# Patient Record
Sex: Female | Born: 1970
Health system: Southern US, Community
[De-identification: ages and names within clinical notes are randomized; demographics above are authoritative.]

## PROBLEM LIST (undated history)

## (undated) DIAGNOSIS — F419 Anxiety disorder, unspecified: Secondary | ICD-10-CM

## (undated) DIAGNOSIS — G43909 Migraine, unspecified, not intractable, without status migrainosus: Secondary | ICD-10-CM

## (undated) DIAGNOSIS — R188 Other ascites: Secondary | ICD-10-CM

## (undated) DIAGNOSIS — K7290 Hepatic failure, unspecified without coma: Secondary | ICD-10-CM

## (undated) DIAGNOSIS — K766 Portal hypertension: Secondary | ICD-10-CM

## (undated) DIAGNOSIS — I10 Essential (primary) hypertension: Secondary | ICD-10-CM

## (undated) DIAGNOSIS — K7682 Hepatic encephalopathy: Secondary | ICD-10-CM

## (undated) DIAGNOSIS — R569 Unspecified convulsions: Secondary | ICD-10-CM

## (undated) HISTORY — DX: Essential (primary) hypertension: I10

## (undated) HISTORY — DX: Other ascites: R18.8

## (undated) HISTORY — DX: Portal hypertension: K76.6

## (undated) HISTORY — DX: Unspecified convulsions: R56.9

---

## 1998-08-23 ENCOUNTER — Emergency Department (HOSPITAL_COMMUNITY): Admission: EM | Admit: 1998-08-23 | Discharge: 1998-08-23 | Payer: Self-pay | Admitting: Emergency Medicine

## 1998-08-23 ENCOUNTER — Encounter: Payer: Self-pay | Admitting: Emergency Medicine

## 1998-11-10 ENCOUNTER — Other Ambulatory Visit: Admission: RE | Admit: 1998-11-10 | Discharge: 1998-11-10 | Payer: Self-pay | Admitting: *Deleted

## 1999-04-21 ENCOUNTER — Inpatient Hospital Stay (HOSPITAL_COMMUNITY): Admission: AD | Admit: 1999-04-21 | Discharge: 1999-04-24 | Payer: Self-pay | Admitting: Obstetrics and Gynecology

## 1999-11-03 ENCOUNTER — Other Ambulatory Visit: Admission: RE | Admit: 1999-11-03 | Discharge: 1999-11-03 | Payer: Self-pay | Admitting: Obstetrics and Gynecology

## 2000-04-13 ENCOUNTER — Observation Stay (HOSPITAL_COMMUNITY): Admission: AD | Admit: 2000-04-13 | Discharge: 2000-04-14 | Payer: Self-pay | Admitting: Obstetrics and Gynecology

## 2000-05-10 ENCOUNTER — Inpatient Hospital Stay (HOSPITAL_COMMUNITY): Admission: AD | Admit: 2000-05-10 | Discharge: 2000-05-12 | Payer: Self-pay | Admitting: Obstetrics and Gynecology

## 2000-07-26 ENCOUNTER — Other Ambulatory Visit: Admission: RE | Admit: 2000-07-26 | Discharge: 2000-07-26 | Payer: Self-pay | Admitting: Emergency Medicine

## 2001-07-16 ENCOUNTER — Encounter: Admission: RE | Admit: 2001-07-16 | Discharge: 2001-07-16 | Payer: Self-pay

## 2002-08-31 ENCOUNTER — Emergency Department (HOSPITAL_COMMUNITY): Admission: EM | Admit: 2002-08-31 | Discharge: 2002-09-01 | Payer: Self-pay | Admitting: Emergency Medicine

## 2002-08-31 ENCOUNTER — Encounter: Payer: Self-pay | Admitting: Emergency Medicine

## 2003-07-18 ENCOUNTER — Emergency Department (HOSPITAL_COMMUNITY): Admission: EM | Admit: 2003-07-18 | Discharge: 2003-07-18 | Payer: Self-pay | Admitting: Emergency Medicine

## 2006-03-29 ENCOUNTER — Encounter: Admission: RE | Admit: 2006-03-29 | Discharge: 2006-03-29 | Payer: Self-pay | Admitting: Gastroenterology

## 2007-08-31 ENCOUNTER — Emergency Department (HOSPITAL_COMMUNITY): Admission: EM | Admit: 2007-08-31 | Discharge: 2007-08-31 | Payer: Self-pay | Admitting: Emergency Medicine

## 2008-02-21 ENCOUNTER — Emergency Department (HOSPITAL_COMMUNITY): Admission: EM | Admit: 2008-02-21 | Discharge: 2008-02-21 | Payer: Self-pay | Admitting: Emergency Medicine

## 2008-04-26 ENCOUNTER — Emergency Department (HOSPITAL_COMMUNITY): Admission: EM | Admit: 2008-04-26 | Discharge: 2008-04-26 | Payer: Self-pay | Admitting: Family Medicine

## 2008-06-15 ENCOUNTER — Inpatient Hospital Stay (HOSPITAL_COMMUNITY): Admission: AD | Admit: 2008-06-15 | Discharge: 2008-06-15 | Payer: Self-pay | Admitting: Obstetrics & Gynecology

## 2008-08-05 ENCOUNTER — Ambulatory Visit (HOSPITAL_COMMUNITY): Admission: RE | Admit: 2008-08-05 | Discharge: 2008-08-05 | Payer: Self-pay | Admitting: Obstetrics and Gynecology

## 2008-12-18 ENCOUNTER — Inpatient Hospital Stay (HOSPITAL_COMMUNITY): Admission: AD | Admit: 2008-12-18 | Discharge: 2008-12-20 | Payer: Self-pay | Admitting: Obstetrics and Gynecology

## 2008-12-30 ENCOUNTER — Inpatient Hospital Stay (HOSPITAL_COMMUNITY): Admission: AD | Admit: 2008-12-30 | Discharge: 2008-12-30 | Payer: Self-pay | Admitting: Obstetrics and Gynecology

## 2009-07-16 ENCOUNTER — Ambulatory Visit (HOSPITAL_COMMUNITY): Admission: AD | Admit: 2009-07-16 | Discharge: 2009-07-16 | Payer: Self-pay | Admitting: Obstetrics and Gynecology

## 2010-01-01 ENCOUNTER — Emergency Department (HOSPITAL_COMMUNITY): Admission: EM | Admit: 2010-01-01 | Discharge: 2010-01-01 | Payer: Self-pay | Admitting: Emergency Medicine

## 2010-07-21 LAB — COMPREHENSIVE METABOLIC PANEL
ALT: 13 U/L (ref 0–35)
AST: 21 U/L (ref 0–37)
Albumin: 2 g/dL — ABNORMAL LOW (ref 3.5–5.2)
Albumin: 2.8 g/dL — ABNORMAL LOW (ref 3.5–5.2)
Alkaline Phosphatase: 96 U/L (ref 39–117)
Alkaline Phosphatase: 75 U/L (ref 39–117)
BUN: 7 mg/dL (ref 6–23)
BUN: 7 mg/dL (ref 6–23)
CO2: 24 mEq/L (ref 19–32)
CO2: 19 mEq/L (ref 19–32)
CO2: 19 mEq/L (ref 19–32)
Calcium: 9.1 mg/dL (ref 8.4–10.5)
Calcium: 9.8 mg/dL (ref 8.4–10.5)
Chloride: 102 mEq/L (ref 96–112)
Chloride: 105 mEq/L (ref 96–112)
Creatinine, Ser: 0.73 mg/dL (ref 0.4–1.2)
Creatinine, Ser: 1.15 mg/dL (ref 0.4–1.2)
GFR calc Af Amer: >60 mL/min (ref >60)
GFR calc non Af Amer: >60 mL/min (ref >60)
GFR calc non Af Amer: 53 mL/min — ABNORMAL LOW (ref >60)
Glucose, Bld: 92 mg/dL (ref 70–99)
Potassium: 3.6 mEq/L (ref 3.5–5.1)
Potassium: 3.5 mEq/L (ref 3.5–5.1)
Potassium: 3.7 mEq/L (ref 3.5–5.1)
Sodium: 135 mEq/L (ref 135–145)
Total Bilirubin: 0.8 mg/dL (ref 0.3–1.2)
Total Protein: 6.5 g/dL (ref 6.0–8.3)
Total Protein: 4.8 g/dL — ABNORMAL LOW (ref 6.0–8.3)

## 2010-07-21 LAB — CBC
HCT: 35.3 % — ABNORMAL LOW (ref 36.0–46.0)
HCT: 36.2 % (ref 36.0–46.0)
Hemoglobin: 12.9 g/dL (ref 12.0–15.0)
Hemoglobin: 10.4 g/dL — ABNORMAL LOW (ref 12.0–15.0)
Hemoglobin: 12.4 g/dL (ref 12.0–15.0)
MCV: 99.7 fL (ref 78.0–100.0)
Platelets: 200 10*3/uL (ref 150–400)
Platelets: 204 10*3/uL (ref 150–400)
Platelets: 263 10*3/uL (ref 150–400)
RBC: 3.83 MIL/uL — ABNORMAL LOW (ref 3.87–5.11)
RBC: 2.78 MIL/uL — ABNORMAL LOW (ref 3.87–5.11)
RDW: 12.6 % (ref 11.5–15.5)
RDW: 12.6 % (ref 11.5–15.5)
RDW: 12.7 % (ref 11.5–15.5)
WBC: 8.4 10*3/uL (ref 4.0–10.5)
WBC: 13.9 10*3/uL — ABNORMAL HIGH (ref 4.0–10.5)

## 2010-07-21 LAB — URINE MICROSCOPIC-ADD ON

## 2010-07-21 LAB — RPR: RPR Ser Ql: NONREACTIVE

## 2010-07-21 LAB — DIFFERENTIAL
Eosinophils Relative: 2 % (ref 0–5)
Lymphocytes Relative: 18 % (ref 12–46)
Monocytes Absolute: 0.5 10*3/uL (ref 0.1–1.0)
Monocytes Relative: 3 % (ref 3–12)
Neutrophils Relative %: 77 % (ref 43–77)

## 2010-07-21 LAB — CREATININE CLEARANCE, URINE, 24 HOUR
Creatinine, 24H Ur: 1857 mg/d — ABNORMAL HIGH (ref 700–1800)
Creatinine: 0.89 mg/dL (ref 0.40–1.20)
Urine Total Volume-CRCL: 3600 mL

## 2010-07-21 LAB — URINALYSIS, ROUTINE W REFLEX MICROSCOPIC
Protein, ur: NEGATIVE mg/dL
Protein, ur: NEGATIVE mg/dL
Urobilinogen, UA: 0.2 mg/dL (ref 0.0–1.0)
pH: 5.5 (ref 5.0–8.0)

## 2010-07-21 LAB — URINE CULTURE

## 2010-07-21 LAB — URINALYSIS, DIPSTICK ONLY
Hgb urine dipstick: NEGATIVE
Ketones, ur: NEGATIVE mg/dL
Leukocytes, UA: NEGATIVE
Nitrite: NEGATIVE
Protein, ur: 100 mg/dL — AB
Specific Gravity, Urine: 1.01 (ref 1.005–1.030)
Specific Gravity, Urine: 1.025 (ref 1.005–1.030)
pH: 5.5 (ref 5.0–8.0)
pH: 6 (ref 5.0–8.0)

## 2010-07-21 LAB — URIC ACID
Uric Acid, Serum: 7.8 mg/dL — ABNORMAL HIGH (ref 2.4–7.0)
Uric Acid, Serum: 7.9 mg/dL — ABNORMAL HIGH (ref 2.4–7.0)

## 2010-07-21 LAB — LACTATE DEHYDROGENASE
LDH: 132 U/L (ref 94–250)
LDH: 137 U/L (ref 94–250)

## 2010-07-21 LAB — PROTEIN, URINE, 24 HOUR: Urine Total Volume-UPROT: 3600 mL

## 2010-07-26 LAB — CHROMOSOME ANALYSIS, AMNIOTIC FLUID (PERF AT WFU)

## 2010-07-27 LAB — WET PREP, GENITAL

## 2010-07-27 LAB — CBC
HCT: 36.5 % (ref 36.0–46.0)
Platelets: 244 10*3/uL (ref 150–400)
WBC: 9.6 10*3/uL (ref 4.0–10.5)

## 2010-07-27 LAB — HCG, QUANTITATIVE, PREGNANCY: hCG, Beta Chain, Quant, S: 53002 m[IU]/mL — ABNORMAL HIGH (ref <5)

## 2010-07-27 LAB — URINALYSIS, ROUTINE W REFLEX MICROSCOPIC
Bilirubin Urine: NEGATIVE
Ketones, ur: NEGATIVE mg/dL
Nitrite: NEGATIVE
Protein, ur: NEGATIVE mg/dL
pH: 5 (ref 5.0–8.0)

## 2010-07-27 LAB — POCT PREGNANCY, URINE: Preg Test, Ur: POSITIVE

## 2010-07-27 LAB — GC/CHLAMYDIA PROBE AMP, GENITAL: Chlamydia, DNA Probe: NEGATIVE

## 2010-08-29 NOTE — Op Note (Signed)
NAMEMANASA, SPEASE         ACCOUNT NO.:  [id number]   MEDICAL RECORD NO.:  [id number]          PATIENT TYPE:  OUT   LOCATION:  ULT                           FACILITY:  [facility]   PHYSICIAN:  Janine Limbo, M.D.DATE OF BIRTH:  June 04, 1970   DATE OF PROCEDURE:  DATE OF DISCHARGE:                               OPERATIVE REPORT   PREOPERATIVE DIAGNOSES:  1. A 19-week gestation.  2. Age 40.  3. Abnormal serum genetic screening.   POSTOPERATIVE DIAGNOSES:  1. A 19-week gestation.  2. Age 23.  3. Abnormal serum genetic screening.   PROCEDURE:  Genetic amniocentesis.   SURGEON:  Janine Limbo, MD   FIRST ASSISTANT:  None.   ANESTHETIC:  Local Xylocaine.   DISPOSITION:  Ms. Nusbaum is a 40 year old female, gravida 4, para 2-  0-1-2, who presents at 62 weeks' gestation.  She has been followed at  the [facility] of [facility].  She had an abnormal serum genetic screen.  We discussed the alternatives  for management.  We reviewed the risk and benefits associated with all  of her options.  The patient elects to proceed with genetic  amniocentesis.  She accepts the risks of anesthetic complications,  bleeding, infections, possible damage to the surrounding organs,  possible rupture of membranes, and possible miscarriage (1 per 200).   FINDINGS:  The patient was noted to have a single intrauterine gestation  in a cephalic presentation.  The amniotic fluid volume was normal.  There was an anterior placenta that appeared normal.  The patient's  blood type is AB positive.   PROCEDURE:  The patient was seen in the ultrasound suite and an  ultrasound was performed.  The fetal heart motions were noted to be  normal.  An appropriate fluid pocket was identified.  The patient's  abdomen was prepped with multiple layers of Betadine and then sterilely  draped.  The abdomen was injected with 1.5 mL of 1% lidocaine.  The  spinal needle was  inserted into the amniotic cavity with the assistance  of ultrasound.  A 12 mL of clear amniotic fluid were obtained.  The  spinal needle was removed, and the ultrasound was repeated.  Again,  fetal heart motions were noted to be normal.  There was no evidence of  harm.  The fluid was sent to [facility] for genetic evaluation.  The patient was given a copy of the  postoperative instruction sheet for patients who have had amniocentesis.  She was told to call for bleeding, severe pain, or any problems.  She  will follow up in the office in 1 week.      Janine Limbo, M.D.  Electronically Signed     AVS/MEDQ  D:  08/05/2008  T:  08/05/2008  Job:  [id number]
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
## 2010-09-01 NOTE — H&P (Signed)
[facility]  Patient:    Julie Rowland, Julie Rowland                MRN: [id number] Adm. Date:  [id number] Attending:  Leonard Schwartz Dictator:   Miguel Dibble, C.N.M.                         History and Physical  ADDENDUM  DATE OF BIRTH:                1970-09-02  PRENATAL LABORATORY DATA:      (At entry into the practice on November 03, 1999). Hemoglobin 13.6, hematocrit 39.7, white count 11, platelet 329.  Hepatitis surface antigen negative.  Rubella antibodies present showing immunity.  RPR nonreactive.  Blood type AB positive.  Negative antibody screen.  HIV nonreactive.  Glucose challenge test at 28 weeks within normal limits. DD:  04/13/00 TD:  04/13/00 Job: [id number]

## 2010-09-01 NOTE — Discharge Summary (Signed)
[facility]  Patient:    Julie Rowland, Julie Rowland                MRN: [id number] Adm. Date:  [id number] Disc. Date: 04/14/00 Attending:  Leonard Schwartz Dictator:   Wynelle Bourgeois, P.A.                           Discharge Summary  DATE OF BIRTH:                April 02, 1971  ADMISSION DIAGNOSES:          1. Intrauterine pregnancy at 33-4/7 weeks, with                                  nausea, vomiting, and diarrhea (probable                                  gastroenteritis).                               2. Rule out preeclampsia.  DISCHARGE DIAGNOSES:          1. Intrauterine pregnancy at 33-4/7 weeks, with                                  nausea, vomiting, and diarrhea (probable                                  gastroenteritis).                               2. Rule out preeclampsia.  PROCEDURES:                   1. IV hydration.                               2. Twenty-four-hour urine collection.                               3. Electronic fetal monitoring.  HOSPITAL COURSE:              The patient is a 40 year old G2, P1, at 33-4/7 weeks who was admitted on April 13, 2000, with nausea, vomiting, and diarrhea x 24 hours.  She was also complaining of severe mid abdominal cramps and inability to tolerate clear fluids.  She reported positive fetal movement, denied any leaking or bleeding.  She was admitted for 23-hour observation. A catheterized UA specimen showed a specific gravity of 1.030, 30 mg of protein, and greater than 80 ketones.  Blood pressures were 130/86 with pulse of 86.  She was, therefore, admitted for IV hydration and support, while obtaining a 24-hour urine for creatinine clearance and 24-hour total protein. We also sent stool for ova and parasites and culture.  PIH laboratories were sent and were found to be within normal limits.  DTRs were brisk at 3 to 4+ with no clonus.  She was  medicated for pain with Stadol 2 mg  IV push for every three hours and Phenergan as needed for nausea and Imodium as needed for diarrhea.  By April 14, 2000, her symptoms had improved, and she was able to tolerate liquids.  Bedside ultrasound showed a single intrauterine pregnancy, cephalic presentation, with normal fluid, anterior grade 2 placenta, and normal fetal motion.  On the evening of April 14, 2000, her blood pressures were 129/84, 118/83, and 104/62.  She was afebrile, without complaints.  The laboratory was contacted and notified Dr. Stefano Gaul that the 24-hour urine results would not be available tonight.  The patient was offered discharge home and explained the risks of possibility that she may have preeclampsia and that this condition may get worse and may include seizures. She and her husband understood the risks and understood what symptoms to call for or come back in for and opted to be discharged home.  Dr. Stefano Gaul will call her with results of the 24-hour urine when they are available.  DISCHARGE MEDICATIONS:        Vicodin 1 p.o. q.4h. p.r.n. pain, #30 with one refill.  DISCHARGE LABORATORY DATA:    White blood cell count 13.9, hemoglobin 11.4, hematocrit 32.0, platelets 245.  Potassium 3.4, BUN 6, creatinine 0.7, AST 23, ALT 10, ALP 102, total bilirubin 0.3, LDH 104, uric acid 5.3.  DISCHARGE INSTRUCTIONS:       Per Dr. Stefano Gaul.  FOLLOW-UP:                    In one week at [facility] or p.r.n. D:  04/14/00 TD:  04/14/00 Job: [id number]

## 2011-01-10 LAB — ETHANOL: Alcohol, Ethyl (B): <5

## 2011-01-10 LAB — POCT CARDIAC MARKERS
CKMB, poc: 2
Myoglobin, poc: 164
Operator id: 284251
Troponin i, poc: <0.05

## 2011-01-10 LAB — DIFFERENTIAL
Lymphocytes Relative: 9 — ABNORMAL LOW
Lymphs Abs: 1.4
Neutrophils Relative %: 83 — ABNORMAL HIGH

## 2011-01-10 LAB — HEPATIC FUNCTION PANEL
Albumin: 4.1
Alkaline Phosphatase: 66
Total Protein: 7.2

## 2011-01-10 LAB — CBC
Platelets: 259
WBC: 15.4 — ABNORMAL HIGH

## 2011-05-08 ENCOUNTER — Emergency Department (HOSPITAL_COMMUNITY): Payer: Self-pay

## 2011-05-08 ENCOUNTER — Encounter (HOSPITAL_COMMUNITY): Payer: Self-pay | Admitting: Emergency Medicine

## 2011-05-08 ENCOUNTER — Emergency Department (INDEPENDENT_AMBULATORY_CARE_PROVIDER_SITE_OTHER)
Admission: EM | Admit: 2011-05-08 | Discharge: 2011-05-08 | Disposition: A | Discharge status: Nursing Home (Includes ICF) | Payer: Self-pay | Source: Home / Self Care | Attending: Emergency Medicine | Admitting: Emergency Medicine

## 2011-05-08 ENCOUNTER — Encounter (HOSPITAL_COMMUNITY): Payer: Self-pay | Admitting: *Deleted

## 2011-05-08 ENCOUNTER — Emergency Department (HOSPITAL_COMMUNITY)
Admission: EM | Admit: 2011-05-08 | Discharge: 2011-05-08 | Disposition: A | Discharge status: Home / Self Care | Payer: Self-pay | Attending: Emergency Medicine | Admitting: Emergency Medicine

## 2011-05-08 DIAGNOSIS — R110 Nausea: Secondary | ICD-10-CM | POA: Insufficient documentation

## 2011-05-08 DIAGNOSIS — F411 Generalized anxiety disorder: Secondary | ICD-10-CM | POA: Insufficient documentation

## 2011-05-08 DIAGNOSIS — Z79899 Other long term (current) drug therapy: Secondary | ICD-10-CM | POA: Insufficient documentation

## 2011-05-08 DIAGNOSIS — R1033 Periumbilical pain: Secondary | ICD-10-CM | POA: Insufficient documentation

## 2011-05-08 DIAGNOSIS — F17200 Nicotine dependence, unspecified, uncomplicated: Secondary | ICD-10-CM | POA: Insufficient documentation

## 2011-05-08 DIAGNOSIS — K429 Umbilical hernia without obstruction or gangrene: Secondary | ICD-10-CM

## 2011-05-08 DIAGNOSIS — G43909 Migraine, unspecified, not intractable, without status migrainosus: Secondary | ICD-10-CM | POA: Insufficient documentation

## 2011-05-08 HISTORY — DX: Anxiety disorder, unspecified: F41.9

## 2011-05-08 HISTORY — DX: Migraine, unspecified, not intractable, without status migrainosus: G43.909

## 2011-05-08 LAB — COMPREHENSIVE METABOLIC PANEL
ALT: 105 U/L — ABNORMAL HIGH (ref 0–35)
AST: 89 U/L — ABNORMAL HIGH (ref 0–37)
Albumin: 4 g/dL (ref 3.5–5.2)
CO2: 24 mEq/L (ref 19–32)
Chloride: 102 mEq/L (ref 96–112)
GFR calc non Af Amer: >90 mL/min (ref >90)
Potassium: 3.9 mEq/L (ref 3.5–5.1)
Sodium: 138 mEq/L (ref 135–145)
Total Bilirubin: 0.4 mg/dL (ref 0.3–1.2)

## 2011-05-08 LAB — CBC
Platelets: 219 10*3/uL (ref 150–400)
RBC: 4.61 MIL/uL (ref 3.87–5.11)
WBC: 6.8 10*3/uL (ref 4.0–10.5)

## 2011-05-08 LAB — POCT URINALYSIS DIP (DEVICE)
Glucose, UA: NEGATIVE mg/dL
Leukocytes, UA: NEGATIVE
Nitrite: NEGATIVE
Specific Gravity, Urine: <=1.005 (ref 1.005–1.030)
Urobilinogen, UA: 0.2 mg/dL (ref 0.0–1.0)

## 2011-05-08 MED ORDER — IOHEXOL 300 MG/ML  SOLN
40.0000 mL | Freq: Once | INTRAMUSCULAR | Status: AC | PRN
  Administered 2011-05-08: 40 mL via ORAL

## 2011-05-08 MED ORDER — OXYCODONE-ACETAMINOPHEN 5-325 MG PO TABS: 1.0000 | ORAL_TABLET | ORAL | Status: AC | PRN

## 2011-05-08 MED ORDER — IOHEXOL 300 MG/ML  SOLN
100.0000 mL | Freq: Once | INTRAMUSCULAR | Status: AC | PRN
  Administered 2011-05-08: 100 mL via INTRAVENOUS

## 2011-05-08 MED ORDER — HYDROMORPHONE HCL PF 1 MG/ML IJ SOLN
1.0000 mg | Freq: Once | INTRAMUSCULAR | Status: AC
  Administered 2011-05-08: 1 mg via INTRAVENOUS
  Filled 2011-05-08: qty 1

## 2011-05-08 MED ORDER — PROMETHAZINE HCL 25 MG PO TABS: 25.0000 mg | ORAL_TABLET | Freq: Four times a day (QID) | ORAL | Status: AC | PRN

## 2011-05-08 MED ORDER — ONDANSETRON HCL 4 MG/2ML IJ SOLN
4.0000 mg | Freq: Once | INTRAMUSCULAR | Status: AC
  Administered 2011-05-08: 4 mg via INTRAVENOUS
  Filled 2011-05-08: qty 2

## 2011-05-08 NOTE — ED Notes (Signed)
Pt with abd pain since Sat. Pt states that her pain is just getting worse and worse.

## 2011-05-08 NOTE — ED Notes (Signed)
Pt reports  Onset 3 days ago constant  umbilical pain.   She denies fever, N, V or D.  Her last BM was today and was her normal.

## 2011-05-08 NOTE — ED Notes (Signed)
Pt from [facility] c/o umbilical pain with possible hernia x several days; pt sent from [facility] for further eval

## 2011-05-08 NOTE — ED Provider Notes (Addendum)
History     CSN: [id number]  Arrival date & time 05/08/11  1537   First MD Initiated Contact with Patient 05/08/11 1556      Chief Complaint  Patient presents with  . Abdominal Pain    (Consider location/radiation/quality/duration/timing/severity/associated sxs/prior treatment) HPI Comments: It has been 3 days, been having this severe pain, its getting much worse, "please don't touch me there", It really hurts" No fevers, No vomiting, No injury  Patient is a 41 y.o. female presenting with abdominal pain. The history is provided by the patient.  Abdominal Pain The primary symptoms of the illness include abdominal pain. The primary symptoms of the illness do not include fever, fatigue, nausea, vomiting, diarrhea, dysuria, vaginal discharge or vaginal bleeding. The current episode started more than 2 days ago. The onset of the illness was sudden. The problem has been gradually worsening.  The patient states that she believes she is currently not pregnant. Symptoms associated with the illness do not include anorexia, diaphoresis, heartburn, constipation, hematuria or frequency.    Past Medical History  Diagnosis Date  . Anxiety   . Migraine headache     History reviewed. No pertinent past surgical history.  Family History  Problem Relation Age of Onset  . Hypertension Mother   . Cancer Mother   . Diabetes Mother     History  Substance Use Topics  . Smoking status: Current Everyday Smoker -- 20 years    Types: Cigarettes  . Smokeless tobacco: Not on file  . Alcohol Use: Yes     occasionally    OB History    Grav Para Term Preterm Abortions TAB SAB Ect Mult Living                  Review of Systems  Constitutional: Negative for fever, diaphoresis and fatigue.  Respiratory: Negative for cough.   Cardiovascular: Negative for chest pain.  Gastrointestinal: Positive for abdominal pain. Negative for heartburn, nausea, vomiting, diarrhea, constipation, abdominal  distention and anorexia.  Genitourinary: Negative for dysuria, frequency, hematuria, vaginal bleeding and vaginal discharge.    Allergies  Review of patient's allergies indicates no known allergies.  Home Medications   Current Outpatient Rx  Name Route Sig Dispense Refill  . ALPRAZOLAM 1 MG PO TABS Oral Take 1 mg by mouth 3 (three) times daily as needed.    Marland Kitchen BISMUTH SUBSALICYLATE 262 MG/15ML PO SUSP Oral Take 15 mLs by mouth every 6 (six) hours as needed.    . CYCLOBENZAPRINE HCL 10 MG PO TABS Oral Take 10 mg by mouth 3 (three) times daily as needed.      BP 146/97  Pulse 88  Temp(Src) 97.5 F (36.4 C) (Oral)  Resp 16  SpO2 97%  LMP 05/04/2011  Physical Exam  Nursing note and vitals reviewed. Eyes: No scleral icterus.  Neck: Neck supple.  Abdominal: Soft. She exhibits no mass. There is tenderness in the periumbilical area. There is no rebound and no guarding. A hernia is present.    Skin: No rash noted. She is not diaphoretic.    ED Course  Procedures (including critical care time)   Labs Reviewed  POCT URINALYSIS DIP (DEVICE)  POCT URINALYSIS DIPSTICK   No results found.   No diagnosis found.    MDM  Suspected incarcerated umbilical hernia-        Jimmie Molly, MD 05/08/11 2536  Jimmie Molly, MD 05/08/11 [phone]

## 2011-05-08 NOTE — ED Provider Notes (Signed)
History     CSN: [id number]  Arrival date & time 05/08/11  1644   First MD Initiated Contact with Patient 05/08/11 2009      Chief Complaint  Patient presents with  . Abdominal Pain  . Hernia    The history is provided by the patient.   the patient reports several days of mild periumbilical abdominal pain.  She's had nausea without vomiting.  She's had no diarrhea.  She denies fevers or chills.  She denies flank pain.  She denies dysuria or urinary frequency.  She denies rash.  She reports she's never had pain like this before.  She denies vaginal discharge or vaginal bleeding at this time.  Nothing worsens her symptoms.  Nothing improves her symptoms.  Her symptoms are constant.  Her symptoms are moderate in severity.  Past Medical History  Diagnosis Date  . Anxiety   . Migraine headache     History reviewed. No pertinent past surgical history.  Family History  Problem Relation Age of Onset  . Hypertension Mother   . Cancer Mother   . Diabetes Mother     History  Substance Use Topics  . Smoking status: Current Everyday Smoker -- 20 years    Types: Cigarettes  . Smokeless tobacco: Not on file  . Alcohol Use: Yes     occasionally    OB History    Grav Para Term Preterm Abortions TAB SAB Ect Mult Living                  Review of Systems  All other systems reviewed and are negative.    Allergies  Review of patient's allergies indicates no known allergies.  Home Medications   Current Outpatient Rx  Name Route Sig Dispense Refill  . ALPRAZOLAM 1 MG PO TABS Oral Take 1 mg by mouth 3 (three) times daily as needed.    Marland Kitchen BISMUTH SUBSALICYLATE 262 MG/15ML PO SUSP Oral Take 15 mLs by mouth every 6 (six) hours as needed.    . CYCLOBENZAPRINE HCL 10 MG PO TABS Oral Take 10 mg by mouth 3 (three) times daily as needed. For headache per patient    . OXYCODONE-ACETAMINOPHEN 5-325 MG PO TABS Oral Take 1 tablet by mouth every 4 (four) hours as needed for pain. 20 tablet 0    . PROMETHAZINE HCL 25 MG PO TABS Oral Take 1 tablet (25 mg total) by mouth every 6 (six) hours as needed for nausea. 12 tablet 0    BP 145/95  Pulse 86  Temp(Src) 97.6 F (36.4 C) (Oral)  Resp 16  SpO2 95%  LMP 05/04/2011  Physical Exam  Nursing note and vitals reviewed. Constitutional: She is oriented to person, place, and time. She appears well-developed and well-nourished. No distress.  HENT:  Head: Normocephalic and atraumatic.  Eyes: EOM are normal.  Neck: Normal range of motion.  Cardiovascular: Normal rate, regular rhythm and normal heart sounds.   Pulmonary/Chest: Effort normal and breath sounds normal.  Abdominal: Soft. She exhibits no distension.       Mild umbilical tenderness on exam without guarding or rebound.  No peritonitis on exam.  No tenderness in the right upper quadrant.  No obvious hernia noted on exam  Musculoskeletal: Normal range of motion.  Neurological: She is alert and oriented to person, place, and time.  Skin: Skin is warm and dry.  Psychiatric: She has a normal mood and affect. Judgment normal.    ED Course  Procedures (including critical  care time)  Labs Reviewed  CBC - Abnormal; Notable for the following:    Hemoglobin 15.4 (*)    All other components within normal limits  COMPREHENSIVE METABOLIC PANEL - Abnormal; Notable for the following:    AST 89 (*)    ALT 105 (*)    All other components within normal limits  LIPASE, BLOOD   Ct Abdomen Pelvis W Contrast  05/08/2011  *RADIOLOGY REPORT*  Clinical Data: Persistent mid to lower abdominal pain.  CT ABDOMEN AND PELVIS WITH CONTRAST  Technique:  Multidetector CT imaging of the abdomen and pelvis was performed following the standard protocol during bolus administration of intravenous contrast.  Contrast: 100 mL of Omnipaque 300 IV contrast  Comparison: CT of the abdomen and pelvis performed 03/29/2006  Findings: Minimal bibasilar atelectasis is noted.  There is diffuse fatty infiltration  within the liver, with small areas of focal fatty sparing.  The liver is otherwise unremarkable in appearance.  The spleen is within normal limits.  The gallbladder is unremarkable.  The pancreas and adrenal glands are within normal limits.  The kidneys are unremarkable in appearance.  There is no evidence of hydronephrosis.  No renal or ureteral stones are seen.  No perinephric stranding is appreciated.  No free fluid is identified.  The small bowel is unremarkable in appearance.  The stomach is filled with contrast and is within normal limits.  No acute vascular abnormalities are seen.  The appendix is normal in caliber and contains minimal air, without evidence for appendicitis.  The colon is largely decompressed and is unremarkable in appearance.  The bladder is mildly distended and grossly unremarkable in appearance.  The uterus is within normal limits; an intrauterine device is noted in expected position at the fundus of the uterus. The ovaries are relatively symmetric; no suspicious adnexal masses are seen.  No inguinal lymphadenopathy is seen.  No acute osseous abnormalities are identified.  There is a chronic right-sided pars defect at L5, without evidence of anterolisthesis.  IMPRESSION:  1.  No acute abnormalities seen within the abdomen or pelvis. 2.  Diffuse fatty infiltration within the liver. 3.  Chronic right-sided pars defect at L5, without evidence of anterolisthesis.  Original Report Authenticated By: Tonia Ghent, M.D.     1. Abdominal pain       MDM  The patient is well-appearing.  Her urine obtained it urgent care was normal.  Her laboratory studies are without significant abnormality here.  There is a nonspecific change in her LFTs.  She'll followup with her primary care doctor regarding these.  Her CT of her abdomen and pelvis is normal.  The patient feels that she better at the time of discharge we'll follow up closely with her primary care Dr.        Lyanne Co,  MD 05/09/11 [phone]

## 2011-05-08 NOTE — ED Notes (Signed)
Patient states she has had abdominal pain x 2 days. Patient was seen at Urgent Care and was sent to [facility]. Urgent Care suspects possible umbilicus hernia. Patient denies chest pain, N/V or fever.

## 2012-10-09 ENCOUNTER — Ambulatory Visit: Payer: Self-pay | Attending: Family Medicine | Admitting: Family Medicine

## 2012-10-09 VITALS — BP 137/89 | HR 71 | Temp 98.6°F | Resp 18 | Ht 70.0 in | Wt 189.4 lb

## 2012-10-09 DIAGNOSIS — F341 Dysthymic disorder: Secondary | ICD-10-CM | POA: Insufficient documentation

## 2012-10-09 DIAGNOSIS — M545 Low back pain, unspecified: Secondary | ICD-10-CM | POA: Insufficient documentation

## 2012-10-09 DIAGNOSIS — G40909 Epilepsy, unspecified, not intractable, without status epilepticus: Secondary | ICD-10-CM | POA: Insufficient documentation

## 2012-10-09 DIAGNOSIS — I10 Essential (primary) hypertension: Secondary | ICD-10-CM | POA: Insufficient documentation

## 2012-10-09 DIAGNOSIS — F329 Major depressive disorder, single episode, unspecified: Secondary | ICD-10-CM

## 2012-10-09 DIAGNOSIS — G8929 Other chronic pain: Secondary | ICD-10-CM | POA: Insufficient documentation

## 2012-10-09 LAB — COMPREHENSIVE METABOLIC PANEL
Albumin: 4.2 g/dL (ref 3.5–5.2)
Alkaline Phosphatase: 86 U/L (ref 39–117)
BUN: 6 mg/dL (ref 6–23)
CO2: 24 mEq/L (ref 19–32)
Glucose, Bld: 85 mg/dL (ref 70–99)
Total Bilirubin: 0.7 mg/dL (ref 0.3–1.2)

## 2012-10-09 LAB — CBC
HCT: 41.5 % (ref 36.0–46.0)
Hemoglobin: 14.6 g/dL (ref 12.0–15.0)
MCV: 92.4 fL (ref 78.0–100.0)
RDW: 13.6 % (ref 11.5–15.5)
WBC: 9.6 10*3/uL (ref 4.0–10.5)

## 2012-10-09 LAB — LIPID PANEL
HDL: 67 mg/dL (ref >39)
LDL Cholesterol: 130 mg/dL — ABNORMAL HIGH (ref 0–99)
Triglycerides: 121 mg/dL (ref <150)

## 2012-10-09 LAB — TSH: TSH: 1.11 u[IU]/mL (ref 0.350–4.500)

## 2012-10-09 MED ORDER — PROPRANOLOL HCL 40 MG PO TABS: 40.0000 mg | ORAL_TABLET | Freq: Two times a day (BID) | ORAL | Status: DC

## 2012-10-09 MED ORDER — CARBAMAZEPINE 200 MG PO TABS: 200.0000 mg | ORAL_TABLET | Freq: Two times a day (BID) | ORAL | Status: DC

## 2012-10-09 MED ORDER — CYCLOBENZAPRINE HCL 10 MG PO TABS: 10.0000 mg | ORAL_TABLET | Freq: Two times a day (BID) | ORAL | Status: DC | PRN

## 2012-10-09 MED ORDER — GABAPENTIN 300 MG PO CAPS: 300.0000 mg | ORAL_CAPSULE | Freq: Two times a day (BID) | ORAL | Status: DC

## 2012-10-09 MED ORDER — HYDROCHLOROTHIAZIDE 12.5 MG PO CAPS: 12.5000 mg | ORAL_CAPSULE | Freq: Every day | ORAL | Status: DC

## 2012-10-09 NOTE — Patient Instructions (Addendum)
DASH Diet The DASH diet stands for "Dietary Approaches to Stop Hypertension." It is a healthy eating plan that has been shown to reduce high blood pressure (hypertension) in as little as 14 days, while also possibly providing other significant health benefits. These other health benefits include reducing the risk of breast cancer after menopause and reducing the risk of type 2 diabetes, heart disease, colon cancer, and stroke. Health benefits also include weight loss and slowing kidney failure in patients with chronic kidney disease.  DIET GUIDELINES  Limit salt (sodium). Your diet should contain less than 1500 mg of sodium daily.  Limit refined or processed carbohydrates. Your diet should include mostly whole grains. Desserts and added sugars should be used sparingly.  Include small amounts of heart-healthy fats. These types of fats include nuts, oils, and tub margarine. Limit saturated and trans fats. These fats have been shown to be harmful in the body. CHOOSING FOODS  The following food groups are based on a 2000 calorie diet. See your Registered Dietitian for individual calorie needs. Grains and Grain Products (6 to 8 servings daily)  Eat More Often: Whole-wheat bread, brown rice, whole-grain or wheat pasta, quinoa, popcorn without added fat or salt (air popped).  Eat Less Often: White bread, white pasta, white rice, cornbread. Vegetables (4 to 5 servings daily)  Eat More Often: Fresh, frozen, and canned vegetables. Vegetables may be raw, steamed, roasted, or grilled with a minimal amount of fat.  Eat Less Often/Avoid: Creamed or fried vegetables. Vegetables in a cheese sauce. Fruit (4 to 5 servings daily)  Eat More Often: All fresh, canned (in natural juice), or frozen fruits. Dried fruits without added sugar. One hundred percent fruit juice ( cup [237 mL] daily).  Eat Less Often: Dried fruits with added sugar. Canned fruit in light or heavy syrup. Lean Meats, Fish, and Poultry (2  servings or less daily. One serving is 3 to 4 oz [85-114 g]).  Eat More Often: Ninety percent or leaner ground beef, tenderloin, sirloin. Round cuts of beef, chicken breast, turkey breast. All fish. Grill, bake, or broil your meat. Nothing should be fried.  Eat Less Often/Avoid: Fatty cuts of meat, turkey, or chicken leg, thigh, or wing. Fried cuts of meat or fish. Dairy (2 to 3 servings)  Eat More Often: Low-fat or fat-free milk, low-fat plain or light yogurt, reduced-fat or part-skim cheese.  Eat Less Often/Avoid: Milk (whole, 2%).Whole milk yogurt. Full-fat cheeses. Nuts, Seeds, and Legumes (4 to 5 servings per week)  Eat More Often: All without added salt.  Eat Less Often/Avoid: Salted nuts and seeds, canned beans with added salt. Fats and Sweets (limited)  Eat More Often: Vegetable oils, tub margarines without trans fats, sugar-free gelatin. Mayonnaise and salad dressings.  Eat Less Often/Avoid: Coconut oils, palm oils, butter, stick margarine, cream, half and half, cookies, candy, pie. FOR MORE INFORMATION The Dash Diet Eating Plan: www.dashdiet.org Document Released: 03/22/2011 Document Revised: 06/25/2011 Document Reviewed: 03/22/2011 [employer] Patient Information 2014 [employer]. Hypertension As your heart beats, it forces blood through your arteries. This force is your blood pressure. If the pressure is too high, it is called hypertension (HTN) or high blood pressure. HTN is dangerous because you may have it and not know it. High blood pressure may mean that your heart has to work harder to pump blood. Your arteries may be narrow or stiff. The extra work puts you at risk for heart disease, stroke, and other problems.  Blood pressure consists of two numbers, a   higher number over a lower, 110/72, for example. It is stated as "110 over 72." The ideal is below 120 for the top number (systolic) and under 80 for the bottom (diastolic). Write down your blood pressure today. You  should pay close attention to your blood pressure if you have certain conditions such as:  Heart failure.  Prior heart attack.  Diabetes  Chronic kidney disease.  Prior stroke.  Multiple risk factors for heart disease. To see if you have HTN, your blood pressure should be measured while you are seated with your arm held at the level of the heart. It should be measured at least twice. A one-time elevated blood pressure reading (especially in the Emergency Department) does not mean that you need treatment. There may be conditions in which the blood pressure is different between your right and left arms. It is important to see your caregiver soon for a recheck. Most people have essential hypertension which means that there is not a specific cause. This type of high blood pressure may be lowered by changing lifestyle factors such as:  Stress.  Smoking.  Lack of exercise.  Excessive weight.  Drug/tobacco/alcohol use.  Eating less salt. Most people do not have symptoms from high blood pressure until it has caused damage to the body. Effective treatment can often prevent, delay or reduce that damage. TREATMENT  When a cause has been identified, treatment for high blood pressure is directed at the cause. There are a large number of medications to treat HTN. These fall into several categories, and your caregiver will help you select the medicines that are best for you. Medications may have side effects. You should review side effects with your caregiver. If your blood pressure stays high after you have made lifestyle changes or started on medicines,   Your medication(s) may need to be changed.  Other problems may need to be addressed.  Be certain you understand your prescriptions, and know how and when to take your medicine.  Be sure to follow up with your caregiver within the time frame advised (usually within two weeks) to have your blood pressure rechecked and to review your  medications.  If you are taking more than one medicine to lower your blood pressure, make sure you know how and at what times they should be taken. Taking two medicines at the same time can result in blood pressure that is too low. SEEK IMMEDIATE MEDICAL CARE IF:  You develop a severe headache, blurred or changing vision, or confusion.  You have unusual weakness or numbness, or a faint feeling.  You have severe chest or abdominal pain, vomiting, or breathing problems. MAKE SURE YOU:   Understand these instructions.  Will watch your condition.  Will get help right away if you are not doing well or get worse. Document Released: 04/02/2005 Document Revised: 06/25/2011 Document Reviewed: 11/21/2007 [facility] Patient Information 2014 [place], [place]. Back Exercises Back exercises help treat and prevent back injuries. The goal is to increase your strength in your belly (abdominal) and back muscles. These exercises can also help with flexibility. Start these exercises when told by your doctor. HOME CARE Back exercises include: Pelvic Tilt.  Lie on your back with your knees bent. Tilt your pelvis until the lower part of your back is against the floor. Hold this position 5 to 10 sec. Repeat this exercise 5 to 10 times. Knee to Chest.  Pull 1 knee up against your chest and hold for 20 to 30 seconds. Repeat this  with the other knee. This may be done with the other leg straight or bent, whichever feels better. Then, pull both knees up against your chest. Sit-Ups or Curl-Ups.  Bend your knees 90 degrees. Start with tilting your pelvis, and do a partial, slow sit-up. Only lift your upper half 30 to 45 degrees off the floor. Take at least 2 to 3 seonds for each sit-up. Do not do sit-ups with your knees out straight. If partial sit-ups are difficult, simply do the above but with only tightening your belly (abdominal) muscles and holding it as told. Hip-Lift.  Lie on your back with your knees flexed  90 degrees. Push down with your feet and shoulders as you raise your hips 2 inches off the floor. Hold for 10 seconds, repeat 5 to 10 times. Back Arches.  Lie on your stomach. Prop yourself up on bent elbows. Slowly press on your hands, causing an arch in your low back. Repeat 3 to 5 times. Shoulder-Lifts.  Lie face down with arms beside your body. Keep hips and belly pressed to floor as you slowly lift your head and shoulders off the floor. Do not overdo your exercises. Be careful in the beginning. Exercises may cause you some mild back discomfort. If the pain lasts for more than 15 minutes, stop the exercises until you see your doctor. Improvement with exercise for back problems is slow.  Document Released: 05/05/2010 Document Revised: 06/25/2011 Document Reviewed: 02/01/2011 [facility] Patient Information 2014 [place], [place]. Back Injury Prevention Back injuries can be extremely painful and difficult to heal. After having one back injury, you are much more likely to experience another later on. It is important to learn how to avoid injuring or re-injuring your back. The following tips can help you to prevent a back injury. PHYSICAL FITNESS  Exercise regularly and try to develop good tone in your abdominal muscles. Your abdominal muscles provide a lot of the support needed by your back.  Do aerobic exercises (walking, jogging, biking, swimming) regularly.  Do exercises that increase balance and strength (tai chi, yoga) regularly. This can decrease your risk of falling and injuring your back.  Stretch before and after exercising.  Maintain a healthy weight. The more you weigh, the more stress is placed on your back. For every pound of weight, 10 times that amount of pressure is placed on the back. DIET  Talk to your caregiver about how much calcium and vitamin D you need per day. These nutrients help to prevent weakening of the bones (osteoporosis). Osteoporosis can cause broken (fractured)  bones that lead to back pain.  Include good sources of calcium in your diet, such as dairy products, green, leafy vegetables, and products with calcium added (fortified).  Include good sources of vitamin D in your diet, such as milk and foods that are fortified with vitamin D.  Consider taking a nutritional supplement or a multivitamin if needed.  Stop smoking if you smoke. POSTURE  Sit and stand up straight. Avoid leaning forward when you sit or hunching over when you stand.  Choose chairs with good low back (lumbar) support.  If you work at a desk, sit close to your work so you do not need to lean over. Keep your chin tucked in. Keep your neck drawn back and elbows bent at a right angle. Your arms should look like the letter "L."  Sit high and close to the steering wheel when you drive. Add a lumbar support to your car seat if needed.  Avoid sitting or standing in one position for too long. Take breaks to get up, stretch, and walk around at least once every hour. Take breaks if you are driving for long periods of time.  Sleep on your side with your knees slightly bent, or sleep on your back with a pillow under your knees. Do not sleep on your stomach. LIFTING, TWISTING, AND REACHING  Avoid heavy lifting, especially repetitive lifting. If you must do heavy lifting:  Stretch before lifting.  Work slowly.  Rest between lifts.  Use carts and dollies to move objects when possible.  Make several small trips instead of carrying 1 heavy load.  Ask for help when you need it.  Ask for help when moving big, awkward objects.  Follow these steps when lifting:  Stand with your feet shoulder-width apart.  Get as close to the object as you can. Do not try to pick up heavy objects that are far from your body.  Use handles or lifting straps if they are available.  Bend at your knees. Squat down, but keep your heels off the floor.  Keep your shoulders pulled back, your chin tucked  in, and your back straight.  Lift the object slowly, tightening the muscles in your legs, abdomen, and buttocks. Keep the object as close to the center of your body as possible.  When you put a load down, use these same guidelines in reverse.  Do not:  Lift the object above your waist.  Twist at the waist while lifting or carrying a load. Move your feet if you need to turn, not your waist.  Bend over without bending at your knees.  Avoid reaching over your head, across a table, or for an object on a high surface. OTHER TIPS  Avoid wet floors and keep sidewalks clear of ice to prevent falls.  Do not sleep on a mattress that is too soft or too hard.  Keep items that are used frequently within easy reach.  Put heavier objects on shelves at waist level and lighter objects on lower or higher shelves.  Find ways to decrease your stress, such as exercise, massage, or relaxation techniques. Stress can build up in your muscles. Tense muscles are more vulnerable to injury.  Seek treatment for depression or anxiety if needed. These conditions can increase your risk of developing back pain. SEEK MEDICAL CARE IF:  You injure your back.  You have questions about diet, exercise, or other ways to prevent back injuries. MAKE SURE YOU:  Understand these instructions.  Will watch your condition.  Will get help right away if you are not doing well or get worse. Document Released: 05/10/2004 Document Revised: 06/25/2011 Document Reviewed: 05/14/2011 [facility] Patient Information 2014 [place], [place].

## 2012-10-09 NOTE — Progress Notes (Signed)
Patient here to establish care Has history of seizures and HTN

## 2012-10-09 NOTE — Progress Notes (Signed)
Patient ID: Julie Rowland, female   DOB: March 13, 1971, 42 y.o.   MRN: [id number]  CC: establish  HPI: Pt is here to establish.  Pt. Has a history of seizures but hasn't seen a neurologist yet.  Pt has some depression but is seeing psychiatrist. Pt was recently diagnosed with hypertension.    No Known Allergies Past Medical History  Diagnosis Date  . Anxiety   . Migraine headache   . Seizures   . Hypertension    Current Outpatient Prescriptions on File Prior to Visit  Medication Sig Dispense Refill  . ALPRAZolam (XANAX) 1 MG tablet Take 1 mg by mouth 3 (three) times daily as needed.      . bismuth subsalicylate (PEPTO BISMOL) 262 MG/15ML suspension Take 15 mLs by mouth every 6 (six) hours as needed.      . cyclobenzaprine (FLEXERIL) 10 MG tablet Take 10 mg by mouth 3 (three) times daily as needed. For headache per patient       No current facility-administered medications on file prior to visit.   Family History  Problem Relation Age of Onset  . Hypertension Mother   . Cancer Mother   . Diabetes Mother    History   Social History  . Marital Status: Married    Spouse Name: N/A    Number of Children: N/A  . Years of Education: N/A   Occupational History  . Not on file.   Social History Main Topics  . Smoking status: Current Every Day Smoker -- 20 years    Types: Cigarettes  . Smokeless tobacco: Not on file  . Alcohol Use: Yes     Comment: occasionally  . Drug Use: No  . Sexually Active: Yes    Birth Control/ Protection: IUD   Other Topics Concern  . Not on file   Social History Narrative  . No narrative on file    Review of Systems  Constitutional: Negative for fever, chills, diaphoresis, activity change, appetite change and fatigue.  HENT: Negative for ear pain, nosebleeds, congestion, facial swelling, rhinorrhea, neck pain, neck stiffness and ear discharge.   Eyes: Negative for pain, discharge, redness, itching and visual disturbance.  Respiratory:  Negative for cough, choking, chest tightness, shortness of breath, wheezing and stridor.   Cardiovascular: Negative for chest pain, palpitations and leg swelling.  Gastrointestinal: Negative for abdominal distention.  Genitourinary: Negative for dysuria, urgency, frequency, hematuria, flank pain, decreased urine volume, difficulty urinating and dyspareunia.  Musculoskeletal: Negative for back pain, joint swelling, arthralgias and gait problem.  Neurological: Seizures.  Negative for dizziness, tremors, syncope, facial asymmetry, speech difficulty, weakness, light-headedness, numbness and headaches.  Hematological: Negative for adenopathy. Does not bruise/bleed easily.  Psychiatric/Behavioral: Negative for hallucinations, behavioral problems, confusion, dysphoric mood, decreased concentration and agitation.    Objective:   Filed Vitals:   10/09/12 1529  BP: 137/89  Pulse: 71  Temp: 98.6 F (37 C)  Resp: 18    Physical Exam  Constitutional: Appears well-developed and well-nourished. No distress.  HENT: Normocephalic. External right and left ear normal. Oropharynx is clear and moist.  Eyes: Conjunctivae and EOM are normal. PERRLA, no scleral icterus.  Neck: Normal ROM. Neck supple. No JVD. No tracheal deviation. No thyromegaly.  CVS: RRR, S1/S2 +, no murmurs, no gallops, no carotid bruit.  Pulmonary: Effort and breath sounds normal, no stridor, rhonchi, wheezes, rales.  Abdominal: Soft. BS +,  no distension, tenderness, rebound or guarding.  Musculoskeletal: Normal range of motion. No edema and no tenderness.  Lymphadenopathy: No lymphadenopathy noted, cervical, inguinal. Neuro: Alert. Normal reflexes, muscle tone coordination. No cranial nerve deficit. Skin: Skin is warm and dry. No rash noted. Not diaphoretic. No erythema. No pallor.  Psychiatric: Normal mood and affect. Behavior, judgment, thought content normal.   Lab Results  Component Value Date   WBC 6.8 05/08/2011   HGB 15.4*  05/08/2011   HCT 43.4 05/08/2011   MCV 94.1 05/08/2011   PLT 219 05/08/2011   Lab Results  Component Value Date   CREATININE 0.76 05/08/2011   BUN 7 05/08/2011   NA 138 05/08/2011   K 3.9 05/08/2011   CL 102 05/08/2011   CO2 24 05/08/2011    No results found for this basename: HGBA1C   Lipid Panel  No results found for this basename: chol, trig, hdl, cholhdl, vldl, ldlcalc       Assessment and plan:   Epilepsy Refilled her carbamazepine and renewed gabapentin  Chronic low back pain Flexeril as needed for spasm  Hypertension Refilled propanolol and HCTZ  Anxiety Depression Pt to follow up with her psychiatrist for treatment and med refills  Check labs today  RTC in 3 months  The patient was given clear instructions to go to ER or return to medical center if symptoms don't improve, worsen or new problems develop.  The patient verbalized understanding.  The patient was told to call to get any lab results if not heard anything in the next week.    Rodney Langton, MD, CDE, FAAFP [facility] [place], [place]

## 2012-10-10 LAB — HEMOGLOBIN A1C
Hgb A1c MFr Bld: 5.4 % (ref <5.7)
Mean Plasma Glucose: 108 mg/dL (ref <117)

## 2012-10-10 NOTE — Progress Notes (Signed)
Quick Note:  Please inform patient that labs came back revealing that the cholesterol was slightly elevated but otherwise labs were okay. Recommend low-fat low-cholesterol diet and exercise. Recheck in 4 months.  Rodney Langton, MD, CDE, FAAFP [facility] [place], [place]   ______

## 2012-10-14 ENCOUNTER — Telehealth: Payer: Self-pay | Admitting: *Deleted

## 2012-10-14 NOTE — Telephone Encounter (Signed)
10/14/12 Patient made aware of lab results revealing that cholesterol was slightly elevated recommend low-fat  low-cholesterol diet And exercise. P.Marquay Kruse,RN BSN [facility]

## 2013-01-08 ENCOUNTER — Ambulatory Visit: Payer: Self-pay | Admitting: Family Medicine

## 2013-01-22 ENCOUNTER — Encounter: Payer: Self-pay | Admitting: Internal Medicine

## 2013-01-22 ENCOUNTER — Ambulatory Visit: Payer: No Typology Code available for payment source | Attending: Internal Medicine | Admitting: Internal Medicine

## 2013-01-22 VITALS — BP 149/96 | HR 82 | Temp 97.6°F | Resp 16 | Ht 69.0 in | Wt 190.0 lb

## 2013-01-22 DIAGNOSIS — F329 Major depressive disorder, single episode, unspecified: Secondary | ICD-10-CM

## 2013-01-22 DIAGNOSIS — I10 Essential (primary) hypertension: Secondary | ICD-10-CM | POA: Insufficient documentation

## 2013-01-22 DIAGNOSIS — R197 Diarrhea, unspecified: Secondary | ICD-10-CM | POA: Insufficient documentation

## 2013-01-22 NOTE — Progress Notes (Signed)
Patient ID: Julie Rowland, female   DOB: [birth year], 42 y.o.   MRN: [id number]  CC: Diarrhea  HPI: 42 year old female with past medical history of depression, hypertension who presented to clinic for her ongoing diarrhea for past one month prior to this presentation. Patient reports taking Imodium but did not significantly help with controlling diarrhea. Patient is afraid she has parasites. She reports no fevers. No abdominal pain, no nausea or vomiting. No blood in the stool.  No Known Allergies Past Medical History  Diagnosis Date  . Anxiety   . Migraine headache   . Seizures   . Hypertension    Current Outpatient Prescriptions on File Prior to Visit  Medication Sig Dispense Refill  . ALPRAZolam (XANAX) 1 MG tablet Take 1 mg by mouth 3 (three) times daily as needed.      . carbamazepine (TEGRETOL) 200 MG tablet Take 1 tablet (200 mg total) by mouth 2 (two) times daily.  60 tablet  3  . cyclobenzaprine (FLEXERIL) 10 MG tablet Take 1 tablet (10 mg total) by mouth 2 (two) times daily as needed for muscle spasms (caution will cause drowsiness). For headache per patient  45 tablet  1  . gabapentin (NEURONTIN) 300 MG capsule Take 1 capsule (300 mg total) by mouth 2 (two) times daily.  60 capsule  3  . hydrochlorothiazide (MICROZIDE) 12.5 MG capsule Take 1 capsule (12.5 mg total) by mouth daily.  30 capsule  3  . propranolol (INDERAL) 40 MG tablet Take 1 tablet (40 mg total) by mouth 2 (two) times daily.  60 tablet  3  . bismuth subsalicylate (PEPTO BISMOL) 262 MG/15ML suspension Take 15 mLs by mouth every 6 (six) hours as needed.       No current facility-administered medications on file prior to visit.   Family History  Problem Relation Age of Onset  . Hypertension Mother   . Cancer Mother   . Diabetes Mother    History   Social History  . Marital Status: Married    Spouse Name: N/A    Number of Children: N/A  . Years of Education: N/A   Occupational History  . Not on  file.   Social History Main Topics  . Smoking status: Current Every Day Smoker -- 20 years    Types: Cigarettes  . Smokeless tobacco: Not on file  . Alcohol Use: Yes     Comment: occasionally  . Drug Use: No  . Sexual Activity: Yes    Birth Control/ Protection: IUD   Other Topics Concern  . Not on file   Social History Narrative  . No narrative on file    Review of Systems  Constitutional: Negative for fever, chills, diaphoresis, activity change, appetite change and fatigue.  HENT: Negative for ear pain, nosebleeds, congestion, facial swelling, rhinorrhea, neck pain, neck stiffness and ear discharge.   Eyes: Negative for pain, discharge, redness, itching and visual disturbance.  Respiratory: Negative for cough, choking, chest tightness, shortness of breath, wheezing and stridor.   Cardiovascular: Negative for chest pain, palpitations and leg swelling.  Gastrointestinal: Negative for abdominal distention.  positive for diarrhea Genitourinary: Negative for dysuria, urgency, frequency, hematuria, flank pain, decreased urine volume, difficulty urinating and dyspareunia.  Musculoskeletal: Negative for back pain, joint swelling, arthralgias and gait problem.  Neurological: Negative for dizziness, tremors, seizures, syncope, facial asymmetry, speech difficulty, weakness, light-headedness, numbness and headaches.  Hematological: Negative for adenopathy. Does not bruise/bleed easily.  Psychiatric/Behavioral: Negative for hallucinations, behavioral problems, confusion, dysphoric  mood, decreased concentration and agitation.    Objective:   Filed Vitals:   01/22/13 1052  BP: 149/96  Pulse: 82  Temp: 97.6 F (36.4 C)  Resp: 16    Physical Exam  Constitutional: Appears well-developed and well-nourished. No distress.  HENT: Normocephalic. External right and left ear normal. Oropharynx is clear and moist.  Eyes: Conjunctivae and EOM are normal. PERRLA, no scleral icterus.  Neck:  Normal ROM. Neck supple. No JVD. No tracheal deviation. No thyromegaly.  CVS: RRR, S1/S2 +, no murmurs, no gallops, no carotid bruit.  Pulmonary: Effort and breath sounds normal, no stridor, rhonchi, wheezes, rales.  Abdominal: Soft. BS +,  no distension, tenderness, rebound or guarding.  Musculoskeletal: Normal range of motion. No edema and no tenderness.  Lymphadenopathy: No lymphadenopathy noted, cervical, inguinal. Neuro: Alert. Normal reflexes, muscle tone coordination. No cranial nerve deficit. Skin: Skin is warm and dry. No rash noted. Not diaphoretic. No erythema. No pallor.  Psychiatric: Normal mood and affect. Behavior, judgment, thought content normal.   Lab Results  Component Value Date   WBC 9.6 10/09/2012   HGB 14.6 10/09/2012   HCT 41.5 10/09/2012   MCV 92.4 10/09/2012   PLT 292 10/09/2012   Lab Results  Component Value Date   CREATININE 0.66 10/09/2012   BUN 6 10/09/2012   NA 137 10/09/2012   K 3.7 10/09/2012   CL 101 10/09/2012   CO2 24 10/09/2012    Lab Results  Component Value Date   HGBA1C 5.4 10/09/2012   Lipid Panel     Component Value Date/Time   CHOL 221* 10/09/2012 1619   TRIG 121 10/09/2012 1619   HDL 67 10/09/2012 1619   CHOLHDL 3.3 10/09/2012 1619   VLDL 24 10/09/2012 1619   LDLCALC 130* 10/09/2012 1619       Assessment and plan:   Patient Active Problem List   Diagnosis Date Noted  . Diarrhea 01/22/2013    Priority: High - Order placed for stool culture and C. difficile by PCR   . Unspecified essential hypertension 10/09/2012    Priority: Medium - We have discussed target BP range - I have advised pt to check BP regularly and to call us back if the numbers are higher than 140/90 - discussed the importance of compliance with medical therapy and diet  - Patient did not take propranolol this morning. Patient will, back in 1 week, we will recheck blood pressure and make necessary adjustments if systolic blood pressure still elevated above 140   .  Depression, anxiety  10/09/2012    Priority: Medium - Continue current medications

## 2013-01-22 NOTE — Patient Instructions (Signed)
Diarrhea Diarrhea is frequent loose and watery bowel movements. It can cause you to feel weak and dehydrated. Dehydration can cause you to become tired and thirsty, have a dry mouth, and have decreased urination that often is dark yellow. Diarrhea is a sign of another problem, most often an infection that will not last long. In most cases, diarrhea typically lasts 2 3 days. However, it can last longer if it is a sign of something more serious. It is important to treat your diarrhea as directed by your caregive to lessen or prevent future episodes of diarrhea. CAUSES  Some common causes include:  Gastrointestinal infections caused by viruses, bacteria, or parasites.  Food poisoning or food allergies.  Certain medicines, such as antibiotics, chemotherapy, and laxatives.  Artificial sweeteners and fructose.  Digestive disorders. HOME CARE INSTRUCTIONS  Ensure adequate fluid intake (hydration): have 1 cup (8 oz) of fluid for each diarrhea episode. Avoid fluids that contain simple sugars or sports drinks, fruit juices, whole milk products, and sodas. Your urine should be clear or pale yellow if you are drinking enough fluids. Hydrate with an oral rehydration solution that you can purchase at pharmacies, retail stores, and online. You can prepare an oral rehydration solution at home by mixing the following ingredients together:    tsp table salt.   tsp baking soda.   tsp salt substitute containing potassium chloride.  1  tablespoons sugar.  1 L (34 oz) of water.  Certain foods and beverages may increase the speed at which food moves through the gastrointestinal (GI) tract. These foods and beverages should be avoided and include:  Caffeinated and alcoholic beverages.  High-fiber foods, such as raw fruits and vegetables, nuts, seeds, and whole grain breads and cereals.  Foods and beverages sweetened with sugar alcohols, such as xylitol, sorbitol, and mannitol.  Some foods may be well  tolerated and may help thicken stool including:  Starchy foods, such as rice, toast, pasta, low-sugar cereal, oatmeal, grits, baked potatoes, crackers, and bagels.  Bananas.  Applesauce.  Add probiotic-rich foods to help increase healthy bacteria in the GI tract, such as yogurt and fermented milk products.  Wash your hands well after each diarrhea episode.  Only take over-the-counter or prescription medicines as directed by your caregiver.  Take a warm bath to relieve any burning or pain from frequent diarrhea episodes. SEEK IMMEDIATE MEDICAL CARE IF:   You are unable to keep fluids down.  You have persistent vomiting.  You have blood in your stool, or your stools are black and tarry.  You do not urinate in 6 8 hours, or there is only a small amount of very dark urine.  You have abdominal pain that increases or localizes.  You have weakness, dizziness, confusion, or lightheadedness.  You have a severe headache.  Your diarrhea gets worse or does not get better.  You have a fever or persistent symptoms for more than 2 3 days.  You have a fever and your symptoms suddenly get worse. MAKE SURE YOU:   Understand these instructions.  Will watch your condition.  Will get help right away if you are not doing well or get worse. Document Released: 03/23/2002 Document Revised: 03/19/2012 Document Reviewed: 12/09/2011 [employer] Patient Information 2014 [employer].  

## 2013-01-22 NOTE — Progress Notes (Signed)
Pt is here for a F/U visit. Pt reports of having  excessive diarrhea for a month.

## 2013-01-28 ENCOUNTER — Telehealth: Payer: Self-pay | Admitting: Emergency Medicine

## 2013-01-28 LAB — CLOSTRIDIUM DIFFICILE BY PCR: Toxigenic C. Difficile by PCR: NOT DETECTED

## 2013-01-28 NOTE — Telephone Encounter (Signed)
Pt given negative results for C-Diff C/o gastroenteritis. Told pt to continue taking otc Imodium and to call if sx worsens

## 2013-01-30 LAB — STOOL CULTURE

## 2013-02-09 ENCOUNTER — Ambulatory Visit: Payer: Self-pay | Admitting: Internal Medicine

## 2013-03-02 ENCOUNTER — Ambulatory Visit: Payer: Self-pay

## 2013-04-03 ENCOUNTER — Ambulatory Visit: Payer: Self-pay | Attending: Internal Medicine | Admitting: Internal Medicine

## 2013-04-03 ENCOUNTER — Encounter: Payer: Self-pay | Admitting: Internal Medicine

## 2013-04-03 VITALS — BP 149/106 | HR 91 | Temp 98.3°F | Resp 16 | Ht 69.0 in | Wt 200.0 lb

## 2013-04-03 DIAGNOSIS — R059 Cough, unspecified: Secondary | ICD-10-CM | POA: Insufficient documentation

## 2013-04-03 DIAGNOSIS — J3489 Other specified disorders of nose and nasal sinuses: Secondary | ICD-10-CM | POA: Insufficient documentation

## 2013-04-03 DIAGNOSIS — R05 Cough: Secondary | ICD-10-CM | POA: Insufficient documentation

## 2013-04-03 DIAGNOSIS — R197 Diarrhea, unspecified: Secondary | ICD-10-CM

## 2013-04-03 MED ORDER — ALBUTEROL SULFATE HFA 108 (90 BASE) MCG/ACT IN AERS: 2.0000 | INHALATION_SPRAY | Freq: Four times a day (QID) | RESPIRATORY_TRACT | Status: DC | PRN

## 2013-04-03 MED ORDER — AZITHROMYCIN 250 MG PO TABS: ORAL_TABLET | ORAL | Status: DC

## 2013-04-03 NOTE — Patient Instructions (Signed)

## 2013-04-03 NOTE — Progress Notes (Signed)
Patient ID: Julie Rowland, female   DOB: 1970-06-07, 42 y.o.   MRN: [id number]  CC: Cough and congestion  HPI: Patient is 42 year old female who presents to clinic with main concern of one week duration of productive cough of clear sputum associated with nasal congestion, subjective fevers and chills. She explains she works in [employer] and is trying to take medicine and I will stop cough as she is not able to cough at work. She denies any specific sick contacts or exposures but again mentions she works in [employer] with a lot of people. She denies chest pain, explains shortness of breath is present with exertion but not at rest and it gets worse with coughing spells. She has persistent diarrhea, non-bloody and has been under a lot of stress as she's going through divorce. No Known Allergies Past Medical History  Diagnosis Date  . Anxiety   . Migraine headache   . Seizures   . Hypertension    Current Outpatient Prescriptions on File Prior to Visit  Medication Sig Dispense Refill  . ALPRAZolam (XANAX) 1 MG tablet Take 1 mg by mouth 3 (three) times daily as needed.      . carbamazepine (TEGRETOL) 200 MG tablet Take 1 tablet (200 mg total) by mouth 2 (two) times daily.  60 tablet  3  . cyclobenzaprine (FLEXERIL) 10 MG tablet Take 1 tablet (10 mg total) by mouth 2 (two) times daily as needed for muscle spasms (caution will cause drowsiness). For headache per patient  45 tablet  1  . hydrochlorothiazide (MICROZIDE) 12.5 MG capsule Take 1 capsule (12.5 mg total) by mouth daily.  30 capsule  3  . propranolol (INDERAL) 40 MG tablet Take 1 tablet (40 mg total) by mouth 2 (two) times daily.  60 tablet  3  . bismuth subsalicylate (PEPTO BISMOL) 262 MG/15ML suspension Take 15 mLs by mouth every 6 (six) hours as needed.      . gabapentin (NEURONTIN) 300 MG capsule Take 1 capsule (300 mg total) by mouth 2 (two) times daily.  60 capsule  3   No current facility-administered medications on file  prior to visit.   Family History  Problem Relation Age of Onset  . Hypertension Mother   . Cancer Mother   . Diabetes Mother    History   Social History  . Marital Status: Married    Spouse Name: N/A    Number of Children: N/A  . Years of Education: N/A   Occupational History  . Not on file.   Social History Main Topics  . Smoking status: Current Every Day Smoker -- 20 years    Types: Cigarettes  . Smokeless tobacco: Not on file  . Alcohol Use: Yes     Comment: occasionally  . Drug Use: No  . Sexual Activity: Yes    Birth Control/ Protection: IUD   Other Topics Concern  . Not on file   Social History Narrative  . No narrative on file    Review of Systems  Constitutional: Per history of present illness  HENT: Negative for ear pain, nosebleeds, congestion, facial swelling, rhinorrhea, neck pain, neck stiffness and ear discharge.   Eyes: Negative for pain, discharge, redness, itching and visual disturbance.  Respiratory: Per history of present illness Cardiovascular: Negative for chest pain, palpitations and leg swelling.  Gastrointestinal: Negative for abdominal distention.  Genitourinary: Negative for dysuria, urgency, frequency, hematuria, flank pain, decreased urine volume, difficulty urinating and dyspareunia.  Musculoskeletal: Negative for back  pain, joint swelling, arthralgias and gait problem.  Neurological: Negative for dizziness, tremors, seizures, syncope, facial asymmetry, speech difficulty, weakness, light-headedness, numbness and headaches.  Hematological: Negative for adenopathy. Does not bruise/bleed easily.  Psychiatric/Behavioral: Negative for hallucinations, behavioral problems, confusion, dysphoric mood, decreased concentration and agitation.    Objective:   Filed Vitals:   04/03/13 0932  BP: 149/106  Pulse: 91  Temp: 98.3 F (36.8 C)  Resp: 16    Physical Exam  Constitutional: Appears well-developed and well-nourished. No distress.   HENT: Normocephalic. External right and left ear normal. Oropharynx is clear and moist.  bilateral frontal and maxillary sinus tenderness, nasal congestion noted with cough of clear sputum Eyes: Conjunctivae and EOM are normal. PERRLA, no scleral icterus.  Neck: Normal ROM. Neck supple. No JVD. No tracheal deviation. No thyromegaly.  CVS: RRR, S1/S2 +, no murmurs, no gallops, no carotid bruit.  Pulmonary: Effort and breath sounds normal, no stridor, rhonchi, wheezes, rales.  Abdominal: Soft. BS +,  no distension, tenderness, rebound or guarding.  Musculoskeletal: Normal range of motion. No edema and no tenderness.   Lab Results  Component Value Date   WBC 9.6 10/09/2012   HGB 14.6 10/09/2012   HCT 41.5 10/09/2012   MCV 92.4 10/09/2012   PLT 292 10/09/2012   Lab Results  Component Value Date   CREATININE 0.66 10/09/2012   BUN 6 10/09/2012   NA 137 10/09/2012   K 3.7 10/09/2012   CL 101 10/09/2012   CO2 24 10/09/2012    Lab Results  Component Value Date   HGBA1C 5.4 10/09/2012   Lipid Panel     Component Value Date/Time   CHOL 221* 10/09/2012 1619   TRIG 121 10/09/2012 1619   HDL 67 10/09/2012 1619   CHOLHDL 3.3 10/09/2012 1619   VLDL 24 10/09/2012 1619   LDLCALC 130* 10/09/2012 1619       Assessment and plan:   Patient Active Problem List   Diagnosis Date Noted  . Diarrhea - still present with no specific etiology elicited to date, dietary recommendations provided. GI referral  01/22/2013  . Nasal congestion - with cough productive of clear sputum. Lasting over a week now with subjective fevers and chills. We'll put order for Zithromax and will provide albuterol inhaler as needed for symptom relief. Patient advised to come back if her symptoms do not improve or get worse over the next week  10/09/2012

## 2013-04-03 NOTE — Progress Notes (Signed)
Pt is here still having constant diarrhea. For a week now pt is been feeling sick w/ constant coughing, wheezing and SOB

## 2013-04-16 HISTORY — PX: COLONOSCOPY: SHX174

## 2013-04-24 ENCOUNTER — Ambulatory Visit: Payer: Self-pay

## 2013-05-07 ENCOUNTER — Emergency Department (HOSPITAL_COMMUNITY)
Admission: EM | Admit: 2013-05-07 | Discharge: 2013-05-07 | Disposition: A | Discharge status: Home / Self Care | Payer: Self-pay | Attending: Emergency Medicine | Admitting: Emergency Medicine

## 2013-05-07 ENCOUNTER — Encounter (HOSPITAL_COMMUNITY): Payer: Self-pay | Admitting: Emergency Medicine

## 2013-05-07 DIAGNOSIS — F411 Generalized anxiety disorder: Secondary | ICD-10-CM | POA: Insufficient documentation

## 2013-05-07 DIAGNOSIS — I10 Essential (primary) hypertension: Secondary | ICD-10-CM | POA: Insufficient documentation

## 2013-05-07 DIAGNOSIS — M5430 Sciatica, unspecified side: Secondary | ICD-10-CM | POA: Insufficient documentation

## 2013-05-07 DIAGNOSIS — F17200 Nicotine dependence, unspecified, uncomplicated: Secondary | ICD-10-CM | POA: Insufficient documentation

## 2013-05-07 DIAGNOSIS — M5431 Sciatica, right side: Secondary | ICD-10-CM

## 2013-05-07 DIAGNOSIS — Z79899 Other long term (current) drug therapy: Secondary | ICD-10-CM | POA: Insufficient documentation

## 2013-05-07 DIAGNOSIS — G40909 Epilepsy, unspecified, not intractable, without status epilepticus: Secondary | ICD-10-CM | POA: Insufficient documentation

## 2013-05-07 DIAGNOSIS — Z8679 Personal history of other diseases of the circulatory system: Secondary | ICD-10-CM | POA: Insufficient documentation

## 2013-05-07 MED ORDER — OXYCODONE-ACETAMINOPHEN 5-325 MG PO TABS: 2.0000 | ORAL_TABLET | ORAL | Status: DC | PRN

## 2013-05-07 MED ORDER — PREDNISONE 20 MG PO TABS
60.0000 mg | ORAL_TABLET | Freq: Once | ORAL | Status: AC
  Administered 2013-05-07: 60 mg via ORAL
  Filled 2013-05-07: qty 3

## 2013-05-07 MED ORDER — OXYCODONE-ACETAMINOPHEN 5-325 MG PO TABS
2.0000 | ORAL_TABLET | Freq: Once | ORAL | Status: AC
  Administered 2013-05-07: 2 via ORAL
  Filled 2013-05-07: qty 2

## 2013-05-07 MED ORDER — PREDNISONE 20 MG PO TABS: ORAL_TABLET | ORAL | Status: DC

## 2013-05-07 MED ORDER — IBUPROFEN 800 MG PO TABS: 800.0000 mg | ORAL_TABLET | Freq: Three times a day (TID) | ORAL | Status: DC

## 2013-05-07 NOTE — Discharge Instructions (Signed)

## 2013-05-07 NOTE — ED Notes (Signed)
Started yesterday with lower back pain. Took Ibuprofen but today is severe and radiates down right leg. Denies numbness.

## 2013-05-07 NOTE — ED Provider Notes (Signed)
CSN: [id number]     Arrival date & time 05/07/13  1341 History   First MD Initiated Contact with Patient 05/07/13 1502     No chief complaint on file.  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  Pt states she has hip flare ups q 3-4 months but usually finds relief from Ibuprofen and walking.  She states she felt a flare coming on before work on Tuesday, took Ibuprofen, walked around at work and felt better by bedtime.  When she awoke yesterday, she had severe pain in her right hip that was worsened by movement and radiates all the way down her right leg. She denies any numbness, tingling, saddle anesthesia, history of IV drug use, loss of bowel or bladder function, or fever. She states she has not had imaging done on her hips since she was a child, and currently has no PCP, Orthopedist, or insurance.  Past Medical History  Diagnosis Date  . Anxiety   . Migraine headache   . Seizures   . Hypertension    History reviewed. No pertinent past surgical history. Family History  Problem Relation Age of Onset  . Hypertension Mother   . Cancer Mother   . Diabetes Mother    History  Substance Use Topics  . Smoking status: Current Every Day Smoker -- 20 years    Types: Cigarettes  . Smokeless tobacco: Not on file  . Alcohol Use: Yes     Comment: occasionally   OB History   Grav Para Term Preterm Abortions TAB SAB Ect Mult Living                 Review of Systems  Constitutional: Negative for fever, chills and diaphoresis.  Respiratory: Negative for chest tightness, shortness of breath and wheezing.   Cardiovascular: Negative for chest pain and palpitations.  Gastrointestinal: Negative for nausea, vomiting, abdominal pain and diarrhea.  Genitourinary: Negative for dysuria, urgency, frequency, hematuria, flank pain and difficulty urinating.   Musculoskeletal: Positive for back pain.  Neurological: Negative for syncope, weakness and numbness.    Allergies  Review of patient's allergies indicates no known allergies.  Home Medications   Current Outpatient Rx  Name  Route  Sig  Dispense  Refill  . ALPRAZolam (XANAX) 1 MG tablet   Oral   Take 1 mg by mouth 4 (four) times daily as needed for anxiety.          . carbamazepine (TEGRETOL) 200 MG tablet   Oral   Take 200 mg by mouth daily.         . cyclobenzaprine (FLEXERIL) 10 MG tablet   Oral   Take 1 tablet (10 mg total) by mouth 2 (two) times daily as needed for muscle spasms (caution will cause drowsiness). For headache per patient   45 tablet   1   . hydrochlorothiazide (MICROZIDE) 12.5 MG capsule   Oral   Take 1 capsule (12.5 mg total) by mouth daily.   30 capsule   3   . ibuprofen (ADVIL,MOTRIN) 200 MG tablet   Oral   Take 400 mg by mouth every 6 (six) hours as needed (pain).          BP 149/95  Pulse 96  Temp(Src) 97 F (36.1 C) (Oral)  SpO2 98% Physical Exam  General: Slightly Obese Female in apparent discomfort. HS: No MGR noted Lungs:  Clear to Auscultation A/P  Bilaterally Back:  No spinal tenderness to Cervical, Thoracic, or Lumbar Area. No step offs or deformities.  No para spinal tenderness. Hip:  Pelvis Stable.  Pt has small "knot"  In upper right buttock. Knee:  Full ROM.  2+ Patellar Tendon Reflex bilaterally Foot:  2+ DP pullse bilaterally. Equal strength and full ROM Neuro:  No unilateral weakness, numbness, or tingling.  Equal muscle strength bilaterally.  ED Course  Procedures (including critical care time)  3:38 PM Significant radicular pain to R leg, having difficulty bearing weight.  No focal point tenderness, no trauma to suggest acute fx/dislocation.  Is NVI.  No red flags.  Will treat for sciatica with prednisone/percocet/ibuprofen.  Care instruction provided.  Ortho referral given.  Return precaution given.  Pt voice  understanding and agrees with plan.    Labs Review Labs Reviewed - No data to display Imaging Review No results found.  EKG Interpretation   None       MDM   1. Sciatica of right side without back pain    BP 149/95  Pulse 96  Temp(Src) 97 F (36.1 C) (Oral)  SpO2 98%     Fayrene HelperBowie Madi Bonfiglio, PA-C 05/07/13 1539

## 2013-05-07 NOTE — ED Provider Notes (Signed)
  Medical screening examination/treatment/procedure(s) were performed by non-physician practitioner and as supervising physician I was immediately available for consultation/collaboration.     Maki Sweetser, MD 05/07/13 1550 

## 2013-05-18 ENCOUNTER — Ambulatory Visit: Payer: Self-pay

## 2013-05-22 ENCOUNTER — Emergency Department (HOSPITAL_COMMUNITY)
Admission: EM | Admit: 2013-05-22 | Discharge: 2013-05-22 | Discharge status: Against Medical Advice | Payer: Self-pay | Source: Home / Self Care

## 2013-05-22 ENCOUNTER — Encounter (HOSPITAL_COMMUNITY): Payer: Self-pay | Admitting: Emergency Medicine

## 2013-05-22 NOTE — ED Notes (Addendum)
Patient leaves without medical assistance.  States no one was coming to assist her.  I stated to patient that provider was coming but she still walked out Patient was not in any distress while walking

## 2013-05-22 NOTE — ED Notes (Signed)
C/o right lower back pain which radiates down her right leg States she does have sciatica pain She was seen in the ER 05/07/13 and received pain meds States she has appt with [facility]

## 2013-06-25 ENCOUNTER — Ambulatory Visit: Payer: No Typology Code available for payment source | Attending: Internal Medicine

## 2013-06-30 ENCOUNTER — Ambulatory Visit: Payer: No Typology Code available for payment source | Attending: Internal Medicine | Admitting: Internal Medicine

## 2013-06-30 VITALS — BP 156/97 | HR 102 | Temp 97.8°F

## 2013-06-30 DIAGNOSIS — G43909 Migraine, unspecified, not intractable, without status migrainosus: Secondary | ICD-10-CM | POA: Insufficient documentation

## 2013-06-30 DIAGNOSIS — I10 Essential (primary) hypertension: Secondary | ICD-10-CM | POA: Insufficient documentation

## 2013-06-30 DIAGNOSIS — R569 Unspecified convulsions: Secondary | ICD-10-CM | POA: Insufficient documentation

## 2013-06-30 DIAGNOSIS — IMO0002 Reserved for concepts with insufficient information to code with codable children: Secondary | ICD-10-CM | POA: Insufficient documentation

## 2013-06-30 DIAGNOSIS — Z79899 Other long term (current) drug therapy: Secondary | ICD-10-CM | POA: Insufficient documentation

## 2013-06-30 DIAGNOSIS — F411 Generalized anxiety disorder: Secondary | ICD-10-CM | POA: Insufficient documentation

## 2013-06-30 DIAGNOSIS — F17200 Nicotine dependence, unspecified, uncomplicated: Secondary | ICD-10-CM | POA: Insufficient documentation

## 2013-06-30 DIAGNOSIS — J209 Acute bronchitis, unspecified: Secondary | ICD-10-CM

## 2013-06-30 MED ORDER — AZITHROMYCIN 500 MG PO TABS: 500.0000 mg | ORAL_TABLET | Freq: Every day | ORAL | Status: DC

## 2013-06-30 MED ORDER — HYDROCODONE-HOMATROPINE 5-1.5 MG/5ML PO SYRP: 5.0000 mL | ORAL_SOLUTION | Freq: Three times a day (TID) | ORAL | Status: DC | PRN

## 2013-06-30 MED ORDER — ACETAMINOPHEN ER 650 MG PO TBCR: 650.0000 mg | EXTENDED_RELEASE_TABLET | Freq: Three times a day (TID) | ORAL | Status: DC | PRN

## 2013-06-30 NOTE — Progress Notes (Signed)
Patient ID: Julie Rowland, female   DOB: January 19, 1971, 43 y.o.   MRN: [id number]   CC:  HPI: Patient seen for acute bronchitis. She has had URI like symptoms since Friday. She has been taking 3-4 more times a day for body aches as well as fever. She did not measure her temperature. She has a nonproductive cough. She has some chest pain because of the cough. She denies any sick contacts. She has been hydrating herself with water and Gatorade.   No Known Allergies Past Medical History  Diagnosis Date  . Anxiety   . Migraine headache   . Seizures   . Hypertension    Current Outpatient Prescriptions on File Prior to Visit  Medication Sig Dispense Refill  . ALPRAZolam (XANAX) 1 MG tablet Take 1 mg by mouth 4 (four) times daily as needed for anxiety.       . carbamazepine (TEGRETOL) 200 MG tablet Take 200 mg by mouth daily.      . cyclobenzaprine (FLEXERIL) 10 MG tablet Take 1 tablet (10 mg total) by mouth 2 (two) times daily as needed for muscle spasms (caution will cause drowsiness). For headache per patient  45 tablet  1  . hydrochlorothiazide (MICROZIDE) 12.5 MG capsule Take 1 capsule (12.5 mg total) by mouth daily.  30 capsule  3  . oxyCODONE-acetaminophen (PERCOCET/ROXICET) 5-325 MG per tablet Take 2 tablets by mouth every 4 (four) hours as needed for severe pain.  15 tablet  0  . predniSONE (DELTASONE) 20 MG tablet 3 tabs po day one, then 2 tabs daily x 4 days  11 tablet  0   No current facility-administered medications on file prior to visit.   Family History  Problem Relation Age of Onset  . Hypertension Mother   . Cancer Mother   . Diabetes Mother    History   Social History  . Marital Status: Married    Spouse Name: N/A    Number of Children: N/A  . Years of Education: N/A   Occupational History  . Not on file.   Social History Main Topics  . Smoking status: Current Every Day Smoker -- 20 years    Types: Cigarettes  . Smokeless tobacco: Not on file  . Alcohol  Use: Yes     Comment: occasionally  . Drug Use: No  . Sexual Activity: Yes    Birth Control/ Protection: IUD   Other Topics Concern  . Not on file   Social History Narrative  . No narrative on file    Review of Systems  Constitutional: Negative for fever, chills, diaphoresis, activity change, appetite change and fatigue.  HENT: Negative for ear pain, nosebleeds, congestion, facial swelling, rhinorrhea, neck pain, neck stiffness and ear discharge.   Eyes: Negative for pain, discharge, redness, itching and visual disturbance.  Respiratory: As in history of present illness  Cardiovascular: Negative for chest pain, palpitations and leg swelling.  Gastrointestinal: Negative for abdominal distention.  Genitourinary: Negative for dysuria, urgency, frequency, hematuria, flank pain, decreased urine volume, difficulty urinating and dyspareunia.  Musculoskeletal: Negative for back pain, joint swelling, arthralgias and gait problem.  Neurological: Negative for dizziness, tremors, seizures, syncope, facial asymmetry, speech difficulty, weakness, light-headedness, numbness and headaches.  Hematological: Negative for adenopathy. Does not bruise/bleed easily.  Psychiatric/Behavioral: Negative for hallucinations, behavioral problems, confusion, dysphoric mood, decreased concentration and agitation.    Objective:   Filed Vitals:   06/30/13 1504  BP: 156/97  Pulse: 102  Temp: 97.8 F (36.6 C)  Physical Exam  Constitutional: Appears well-developed and well-nourished. No distress.  HENT: Normocephalic. External right and left ear normal. Oropharynx is clear and moist.  Eyes: Conjunctivae and EOM are normal. PERRLA, no scleral icterus.  Neck: Normal ROM. Neck supple. No JVD. No tracheal deviation. No thyromegaly.  CVS: RRR, S1/S2 +, no murmurs, no gallops, no carotid bruit.  Pulmonary: Effort and breath sounds normal, no stridor, rhonchi, wheezes, rales.  Abdominal: Soft. BS +,  no  distension, tenderness, rebound or guarding.  Musculoskeletal: Normal range of motion. No edema and no tenderness.  Lymphadenopathy: No lymphadenopathy noted, cervical, inguinal. Neuro: Alert. Normal reflexes, muscle tone coordination. No cranial nerve deficit. Skin: Skin is warm and dry. No rash noted. Not diaphoretic. No erythema. No pallor.  Psychiatric: Normal mood and affect. Behavior, judgment, thought content normal.   Lab Results  Component Value Date   WBC 9.6 10/09/2012   HGB 14.6 10/09/2012   HCT 41.5 10/09/2012   MCV 92.4 10/09/2012   PLT 292 10/09/2012   Lab Results  Component Value Date   CREATININE 0.66 10/09/2012   BUN 6 10/09/2012   NA 137 10/09/2012   K 3.7 10/09/2012   CL 101 10/09/2012   CO2 24 10/09/2012    Lab Results  Component Value Date   HGBA1C 5.4 10/09/2012   Lipid Panel     Component Value Date/Time   CHOL 221* 10/09/2012 1619   TRIG 121 10/09/2012 1619   HDL 67 10/09/2012 1619   CHOLHDL 3.3 10/09/2012 1619   VLDL 24 10/09/2012 1619   LDLCALC 130* 10/09/2012 1619       Assessment and plan:   Patient Active Problem List   Diagnosis Date Noted  . Diarrhea 01/22/2013  . Unspecified essential hypertension 10/09/2012  . Depression 10/09/2012   Acute bronchitis Patient not actively wheezing No prednisone will be prescribed Start azithromycin 500 mg x5 days Patient already has an albuterol inhaler at home but she has been advised to use Hycodan cough syrup Patient should use Tylenol instead of ibuprofen because of her history of hypertension      The patient was given clear instructions to go to ER or return to medical center if symptoms don't improve, worsen or new problems develop. The patient verbalized understanding. The patient was told to call to get any lab results if not heard anything in the next week.

## 2013-06-30 NOTE — Patient Instructions (Signed)
Patient should use Tylenol instead of ibuprofen because of her history of hypertension

## 2013-10-05 ENCOUNTER — Encounter: Payer: Self-pay | Admitting: Internal Medicine

## 2013-10-05 ENCOUNTER — Ambulatory Visit: Payer: No Typology Code available for payment source | Attending: Internal Medicine | Admitting: Internal Medicine

## 2013-10-05 VITALS — BP 146/95 | HR 97 | Temp 98.0°F | Resp 16 | Ht 69.0 in | Wt 224.4 lb

## 2013-10-05 DIAGNOSIS — R059 Cough, unspecified: Secondary | ICD-10-CM | POA: Insufficient documentation

## 2013-10-05 DIAGNOSIS — I10 Essential (primary) hypertension: Secondary | ICD-10-CM | POA: Insufficient documentation

## 2013-10-05 DIAGNOSIS — Z885 Allergy status to narcotic agent status: Secondary | ICD-10-CM | POA: Insufficient documentation

## 2013-10-05 DIAGNOSIS — Z79899 Other long term (current) drug therapy: Secondary | ICD-10-CM | POA: Insufficient documentation

## 2013-10-05 DIAGNOSIS — R05 Cough: Secondary | ICD-10-CM

## 2013-10-05 DIAGNOSIS — F17200 Nicotine dependence, unspecified, uncomplicated: Secondary | ICD-10-CM | POA: Insufficient documentation

## 2013-10-05 DIAGNOSIS — F411 Generalized anxiety disorder: Secondary | ICD-10-CM | POA: Insufficient documentation

## 2013-10-05 MED ORDER — AZITHROMYCIN 500 MG PO TABS: 500.0000 mg | ORAL_TABLET | Freq: Every day | ORAL | Status: DC

## 2013-10-05 MED ORDER — GUAIFENESIN-DM 100-10 MG/5ML PO SYRP: 5.0000 mL | ORAL_SOLUTION | ORAL | Status: DC | PRN

## 2013-10-05 MED ORDER — PREDNISONE 20 MG PO TABS: ORAL_TABLET | ORAL | Status: DC

## 2013-10-05 NOTE — Patient Instructions (Signed)
Cough, Adult  A cough is a reflex that helps clear your throat and airways. It can help heal the body or may be a reaction to an irritated airway. A cough may only last 2 or 3 weeks (acute) or may last more than 8 weeks (chronic).  CAUSES Acute cough:  Viral or bacterial infections. Chronic cough:  Infections.  Allergies.  Asthma.  Post-nasal drip.  Smoking.  Heartburn or acid reflux.  Some medicines.  Chronic lung problems (COPD).  Cancer. SYMPTOMS   Cough.  Fever.  Chest pain.  Increased breathing rate.  High-pitched whistling sound when breathing (wheezing).  Colored mucus that you cough up (sputum). TREATMENT   A bacterial cough may be treated with antibiotic medicine.  A viral cough must run its course and will not respond to antibiotics.  Your caregiver may recommend other treatments if you have a chronic cough. HOME CARE INSTRUCTIONS   Only take over-the-counter or prescription medicines for pain, discomfort, or fever as directed by your caregiver. Use cough suppressants only as directed by your caregiver.  Use a cold steam vaporizer or humidifier in your bedroom or home to help loosen secretions.  Sleep in a semi-upright position if your cough is worse at night.  Rest as needed.  Stop smoking if you smoke. SEEK IMMEDIATE MEDICAL CARE IF:   You have pus in your sputum.  Your cough starts to worsen.  You cannot control your cough with suppressants and are losing sleep.  You begin coughing up blood.  You have difficulty breathing.  You develop pain which is getting worse or is uncontrolled with medicine.  You have a fever. MAKE SURE YOU:   Understand these instructions.  Will watch your condition.  Will get help right away if you are not doing well or get worse. Document Released: 09/29/2010 Document Revised: 06/25/2011 Document Reviewed: 09/29/2010 Exit[employer] Patient Information 2015 [employer]. This information is not intended  to replace advice given to you by your health care provider. Make sure you discuss any questions you have with your health care provider.  

## 2013-10-05 NOTE — Progress Notes (Signed)
Patient here for Dry Cough, for one week.  Patient denies fever. Patient states she is coughing so hard that she is leaking urine.

## 2013-10-05 NOTE — Progress Notes (Signed)
Patient ID: Julie MerlMichelle L Rowland, female   DOB: 09/27/1970, 43 y.o.   MRN: [id number]   Julie Rowland, is a 43 y.o. female  [id number]  [id number]  DOB - 08/27/1970  Chief Complaint  Patient presents with  . Questionable Bronchitis        Subjective:   Julie Rowland is a 43 y.o. female here today for a follow up visit. Patient is known to have hypertension, generalized anxiety disorder, here today with complaints of cough, mostly dry but disturbing to the extent that she has incontinence of urine when coughing. She claims this is embarrassing  and she will likes the cough to be suppressed. 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 She denies any contact with chronic cough patient, she has no history of CHF, she denies orthopnea, paroxysmal nocturnal dyspnea, or shortness of breath. She does not remember wheezing. She denies weight loss. He smokes cigarettes heavily about one pack per day. Patient has No headache, No chest pain, No abdominal pain - No Nausea, No new weakness tingling or numbness  Problem  Cough    ALLERGIES: No Known Allergies  PAST MEDICAL HISTORY: Past Medical History  Diagnosis Date  . Anxiety   . Migraine headache   . Seizures   . Hypertension     MEDICATIONS AT HOME: Prior to Admission medications   Medication Sig Start Date End Date Taking? Authorizing Provider  ALPRAZolam Prudy Feeler(XANAX) 1 MG tablet Take 1 mg by mouth 4 (four) times daily as needed for anxiety.    Yes Historical Provider, MD  carbamazepine (TEGRETOL) 200 MG tablet Take 200 mg by mouth daily.   Yes Historical Provider, MD  cyclobenzaprine (FLEXERIL) 10 MG tablet Take 1 tablet (10 mg total) by mouth 2 (two) times daily as needed for muscle spasms (caution will cause drowsiness). For headache per patient 10/09/12  Yes Clanford Cyndie MullL Johnson, MD    hydrochlorothiazide (MICROZIDE) 12.5 MG capsule Take 1 capsule (12.5 mg total) by mouth daily. 10/09/12  Yes Clanford Cyndie MullL Johnson, MD  acetaminophen (TYLENOL 8 HOUR) 650 MG CR tablet Take 1 tablet (650 mg total) by mouth every 8 (eight) hours as needed for pain. 06/30/13   Richarda OverlieNayana Abrol, MD  azithromycin (ZITHROMAX) 500 MG tablet Take 1 tablet (500 mg total) by mouth daily. 10/05/13   Jeanann Lewandowskylugbemiga Jegede, MD  guaiFENesin-dextromethorphan (ROBITUSSIN DM) 100-10 MG/5ML syrup Take 5 mLs by mouth every 4 (four) hours as needed for cough. 10/05/13   Jeanann Lewandowskylugbemiga Jegede, MD  HYDROcodone-homatropine (HYCODAN) 5-1.5 MG/5ML syrup Take 5 mLs by mouth every 8 (eight) hours as needed for cough. 06/30/13   Richarda OverlieNayana Abrol, MD  oxyCODONE-acetaminophen (PERCOCET/ROXICET) 5-325 MG per tablet Take 2 tablets by mouth every 4 (four) hours as needed for severe pain. 05/07/13   Fayrene HelperBowie Tran, PA-C  predniSONE (DELTASONE) 20 MG tablet 3 tabs po day one, then 2 tabs daily x 4 days 10/05/13   Jeanann Lewandowskylugbemiga Jegede, MD     Objective:   Filed Vitals:   10/05/13 1514  BP: 146/95  Pulse: 97  Temp: 98 F (36.7 C)  TempSrc: Oral  Resp: 16  Height: 5\' 9"  (1.753 m)  Weight: 224 lb 6.4 oz (101.787 kg)  SpO2: 98%    Exam General appearance : Awake, alert, not in any distress. Speech Clear. Not toxic looking, obese HEENT: Atraumatic and Normocephalic, pupils equally reactive to  light and accomodation Neck: supple, no JVD. No cervical lymphadenopathy.  Chest:Good air entry bilaterally, no added sounds  CVS: S1 S2 regular, no murmurs.  Abdomen: Bowel sounds present, Non tender and not distended with no gaurding, rigidity or rebound. Extremities: B/L Lower Ext shows no edema, both legs are warm to touch Neurology: Awake alert, and oriented X 3, CN II-XII intact, Non focal Skin:No Rash Wounds:N/A  Data Review Lab Results  Component Value Date   HGBA1C 5.4 10/09/2012     Assessment & Plan   1. Unspecified essential  hypertension Continue hydrochlorothiazide We have discussed blood pressure goals and needs to be compliant with medication Patient verbalized understanding  2. Cough  - guaiFENesin-dextromethorphan (ROBITUSSIN DM) 100-10 MG/5ML syrup; Take 5 mLs by mouth every 4 (four) hours as needed for cough.  Dispense: 480 mL; Refill: 0 - predniSONE (DELTASONE) 20 MG tablet; 3 tabs po day one, then 2 tabs daily x 4 days  Dispense: 11 tablet; Refill: 0 - azithromycin (ZITHROMAX) 500 MG tablet; Take 1 tablet (500 mg total) by mouth daily.  Dispense: 5 tablet; Refill: 0  - DG Chest 2 View; Future  Patient was counseled extensively about nutrition and exercise Patient was counseled extensively on smoking cessation  Return in about 6 months (around 04/06/2014), or if symptoms worsen or fail to improve, for COPD.  The patient was given clear instructions to go to ER or return to medical center if symptoms don't improve, worsen or new problems develop. The patient verbalized understanding. The patient was told to call to get lab results if they haven't heard anything in the next week.   This note has been created with Education officer, environmentalDragon speech recognition software and smart phrase technology. Any transcriptional errors are unintentional.    Jeanann LewandowskyJEGEDE, OLUGBEMIGA, MD, MHA, FACP, FAAP [facility] [place], [place] [phone]   10/05/2013, 4:13 PM

## 2013-10-06 ENCOUNTER — Ambulatory Visit (HOSPITAL_COMMUNITY)
Admission: RE | Admit: 2013-10-06 | Discharge: 2013-10-06 | Disposition: A | Discharge status: Home / Self Care | Payer: No Typology Code available for payment source | Source: Ambulatory Visit | Attending: Internal Medicine | Admitting: Internal Medicine

## 2013-10-06 DIAGNOSIS — R05 Cough: Secondary | ICD-10-CM | POA: Insufficient documentation

## 2013-10-06 DIAGNOSIS — R059 Cough, unspecified: Secondary | ICD-10-CM | POA: Insufficient documentation

## 2013-10-19 ENCOUNTER — Telehealth: Payer: Self-pay

## 2013-10-19 NOTE — Telephone Encounter (Signed)
Message left on voice mail that her x ray was normal

## 2013-10-19 NOTE — Telephone Encounter (Signed)
Message copied by Lestine MountJUAREZ, Jahmar Mckelvy L on Mon Oct 19, 2013 11:06 AM ------      Message from: Jeanann LewandowskyJEGEDE, OLUGBEMIGA E      Created: Wed Oct 14, 2013 11:20 PM       Please inform patient that her chest x-ray is normal ------

## 2014-01-04 ENCOUNTER — Ambulatory Visit: Payer: Self-pay

## 2014-01-07 ENCOUNTER — Ambulatory Visit: Payer: Self-pay

## 2014-02-05 ENCOUNTER — Ambulatory Visit: Payer: Self-pay | Attending: Internal Medicine

## 2014-02-22 ENCOUNTER — Ambulatory Visit: Payer: Self-pay | Attending: Internal Medicine | Admitting: Internal Medicine

## 2014-02-22 ENCOUNTER — Encounter: Payer: Self-pay | Admitting: Internal Medicine

## 2014-02-22 VITALS — BP 139/75 | HR 92 | Temp 98.0°F | Resp 18 | Ht 69.0 in | Wt 238.0 lb

## 2014-02-22 DIAGNOSIS — Z30432 Encounter for removal of intrauterine contraceptive device: Secondary | ICD-10-CM | POA: Insufficient documentation

## 2014-02-22 DIAGNOSIS — R05 Cough: Secondary | ICD-10-CM

## 2014-02-22 DIAGNOSIS — G43909 Migraine, unspecified, not intractable, without status migrainosus: Secondary | ICD-10-CM | POA: Insufficient documentation

## 2014-02-22 DIAGNOSIS — J209 Acute bronchitis, unspecified: Secondary | ICD-10-CM | POA: Insufficient documentation

## 2014-02-22 DIAGNOSIS — R059 Cough, unspecified: Secondary | ICD-10-CM

## 2014-02-22 DIAGNOSIS — Z975 Presence of (intrauterine) contraceptive device: Secondary | ICD-10-CM | POA: Insufficient documentation

## 2014-02-22 MED ORDER — AZITHROMYCIN 250 MG PO TABS: ORAL_TABLET | ORAL | Status: DC

## 2014-02-22 MED ORDER — GUAIFENESIN-DM 100-10 MG/5ML PO SYRP: 5.0000 mL | ORAL_SOLUTION | ORAL | Status: DC | PRN

## 2014-02-22 NOTE — Progress Notes (Signed)
Julie Rowland Rabalais, is a 43 y.o. female  [id number]  [id number]  DOB - 10/18/1970  Chief Complaint  Patient presents with  . Follow-up  . Bronchitis  . Cough  . Referral    GYN        Subjective:   Julie Rowland Caine is a 43 y.o. female here today for a follow up visit. Patient has history of hypertension, anxiety disorder and migraine headache. Pt comes in with c/o freq cough,congestion and sore throat. Denies headache,n,v or fevers. Requesting GYN referral for IUD removal. Complaint with taking BP meds. Patient has No headache, No chest pain, No abdominal pain - No Nausea, No new weakness tingling or numbness, No Cough - SOB.  No problems updated.  ALLERGIES: No Known Allergies  PAST MEDICAL HISTORY: Past Medical History  Diagnosis Date  . Migraine headache   . Hypertension   . Anxiety   . Seizures 2014 and 2009    MEDICATIONS AT HOME: Prior to Admission medications   Medication Sig Start Date End Date Taking? Authorizing Provider  cyclobenzaprine (FLEXERIL) 10 MG tablet Take 1 tablet (10 mg total) by mouth 2 (two) times daily as needed for muscle spasms (caution will cause drowsiness). For headache per patient 10/09/12  Yes Clanford Cyndie MullL Johnson, MD  dicyclomine (BENTYL) 20 MG tablet Take 1 tablet (20 mg total) by mouth 2 (two) times daily. Patient not taking: Reported on 06/30/2014 04/12/14   Lemont Fillersatyana Kirichenko, PA-C  famotidine (PEPCID) 20 MG tablet Take 1 tablet (20 mg total) by mouth 2 (two) times daily. Patient not taking: Reported on 06/22/2014 04/12/14   Tatyana Kirichenko, PA-C  furosemide (LASIX) 40 MG tablet Take 1 tablet (40 mg total) by mouth daily. Patient not taking: Reported on 06/30/2014 06/22/14   Dessa PhiJosalyn Funches, MD  hydrochlorothiazide (HYDRODIURIL) 25 MG tablet Take 1 tablet (25 mg total) by mouth daily. 06/29/14   Quentin Angstlugbemiga E Jegede, MD  potassium chloride (K-DUR) 10 MEQ tablet Take 1 tablet (10 mEq total) by mouth daily. Patient not taking:  Reported on 07/02/2014 07/01/14   Everlene FarrierWilliam Dansie, PA-C  sucralfate (CARAFATE) 1 G tablet Take 1 tablet (1 g total) by mouth 4 (four) times daily -  with meals and at bedtime. Patient not taking: Reported on 06/30/2014 04/12/14   Tatyana Kirichenko, PA-C  traMADol (ULTRAM) 50 MG tablet Take 1 tablet (50 mg total) by mouth every 6 (six) hours as needed for moderate pain or severe pain. Patient not taking: Reported on 07/02/2014 07/01/14   Everlene FarrierWilliam Dansie, PA-C  zinc gluconate 50 MG tablet Take 50 mg by mouth daily as needed (cold).     Historical Provider, MD  zolpidem (AMBIEN) 10 MG tablet Take 10 mg by mouth at bedtime as needed for sleep.    Historical Provider, MD     Objective:   Filed Vitals:   02/22/14 1722  BP: 139/75  Pulse: 92  Temp: 98 F (36.7 C)  TempSrc: Oral  Resp: 18  Height: 5\' 9"  (1.753 m)  Weight: 238 lb (107.956 kg)  SpO2: 97%    Exam General appearance : Awake, alert, not in any distress. Speech Clear. Not toxic looking HEENT: Atraumatic and Normocephalic, pupils equally reactive to light and accomodation Neck: supple, no JVD. No cervical lymphadenopathy.  Chest:Good air entry bilaterally, no added sounds  CVS: S1 S2 regular, no murmurs.  Abdomen: Bowel sounds present, Non tender and not distended with no gaurding, rigidity or rebound. Extremities: B/L Lower Ext shows no edema, both legs are warm  to touch Neurology: Awake alert, and oriented X 3, CN II-XII intact, Non focal Skin:No Rash  Data Review Lab Results  Component Value Date   HGBA1C 5.4 10/09/2012     Assessment & Plan   1. Acute bronchitis, unspecified organism Symptomatic treatment Continue cough expectorant Avoid triggers  2. IUD (intrauterine device) in place Referred to Dr. Armen PickupFunches for IUD removal  3. Cough Symptomatic treatment   Patient has been counseled extensively about nutrition and exercise  Return in about 4 weeks (around 03/22/2014) for IUD Insertion with Dr Armen PickupFunches.  The  patient was given clear instructions to go to ER or return to medical center if symptoms don't improve, worsen or new problems develop. The patient verbalized understanding. The patient was told to call to get lab results if they haven't heard anything in the next week.   This note has been created with Education officer, environmentalDragon speech recognition software and smart phrase technology. Any transcriptional errors are unintentional.    Jeanann LewandowskyJEGEDE, OLUGBEMIGA, MD, MHA, FACP, FAAP [facility] [place], [place] [phone]

## 2014-02-22 NOTE — Patient Instructions (Signed)
Upper Respiratory Infection, Adult An upper respiratory infection (URI) is also sometimes known as the common cold. The upper respiratory tract includes the nose, sinuses, throat, trachea, and bronchi. Bronchi are the airways leading to the lungs. Most people improve within 1 week, but symptoms can last up to 2 weeks. A residual cough may last even longer.  CAUSES Many different viruses can infect the tissues lining the upper respiratory tract. The tissues become irritated and inflamed and often become very moist. Mucus production is also common. A cold is contagious. You can easily spread the virus to others by oral contact. This includes kissing, sharing a glass, coughing, or sneezing. Touching your mouth or nose and then touching a surface, which is then touched by another person, can also spread the virus. SYMPTOMS  Symptoms typically develop 1 to 3 days after you come in contact with a cold virus. Symptoms vary from person to person. They may include:  Runny nose.  Sneezing.  Nasal congestion.  Sinus irritation.  Sore throat.  Loss of voice (laryngitis).  Cough.  Fatigue.  Muscle aches.  Loss of appetite.  Headache.  Low-grade fever. DIAGNOSIS  You might diagnose your own cold based on familiar symptoms, since most people get a cold 2 to 3 times a year. Your caregiver can confirm this based on your exam. Most importantly, your caregiver can check that your symptoms are not due to another disease such as strep throat, sinusitis, pneumonia, asthma, or epiglottitis. Blood tests, throat tests, and X-rays are not necessary to diagnose a common cold, but they may sometimes be helpful in excluding other more serious diseases. Your caregiver will decide if any further tests are required. RISKS AND COMPLICATIONS  You may be at risk for a more severe case of the common cold if you smoke cigarettes, have chronic heart disease (such as heart failure) or lung disease (such as asthma), or if  you have a weakened immune system. The very young and very old are also at risk for more serious infections. Bacterial sinusitis, middle ear infections, and bacterial pneumonia can complicate the common cold. The common cold can worsen asthma and chronic obstructive pulmonary disease (COPD). Sometimes, these complications can require emergency medical care and may be life-threatening. PREVENTION  The best way to protect against getting a cold is to practice good hygiene. Avoid oral or hand contact with people with cold symptoms. Wash your hands often if contact occurs. There is no clear evidence that vitamin C, vitamin E, echinacea, or exercise reduces the chance of developing a cold. However, it is always recommended to get plenty of rest and practice good nutrition. TREATMENT  Treatment is directed at relieving symptoms. There is no cure. Antibiotics are not effective, because the infection is caused by a virus, not by bacteria. Treatment may include:  Increased fluid intake. Sports drinks offer valuable electrolytes, sugars, and fluids.  Breathing heated mist or steam (vaporizer or shower).  Eating chicken soup or other clear broths, and maintaining good nutrition.  Getting plenty of rest.  Using gargles or lozenges for comfort.  Controlling fevers with ibuprofen or acetaminophen as directed by your caregiver.  Increasing usage of your inhaler if you have asthma. Zinc gel and zinc lozenges, taken in the first 24 hours of the common cold, can shorten the duration and lessen the severity of symptoms. Pain medicines may help with fever, muscle aches, and throat pain. A variety of non-prescription medicines are available to treat congestion and runny nose. Your caregiver   can make recommendations and may suggest nasal or lung inhalers for other symptoms.  HOME CARE INSTRUCTIONS   Only take over-the-counter or prescription medicines for pain, discomfort, or fever as directed by your  caregiver.  Use a warm mist humidifier or inhale steam from a shower to increase air moisture. This may keep secretions moist and make it easier to breathe.  Drink enough water and fluids to keep your urine clear or pale yellow.  Rest as needed.  Return to work when your temperature has returned to normal or as your caregiver advises. You may need to stay home longer to avoid infecting others. You can also use a face mask and careful hand washing to prevent spread of the virus. SEEK MEDICAL CARE IF:   After the first few days, you feel you are getting worse rather than better.  You need your caregiver's advice about medicines to control symptoms.  You develop chills, worsening shortness of breath, or brown or red sputum. These may be signs of pneumonia.  You develop yellow or brown nasal discharge or pain in the face, especially when you bend forward. These may be signs of sinusitis.  You develop a fever, swollen neck glands, pain with swallowing, or white areas in the back of your throat. These may be signs of strep throat. SEEK IMMEDIATE MEDICAL CARE IF:   You have a fever.  You develop severe or persistent headache, ear pain, sinus pain, or chest pain.  You develop wheezing, a prolonged cough, cough up blood, or have a change in your usual mucus (if you have chronic lung disease).  You develop sore muscles or a stiff neck. Document Released: 09/26/2000 Document Revised: 06/25/2011 Document Reviewed: 07/08/2013 [employer] Patient Information 2015 [employer]. This information is not intended to replace advice given to you by your health care provider. Make sure you discuss any questions you have with your health care provider.  

## 2014-03-22 ENCOUNTER — Ambulatory Visit: Payer: Self-pay | Attending: Family Medicine | Admitting: Family Medicine

## 2014-03-22 ENCOUNTER — Encounter: Payer: Self-pay | Admitting: Family Medicine

## 2014-03-22 VITALS — BP 151/83 | HR 96 | Temp 98.1°F | Resp 18 | Ht 69.0 in | Wt 243.0 lb

## 2014-03-22 DIAGNOSIS — Z299 Encounter for prophylactic measures, unspecified: Secondary | ICD-10-CM

## 2014-03-22 DIAGNOSIS — N951 Menopausal and female climacteric states: Secondary | ICD-10-CM

## 2014-03-22 DIAGNOSIS — Z30433 Encounter for removal and reinsertion of intrauterine contraceptive device: Secondary | ICD-10-CM | POA: Insufficient documentation

## 2014-03-22 LAB — POCT URINE PREGNANCY: PREG TEST UR: NEGATIVE

## 2014-03-22 MED ORDER — LEVONORGESTREL 20 MCG/24HR IU IUD: INTRAUTERINE_SYSTEM | Freq: Once | INTRAUTERINE | Status: DC

## 2014-03-22 NOTE — Patient Instructions (Addendum)
[name],  Thank you for coming in today. Take ibuprofen tonight for cramping.  You will be called with lab results.   F/u in one week with me  for IUD string check and BP f/u.   Dr. Armen PickupFunches    Intrauterine Device Insertion, Care After Refer to this sheet in the next few weeks. These instructions provide you with information on caring for yourself after your procedure. Your health care provider may also give you more specific instructions. Your treatment has been planned according to current medical practices, but problems sometimes occur. Call your health care provider if you have any problems or questions after your procedure. WHAT TO EXPECT AFTER THE PROCEDURE Insertion of the IUD may cause some discomfort, such as cramping. The cramping should improve after the IUD is in place. You may have bleeding after the procedure. This is normal. It varies from light spotting for a few days to menstrual-like bleeding. When the IUD is in place, a string will extend past the cervix into the vagina for 1-2 inches. The strings should not bother you or your partner. If they do, talk to your health care provider.  HOME CARE INSTRUCTIONS   Check your intrauterine device (IUD) to make sure it is in place before you resume sexual activity. You should be able to feel the strings. If you cannot feel the strings, something may be wrong. The IUD may have fallen out of the uterus, or the uterus may have been punctured (perforated) during placement. Also, if the strings are getting longer, it may mean that the IUD is being forced out of the uterus. You no longer have full protection from pregnancy if any of these problems occur.  You may resume sexual intercourse if you are not having problems with the IUD. The copper IUD is considered immediately effective, and the hormone IUD works right away if inserted within 7 days of your period starting. You will need to use a backup method of birth control for 7 days  if the IUD in inserted at any other time in your cycle.  Continue to check that the IUD is still in place by feeling for the strings after every menstrual period.  You may need to take pain medicine such as acetaminophen or ibuprofen. Only take medicines as directed by your health care provider. SEEK MEDICAL CARE IF:   You have bleeding that is heavier or lasts longer than a normal menstrual cycle.  You have a fever.  You have increasing cramps or abdominal pain not relieved with medicine.  You have abdominal pain that does not seem to be related to the same area of earlier cramping and pain.  You are lightheaded, unusually weak, or faint.  You have abnormal vaginal discharge or smells.  You have pain during sexual intercourse.  You cannot feel the IUD strings, or the IUD string has gotten longer.  You feel the IUD at the opening of the cervix in the vagina.  You think you are pregnant, or you miss your menstrual period.  The IUD string is hurting your sex partner. MAKE SURE YOU:  Understand these instructions.  Will watch your condition.  Will get help right away if you are not doing well or get worse. Document Released: 11/29/2010 Document Revised: 01/21/2013 Document Reviewed: 09/21/2012 [facility] Patient Information 2015 [place], [place]. This information is not intended to replace advice given to you by your health care provider. Make sure you discuss any questions you have with your health care  provider.  

## 2014-03-22 NOTE — Progress Notes (Signed)
IUD Insertion Procedure Note  Pre-operative Diagnosis: IUD removal and reinsertion   Post-operative Diagnosis: same  Indications: contraception  Procedure Details  Urine pregnancy test was done today and result was negative.  The risks (including infection, bleeding, pain, and uterine perforation) and benefits of the procedure were explained to the patient and Written informed consent was obtained.    Cervix cleansed with Betadine. Uterus sounded to 7 cm. IUD inserted without difficulty. String visible and trimmed to 4 cm. Patient tolerated procedure well.  IUD Information: Mirena, Lot # [id number] Exp 06/18.  Condition: Stable  Complications: None  Plan:  The patient was advised to call for any fever or for prolonged or severe pain or bleeding. She was advised to use OTC ibuprofen as needed for mild to moderate pain.  F/u in one week

## 2014-03-22 NOTE — Progress Notes (Signed)
Removing IUD and place a new IUD

## 2014-03-22 NOTE — Assessment & Plan Note (Signed)
A: IUD in place desires removal and reinsertion P: IUD inserted The patient was advised to call for any fever or for prolonged or severe pain or bleeding. She was advised to use OTC ibuprofen as needed for mild to moderate pain.  F/u in one week

## 2014-03-26 ENCOUNTER — Other Ambulatory Visit: Payer: Self-pay | Admitting: Family Medicine

## 2014-03-29 ENCOUNTER — Ambulatory Visit: Payer: Self-pay | Attending: Family Medicine | Admitting: Family Medicine

## 2014-03-29 ENCOUNTER — Encounter: Payer: Self-pay | Admitting: Family Medicine

## 2014-03-29 VITALS — BP 157/89 | HR 98 | Temp 98.3°F | Resp 16 | Ht 69.0 in | Wt 241.0 lb

## 2014-03-29 DIAGNOSIS — N951 Menopausal and female climacteric states: Secondary | ICD-10-CM

## 2014-03-29 DIAGNOSIS — Z780 Asymptomatic menopausal state: Secondary | ICD-10-CM | POA: Insufficient documentation

## 2014-03-29 DIAGNOSIS — Z30431 Encounter for routine checking of intrauterine contraceptive device: Secondary | ICD-10-CM | POA: Insufficient documentation

## 2014-03-29 DIAGNOSIS — F17200 Nicotine dependence, unspecified, uncomplicated: Secondary | ICD-10-CM | POA: Insufficient documentation

## 2014-03-29 DIAGNOSIS — Z975 Presence of (intrauterine) contraceptive device: Secondary | ICD-10-CM

## 2014-03-29 DIAGNOSIS — I10 Essential (primary) hypertension: Secondary | ICD-10-CM

## 2014-03-29 MED ORDER — HYDROCHLOROTHIAZIDE 25 MG PO TABS: 25.0000 mg | ORAL_TABLET | Freq: Every day | ORAL | Status: DC

## 2014-03-29 NOTE — Assessment & Plan Note (Signed)
A: BP elevated, Meds:complaint P: Increase HCTZ to 25 mg daily from 12.5 mg  F/u with Dr. Hyman HopesJegede in 4-6 weeks

## 2014-03-29 NOTE — Progress Notes (Signed)
   Subjective:    Patient ID: Julie Rowland, female    DOB: 07/18/1970, 43 y.o.   MRN: [id number] CC: f/u IUD placement f/u string check, HTN f/u  HPI 43 yo F presents for f/u visit:  1. IUD: in placed. No cramping. No bleeding. Cannot recall LMP b/c this is third IUD in a row. Feels like she is perimenopausal.   2. HTN: taking HCTZ 12.5 mg daily. No CP or SOB. Some lightheadedness when bending forward.   Soc Hx: current smoker  Review of Systems As per HPI     Objective:   Physical Exam BP 157/89 mmHg  Pulse 98  Temp(Src) 98.3 F (36.8 C) (Oral)  Resp 16  Ht 5\' 9"  (1.753 m)  Wt 241 lb (109.317 kg)  BMI 35.57 kg/m2  SpO2 97% General appearance: alert, cooperative and no distress Pelvic: speculum exam IUD strings in place       Assessment & Plan:

## 2014-03-29 NOTE — Assessment & Plan Note (Signed)
FSH and LH drawn today

## 2014-03-29 NOTE — Patient Instructions (Signed)
Mrs. Julie Rowland,  Thank you for coming in today.  1. IUD strings in placed  2. You will be called with results of hormone levels.  3. HTN:  Increase HCTZ to 25 mg daily from 12.5 mg    F/u with Dr. Hyman HopesJegede in 6-8 weeks for HTN

## 2014-03-29 NOTE — Assessment & Plan Note (Signed)
IUD string check done and normal

## 2014-03-29 NOTE — Progress Notes (Signed)
F/U IUD Stated cramping for 3 day

## 2014-03-30 LAB — FSH/LH
FSH: 10.2 m[IU]/mL
LH: 8.7 m[IU]/mL

## 2014-04-12 ENCOUNTER — Emergency Department (HOSPITAL_COMMUNITY): Payer: Self-pay

## 2014-04-12 ENCOUNTER — Encounter (HOSPITAL_COMMUNITY): Payer: Self-pay | Admitting: Family Medicine

## 2014-04-12 ENCOUNTER — Emergency Department (HOSPITAL_COMMUNITY)
Admission: EM | Admit: 2014-04-12 | Discharge: 2014-04-12 | Disposition: A | Discharge status: Home / Self Care | Payer: Self-pay | Attending: Emergency Medicine | Admitting: Emergency Medicine

## 2014-04-12 DIAGNOSIS — I10 Essential (primary) hypertension: Secondary | ICD-10-CM | POA: Insufficient documentation

## 2014-04-12 DIAGNOSIS — R109 Unspecified abdominal pain: Secondary | ICD-10-CM

## 2014-04-12 DIAGNOSIS — Z720 Tobacco use: Secondary | ICD-10-CM | POA: Insufficient documentation

## 2014-04-12 DIAGNOSIS — R748 Abnormal levels of other serum enzymes: Secondary | ICD-10-CM

## 2014-04-12 DIAGNOSIS — F419 Anxiety disorder, unspecified: Secondary | ICD-10-CM | POA: Insufficient documentation

## 2014-04-12 DIAGNOSIS — Z79899 Other long term (current) drug therapy: Secondary | ICD-10-CM | POA: Insufficient documentation

## 2014-04-12 DIAGNOSIS — G43909 Migraine, unspecified, not intractable, without status migrainosus: Secondary | ICD-10-CM | POA: Insufficient documentation

## 2014-04-12 DIAGNOSIS — K760 Fatty (change of) liver, not elsewhere classified: Secondary | ICD-10-CM | POA: Insufficient documentation

## 2014-04-12 DIAGNOSIS — R1013 Epigastric pain: Secondary | ICD-10-CM | POA: Insufficient documentation

## 2014-04-12 DIAGNOSIS — R7989 Other specified abnormal findings of blood chemistry: Secondary | ICD-10-CM | POA: Insufficient documentation

## 2014-04-12 DIAGNOSIS — Z3202 Encounter for pregnancy test, result negative: Secondary | ICD-10-CM | POA: Insufficient documentation

## 2014-04-12 LAB — COMPREHENSIVE METABOLIC PANEL
ALBUMIN: 3 g/dL — AB (ref 3.5–5.2)
ALK PHOS: 128 U/L — AB (ref 39–117)
ALT: 47 U/L — AB (ref 0–35)
AST: 114 U/L — AB (ref 0–37)
Anion gap: 9 (ref 5–15)
BILIRUBIN TOTAL: 1 mg/dL (ref 0.3–1.2)
BUN: <5 mg/dL — ABNORMAL LOW (ref 6–23)
CO2: 22 mmol/L (ref 19–32)
Calcium: 8.6 mg/dL (ref 8.4–10.5)
Chloride: 103 mEq/L (ref 96–112)
Creatinine, Ser: 0.64 mg/dL (ref 0.50–1.10)
GFR calc Af Amer: >90 mL/min (ref >90)
GFR calc non Af Amer: >90 mL/min (ref >90)
Glucose, Bld: 103 mg/dL — ABNORMAL HIGH (ref 70–99)
POTASSIUM: 3.6 mmol/L (ref 3.5–5.1)
Sodium: 134 mmol/L — ABNORMAL LOW (ref 135–145)
Total Protein: 6.8 g/dL (ref 6.0–8.3)

## 2014-04-12 LAB — CBC WITH DIFFERENTIAL/PLATELET
BASOS ABS: 0 10*3/uL (ref 0.0–0.1)
Basophils Relative: 0 % (ref 0–1)
Eosinophils Absolute: 0.2 10*3/uL (ref 0.0–0.7)
Eosinophils Relative: 2 % (ref 0–5)
HEMATOCRIT: 42.1 % (ref 36.0–46.0)
Hemoglobin: 14.2 g/dL (ref 12.0–15.0)
LYMPHS PCT: 28 % (ref 12–46)
Lymphs Abs: 2 10*3/uL (ref 0.7–4.0)
MCH: 34.8 pg — ABNORMAL HIGH (ref 26.0–34.0)
MCHC: 33.7 g/dL (ref 30.0–36.0)
MCV: 103.2 fL — ABNORMAL HIGH (ref 78.0–100.0)
Monocytes Absolute: 0.6 10*3/uL (ref 0.1–1.0)
Monocytes Relative: 9 % (ref 3–12)
NEUTROS ABS: 4.2 10*3/uL (ref 1.7–7.7)
Neutrophils Relative %: 61 % (ref 43–77)
Platelets: 164 10*3/uL (ref 150–400)
RBC: 4.08 MIL/uL (ref 3.87–5.11)
RDW: 13 % (ref 11.5–15.5)
WBC: 6.9 10*3/uL (ref 4.0–10.5)

## 2014-04-12 LAB — URINALYSIS, ROUTINE W REFLEX MICROSCOPIC
BILIRUBIN URINE: NEGATIVE
Glucose, UA: NEGATIVE mg/dL
Hgb urine dipstick: NEGATIVE
KETONES UR: NEGATIVE mg/dL
Leukocytes, UA: NEGATIVE
Nitrite: NEGATIVE
PH: 6 (ref 5.0–8.0)
Protein, ur: NEGATIVE mg/dL
Specific Gravity, Urine: 1.002 — ABNORMAL LOW (ref 1.005–1.030)
Urobilinogen, UA: 0.2 mg/dL (ref 0.0–1.0)

## 2014-04-12 LAB — LIPASE, BLOOD: Lipase: 35 U/L (ref 11–59)

## 2014-04-12 LAB — PREGNANCY, URINE: PREG TEST UR: NEGATIVE

## 2014-04-12 MED ORDER — DICYCLOMINE HCL 20 MG PO TABS: 20.0000 mg | ORAL_TABLET | Freq: Two times a day (BID) | ORAL | Status: DC

## 2014-04-12 MED ORDER — HYDROMORPHONE HCL 1 MG/ML IJ SOLN
0.5000 mg | Freq: Once | INTRAMUSCULAR | Status: AC
Start: 2014-04-12 — End: 2014-04-12
  Administered 2014-04-12: 0.5 mg via INTRAVENOUS
  Filled 2014-04-12: qty 1

## 2014-04-12 MED ORDER — FAMOTIDINE 20 MG PO TABS: 20.0000 mg | ORAL_TABLET | Freq: Two times a day (BID) | ORAL | Status: DC

## 2014-04-12 MED ORDER — SUCRALFATE 1 G PO TABS: 1.0000 g | ORAL_TABLET | Freq: Three times a day (TID) | ORAL | Status: DC

## 2014-04-12 MED ORDER — ONDANSETRON HCL 4 MG/2ML IJ SOLN
4.0000 mg | Freq: Once | INTRAMUSCULAR | Status: AC
  Administered 2014-04-12: 4 mg via INTRAVENOUS
  Filled 2014-04-12: qty 2

## 2014-04-12 NOTE — ED Notes (Signed)
Pt having severe upper abd pain. sts she is very bloated and all x 3 weeks. sts that she recently had an IUD inserted. Denies N,V,D.

## 2014-04-12 NOTE — Discharge Instructions (Signed)
Start taking Pepcid and Carafate for possible stomach irritation. Take Bentyl for pain. Take NSAIDs, do not take any more Tylenol. Please follow-up with your doctor for further evaluation of the liver abnormality. Return if her symptoms are worsening.   Fatty Liver Fatty liver is the accumulation of fat in liver cells. It is also called hepatosteatosis or steatohepatitis. It is normal for your liver to contain some fat. If fat is more than 5 to 10% of your liver's weight, you have fatty liver.  There are often no symptoms (problems) for years while damage is still occurring. People often learn about their fatty liver when they have medical tests for other reasons. Fat can damage your liver for years or even decades without causing problems. When it becomes severe, it can cause fatigue, weight loss, weakness, and confusion. This makes you more likely to develop more serious liver problems. The liver is the largest organ in the body. It does a lot of work and often gives no warning signs when it is sick until late in a disease. The liver has many important jobs including:  Breaking down foods.  Storing vitamins, iron, and other minerals.  Making proteins.  Making bile for food digestion.  Breaking down many products including medications, alcohol and some poisons. CAUSES  There are a number of different conditions, medications, and poisons that can cause a fatty liver. Eating too many calories causes fat to build up in the liver. Not processing and breaking fats down normally may also cause this. Certain conditions, such as obesity, diabetes, and high triglycerides also cause this. Most fatty liver patients tend to be middle-aged and over weight.  Some causes of fatty liver are:  Alcohol over consumption.  Malnutrition.  Steroid use.  Valproic acid toxicity.  Obesity.  Cushing's syndrome.  Poisons.  Tetracycline in high dosages.  Pregnancy.  Diabetes.  Hyperlipidemia.  Rapid  weight loss. Some people develop fatty liver even having none of these conditions. SYMPTOMS  Fatty liver most often causes no problems. This is called asymptomatic.  It can be diagnosed with blood tests and also by a liver biopsy.  It is one of the most common causes of minor elevations of liver enzymes on routine blood tests.  Specialized Imaging of the liver using ultrasound, CT (computed tomography) scan, or MRI (magnetic resonance imaging) can suggest a fatty liver but a biopsy is needed to confirm it.  A biopsy involves taking a small sample of liver tissue. This is done by using a needle. It is then looked at under a microscope by a specialist. TREATMENT  It is important to treat the cause. Simple fatty liver without a medical reason may not need treatment.  Weight loss, fat restriction, and exercise in overweight patients produces inconsistent results but is worth trying.  Fatty liver due to alcohol toxicity may not improve even with stopping drinking.  Good control of diabetes may reduce fatty liver.  Lower your triglycerides through diet, medication or both.  Eat a balanced, healthy diet.  Increase your physical activity.  Get regular checkups from a liver specialist.  There are no medical or surgical treatments for a fatty liver or NASH, but improving your diet and increasing your exercise may help prevent or reverse some of the damage. PROGNOSIS  Fatty liver may cause no damage or it can lead to an inflammation of the liver. This is, called steatohepatitis. When it is linked to alcohol abuse, it is called alcoholic steatohepatitis. It often is not  linked to alcohol. It is then called nonalcoholic steatohepatitis, or NASH. Over time the liver may become scarred and hardened. This condition is called cirrhosis. Cirrhosis is serious and may lead to liver failure or cancer. NASH is one of the leading causes of cirrhosis. About 10-20% of Americans have fatty liver and a smaller  2-5% has NASH. Document Released: 05/18/2005 Document Revised: 06/25/2011 Document Reviewed: 08/12/2013 [facility] Patient Information 2015 [place]ExitCare, [place]. This information is not intended to replace advice given to you by your health care provider. Make sure you discuss any questions you have with your health care provider.

## 2014-04-12 NOTE — ED Provider Notes (Signed)
CSN: [id number]     Arrival date & time 04/12/14  1214 History   First MD Initiated Contact with Patient 04/12/14 1552     Chief Complaint  Patient presents with  . Abdominal Pain     (Consider location/radiation/quality/duration/timing/severity/associated sxs/prior Treatment) HPI Julie Rowland is a 43 y.o. female with history of seizures, migraines, hypertension, and anxiety, presents to emergency department with upper abdominal pain. Patient states that her pain began approximately 3 weeks ago. States at that time she had a new IUD placed. She states that she did go back for recheck, was told that her IUD was in the correct place. She states her pain is mainly in the upper abdomen. It does not radiate. She denies any nausea or vomiting. No changes in her bowels. She states pain is worse with eating, but also is there when she is not eating. She denies fever or chills. No vaginal discharge or bleeding. No medications tried for the pain. No history of the same. No prior abdominal surgeries. She admits to drinking alcohol but only on occasion.  Past Medical History  Diagnosis Date  . Anxiety   . Migraine headache   . Seizures   . Hypertension    History reviewed. No pertinent past surgical history. Family History  Problem Relation Age of Onset  . Hypertension Mother   . Cancer Mother   . Diabetes Mother    History  Substance Use Topics  . Smoking status: Current Every Day Smoker -- 20 years    Types: Cigarettes  . Smokeless tobacco: Not on file     Comment: cut back to 4-5 cigarettes per day  . Alcohol Use: 0.0 oz/week    0 Not specified per week     Comment: occasionally   OB History    No data available     Review of Systems  Constitutional: Negative for fever and chills.  Respiratory: Negative for cough, chest tightness and shortness of breath.   Cardiovascular: Negative for chest pain, palpitations and leg swelling.  Gastrointestinal: Positive for abdominal  pain. Negative for nausea, vomiting, diarrhea and constipation.  Genitourinary: Negative for dysuria, flank pain, vaginal bleeding, vaginal discharge, vaginal pain and pelvic pain.  Musculoskeletal: Negative for myalgias, arthralgias, neck pain and neck stiffness.  Skin: Negative for rash.  Neurological: Negative for dizziness, weakness and headaches.  All other systems reviewed and are negative.     Allergies  Review of patient's allergies indicates no known allergies.  Home Medications   Prior to Admission medications   Medication Sig Start Date End Date Taking? Authorizing Provider  acetaminophen (TYLENOL 8 HOUR) 650 MG CR tablet Take 1 tablet (650 mg total) by mouth every 8 (eight) hours as needed for pain. 06/30/13   Richarda OverlieNayana Abrol, MD  ALPRAZolam Prudy Feeler(XANAX) 1 MG tablet Take 1 mg by mouth 4 (four) times daily as needed for anxiety.     Historical Provider, MD  carbamazepine (TEGRETOL) 200 MG tablet Take 200 mg by mouth daily.    Historical Provider, MD  cyclobenzaprine (FLEXERIL) 10 MG tablet Take 1 tablet (10 mg total) by mouth 2 (two) times daily as needed for muscle spasms (caution will cause drowsiness). For headache per patient 10/09/12   Cleora Fleetlanford L Johnson, MD  hydrochlorothiazide (HYDRODIURIL) 25 MG tablet Take 1 tablet (25 mg total) by mouth daily. 03/29/14   Josalyn C Funches, MD   BP 147/93 mmHg  Pulse 91  Temp(Src) 98.3 F (36.8 C)  Resp 16  SpO2 99%  Physical Exam  Constitutional: She is oriented to person, place, and time. She appears well-developed and well-nourished. No distress.  HENT:  Head: Normocephalic.  Eyes: Conjunctivae are normal.  Neck: Neck supple.  Cardiovascular: Normal rate, regular rhythm and normal heart sounds.   Pulmonary/Chest: Effort normal and breath sounds normal. No respiratory distress. She has no wheezes. She has no rales.  Abdominal: Soft. Bowel sounds are normal. She exhibits no distension. There is tenderness. There is no rebound.  RUQ,  Epigastric, LUQ tenderness  Musculoskeletal: She exhibits no edema.  Neurological: She is alert and oriented to person, place, and time.  Skin: Skin is warm and dry.  Psychiatric: She has a normal mood and affect. Her behavior is normal.  Nursing note and vitals reviewed.   ED Course  Procedures (including critical care time) Labs Review Labs Reviewed  CBC WITH DIFFERENTIAL - Abnormal; Notable for the following:    MCV 103.2 (*)    MCH 34.8 (*)    All other components within normal limits  COMPREHENSIVE METABOLIC PANEL - Abnormal; Notable for the following:    Sodium 134 (*)    Glucose, Bld 103 (*)    BUN <5 (*)    Albumin 3.0 (*)    AST 114 (*)    ALT 47 (*)    Alkaline Phosphatase 128 (*)    All other components within normal limits  URINALYSIS, ROUTINE W REFLEX MICROSCOPIC - Abnormal; Notable for the following:    Specific Gravity, Urine 1.002 (*)    All other components within normal limits  LIPASE, BLOOD  PREGNANCY, URINE    Imaging Review [place] Abdomen Complete  04/12/2014   CLINICAL DATA:  Abdominal pain; elevated liver enzymes  EXAM: ULTRASOUND ABDOMEN COMPLETE  COMPARISON:  CT abdomen and pelvis May 08, 2011  FINDINGS: Gallbladder: No gallstones or wall thickening visualized. There is no pericholecystic fluid. No sonographic Murphy sign noted.  Common bile duct: Diameter: 3 mm. There is no intrahepatic, common hepatic, or common bile duct dilatation.  Liver: No focal lesion identified. Liver is enlarged measuring 21.6 cm in length. Liver echogenicity is diffusely increased.  IVC: No abnormality visualized.  Pancreas: Visualized portion unremarkable. Much of pancreas is obscured by gas.  Spleen: Size and appearance within normal limits.  Right Kidney: Length: 11.2 cm. Echogenicity within normal limits. No mass or hydronephrosis visualized.  Left Kidney: Length: 11.3 cm. Echogenicity within normal limits. No mass or hydronephrosis visualized.  Abdominal aorta: No aneurysm  visualized.  Other findings: No demonstrable ascites.  IMPRESSION: Liver is enlarged with diffuse increased echogenicity, most likely due to hepatic steatosis. While no focal liver lesions are identified, it must be cautioned that the sensitivity of ultrasound for focal liver lesions is diminished in this circumstance.  Much of the pancreas is obscured by gas. Visualized portions of pancreas appear normal.  Study otherwise unremarkable.   Electronically Signed   By: Bretta BangWilliam  Woodruff M.D.   On: 04/12/2014 19:03   Dg Abd 2 Views  04/12/2014   CLINICAL DATA:  Severe upper abdominal pain and bloating for 3 weeks. Recent IUD insertion.  EXAM: ABDOMEN - 2 VIEW  COMPARISON:  CT abdomen and pelvis 05/08/2011  FINDINGS: There is no evidence of intraperitoneal free air. Stool and a small amount of gas are present throughout the colon. There is a paucity of small bowel gas. No dilated bowel loops suggestive of obstruction are identified. No air-fluid levels are seen. An intrauterine contraceptive device overlies pelvis. No abnormal soft  tissue calcification is seen. Visualized lung bases are grossly clear. No acute osseous abnormality is identified.  IMPRESSION: No evidence of bowel obstruction.   Electronically Signed   By: Sebastian Ache   On: 04/12/2014 14:39     EKG Interpretation None      MDM   Final diagnoses:  Hepatic steatosis  Elevated liver enzymes  Epigastric abdominal pain    patient with upper abdominal pain for 3 weeks, pain is intermittent, worsening. She's concerned that her IUD may be in the wrong position or that they perforated her uterus. Patient denies any pelvic pain, there is no tenderness over her lower abdomen. She did follow up with her OB/GYN and was told that her IUD and is a right position at that time. Denies any vaginal bleeding or discharge. Patient lab work was obtained in triage and his back, she has elevated AST of 114, ALT 47, alkaline phosphatase 128. She denies any  prior liver problems. Will get ultrasound of her abdomen for further evaluation  US showing liver enlargement with diffuse echogenicity, most likely due to hepatic steatosis. No other findings. Patient is feeling better. She admits to not drinking heavily, no history of hepatitis or liver problems. She denies IV drug use. No recent travel. I have offered her a CT of her abdomen for further evaluation of her liver enlargement, however she did not want to wait any longer. She states she will follow-up with her doctor this week and see if they can schedule outpatient imaging. I believe this is okay, and I advised patient to follow-up closely. At this time will treat with Pepcid, Carafate, Bentyl. Follow up with primary care doctor soon as possible. Return if symptoms worsening  Filed Vitals:   04/12/14 1302 04/12/14 1500 04/12/14 1941  BP: 131/90 147/93 148/87  Pulse: 95 91 82  Temp: 98.3 F (36.8 C)  98 F (36.7 C)  TempSrc:   Oral  Resp: 18 16 16   SpO2: 95% 99% 98%      Lottie Mussel, PA-C 04/12/14 2159  Glynn Octave, MD 04/12/14 2307

## 2014-04-12 NOTE — ED Notes (Signed)
States constant pain across upper abdomen for the past 3 weeks. States no variation in pain with meals. States no nausea or vomiting. Denies fever

## 2014-05-05 ENCOUNTER — Telehealth: Payer: Self-pay | Admitting: *Deleted

## 2014-05-05 NOTE — Telephone Encounter (Signed)
Left voice message with normal results If any question return call 

## 2014-05-05 NOTE — Telephone Encounter (Signed)
-----   Message from Lora PaulaJosalyn C Funches, MD sent at 03/30/2014  2:31 PM EST ----- Normal hormone levels. No post menopausal

## 2014-06-01 ENCOUNTER — Ambulatory Visit: Payer: Self-pay | Admitting: Internal Medicine

## 2014-06-22 ENCOUNTER — Ambulatory Visit: Payer: Self-pay | Attending: Family Medicine | Admitting: Family Medicine

## 2014-06-22 ENCOUNTER — Ambulatory Visit: Payer: Self-pay | Admitting: Family Medicine

## 2014-06-22 ENCOUNTER — Encounter: Payer: Self-pay | Admitting: Family Medicine

## 2014-06-22 VITALS — BP 145/77 | HR 94 | Temp 98.0°F | Resp 18 | Ht 69.0 in | Wt 256.0 lb

## 2014-06-22 DIAGNOSIS — R600 Localized edema: Secondary | ICD-10-CM | POA: Insufficient documentation

## 2014-06-22 DIAGNOSIS — I10 Essential (primary) hypertension: Secondary | ICD-10-CM | POA: Insufficient documentation

## 2014-06-22 DIAGNOSIS — I878 Other specified disorders of veins: Secondary | ICD-10-CM | POA: Insufficient documentation

## 2014-06-22 DIAGNOSIS — F411 Generalized anxiety disorder: Secondary | ICD-10-CM

## 2014-06-22 DIAGNOSIS — F17210 Nicotine dependence, cigarettes, uncomplicated: Secondary | ICD-10-CM | POA: Insufficient documentation

## 2014-06-22 DIAGNOSIS — R0600 Dyspnea, unspecified: Secondary | ICD-10-CM | POA: Insufficient documentation

## 2014-06-22 DIAGNOSIS — E669 Obesity, unspecified: Secondary | ICD-10-CM | POA: Insufficient documentation

## 2014-06-22 DIAGNOSIS — Z6837 Body mass index (BMI) 37.0-37.9, adult: Secondary | ICD-10-CM | POA: Insufficient documentation

## 2014-06-22 LAB — COMPLETE METABOLIC PANEL WITH GFR
ALT: 36 U/L — AB (ref 0–35)
AST: 91 U/L — ABNORMAL HIGH (ref 0–37)
Albumin: 3.3 g/dL — ABNORMAL LOW (ref 3.5–5.2)
Alkaline Phosphatase: 129 U/L — ABNORMAL HIGH (ref 39–117)
BUN: 5 mg/dL — AB (ref 6–23)
CO2: 25 meq/L (ref 19–32)
Calcium: 8.6 mg/dL (ref 8.4–10.5)
Chloride: 102 mEq/L (ref 96–112)
Creat: 0.69 mg/dL (ref 0.50–1.10)
GFR, Est African American: >89 mL/min
GLUCOSE: 127 mg/dL — AB (ref 70–99)
POTASSIUM: 4.6 meq/L (ref 3.5–5.3)
SODIUM: 137 meq/L (ref 135–145)
Total Bilirubin: 1.6 mg/dL — ABNORMAL HIGH (ref 0.2–1.2)
Total Protein: 7.2 g/dL (ref 6.0–8.3)

## 2014-06-22 LAB — CBC
HEMATOCRIT: 40.8 % (ref 36.0–46.0)
Hemoglobin: 14 g/dL (ref 12.0–15.0)
MCH: 36 pg — AB (ref 26.0–34.0)
MCHC: 34.3 g/dL (ref 30.0–36.0)
MCV: 104.9 fL — ABNORMAL HIGH (ref 78.0–100.0)
MPV: 9.6 fL (ref 8.6–12.4)
PLATELETS: 161 10*3/uL (ref 150–400)
RBC: 3.89 MIL/uL (ref 3.87–5.11)
RDW: 13.6 % (ref 11.5–15.5)
WBC: 7.1 10*3/uL (ref 4.0–10.5)

## 2014-06-22 MED ORDER — ALPRAZOLAM 1 MG PO TABS: 1.0000 mg | ORAL_TABLET | Freq: Three times a day (TID) | ORAL | Status: DC | PRN

## 2014-06-22 MED ORDER — FUROSEMIDE 40 MG PO TABS: 40.0000 mg | ORAL_TABLET | Freq: Every day | ORAL | Status: DC

## 2014-06-22 NOTE — Progress Notes (Signed)
   Subjective:    Patient ID: Julie Rowland, female    DOB: 04/22/1970, 44 y.o.   MRN: [id number] CC: leg swelling  HPI 44 yo F with HTN, smoker, obesity  1. Leg swelling: x 2 weeks. Mostly at ankles. Associated with pain. Pain is worse after working all day. Patient admits to SOB x 3 months. No CP. No cough. She is compliant with HCTZ 25 mg once daily.   Soc Hx: smokes 2-3 cigs daily  Review of Systems As per HPI     Objective:   Physical Exam BP 145/77 mmHg  Pulse 94  Temp(Src) 98 F (36.7 C) (Oral)  Resp 18  Ht 5\' 9"  (1.753 m)  Wt 256 lb (116.121 kg)  BMI 37.79 kg/m2  SpO2 97%  BP Readings from Last 3 Encounters:  06/22/14 145/77  04/12/14 148/87  03/29/14 157/89   Wt Readings from Last 3 Encounters:  06/22/14 256 lb (116.121 kg)  03/29/14 241 lb (109.317 kg)  03/22/14 243 lb (110.224 kg)  General appearance: alert, cooperative, no distress and moderately obese  Neck: no adenopathy, no JVD, supple, symmetrical, trachea midline and thyroid not enlarged, symmetric, no tenderness/mass/nodules Lungs: clear to auscultation bilaterally Heart: regular rate and rhythm, S1, S2 normal, no murmur, click, rub or gallop Extremities: extremities normal, atraumatic, no cyanosis or edema       Assessment & Plan:

## 2014-06-22 NOTE — Assessment & Plan Note (Addendum)
A: HTN, smoker, obesity with weight gain, complaining of dyspnea with normal lung exam. Noted LE edema 1 + b/l. Ddx CHF, obesity related edema. Obesity related edema most likely. Patient w/o hx of anemia, or low albumin or pelvic mass.  P: CXR ECHO Lasix 40 mg once daily for next week CMP to monitor Cr and potassium  CBC Elevated  Compression stockings

## 2014-06-22 NOTE — Patient Instructions (Addendum)
Ms. Ronie SpiesMcNaughton,  Thank you for coming in today.  1. Leg swelling with weight gain and SOB Likely cause- weight gain Must rule out- congestive heart failure  Work- up: CBC, CMP, TSH, CXR, Echocardiogram  Treatment: Lasix 40 mg in the morning Elevate legs above the level of the heart when resting Compression stockings Low salt diet.    2. Anxiety: [facility].    F/u with Dr.  Hyman HopesJegede in 2-3 weeks for anxiety, weight gain and edema.   Dr. Armen PickupFunches

## 2014-06-22 NOTE — Progress Notes (Signed)
Leg and feet swelling  Worst in rt leg x1 week No Hx injury

## 2014-06-23 LAB — TSH: TSH: 2.747 u[IU]/mL (ref 0.350–4.500)

## 2014-06-24 ENCOUNTER — Ambulatory Visit (HOSPITAL_COMMUNITY)
Admission: RE | Admit: 2014-06-24 | Discharge: 2014-06-24 | Disposition: A | Discharge status: Home / Self Care | Payer: Self-pay | Source: Ambulatory Visit | Attending: Family Medicine | Admitting: Family Medicine

## 2014-06-24 DIAGNOSIS — R0602 Shortness of breath: Secondary | ICD-10-CM | POA: Insufficient documentation

## 2014-06-24 DIAGNOSIS — R600 Localized edema: Secondary | ICD-10-CM | POA: Insufficient documentation

## 2014-06-24 DIAGNOSIS — E785 Hyperlipidemia, unspecified: Secondary | ICD-10-CM | POA: Insufficient documentation

## 2014-06-24 DIAGNOSIS — I509 Heart failure, unspecified: Secondary | ICD-10-CM

## 2014-06-24 DIAGNOSIS — I10 Essential (primary) hypertension: Secondary | ICD-10-CM | POA: Insufficient documentation

## 2014-06-24 NOTE — Progress Notes (Signed)
  Echocardiogram 2D Echocardiogram has been performed.  Leta JunglingCooper, Kailan Carmen M 06/24/2014, 10:02 AM

## 2014-06-25 ENCOUNTER — Telehealth: Payer: Self-pay | Admitting: Internal Medicine

## 2014-06-25 ENCOUNTER — Telehealth: Payer: Self-pay | Admitting: Family Medicine

## 2014-06-25 ENCOUNTER — Telehealth: Payer: Self-pay | Admitting: *Deleted

## 2014-06-25 NOTE — Telephone Encounter (Signed)
-----   Message from Doris Cheadleeepak Advani, MD sent at 06/24/2014  4:35 PM EST ----- Call and let the patient know that to her x-ray chest reported right middle lobe scarring or atelectasis, with no acute findings suggestive of bony lesion or pneumonia, heart is normal size. She has been prescribed Lasix recently on the her last visit continue with the current medications and follow with PCP.

## 2014-06-25 NOTE — Telephone Encounter (Signed)
Pt called returning nurse's phone call about her results. Please f/u with pt

## 2014-06-25 NOTE — Telephone Encounter (Signed)
Patient is returning call from nurse about results, please f/u with pt.

## 2014-06-25 NOTE — Telephone Encounter (Signed)
-----   Message from Doris Cheadleeepak Advani, MD sent at 06/24/2014  4:31 PM EST ----- Call and let the patient know that her echocardiogram reported to be normal  Impressions:  - Normal LV size and systolic function, EF 60-65%. Normal RV size and systolic function. No significant valvular abnormalities.

## 2014-06-25 NOTE — Telephone Encounter (Signed)
Pt returning calling regarding results, please f/u with pt.  °

## 2014-06-25 NOTE — Telephone Encounter (Signed)
Left voice message to return call 

## 2014-06-25 NOTE — Telephone Encounter (Signed)
-----  Message from Boykin Nearing, MD sent at 06/23/2014  1:38 PM EST ----- Evaluating edema: normal TSH, no anemia. CMP with elevated AST>ALT, elevated alk phos, slightly low albumin. Is patient drinking alcohol, if so she is urged to cut back to 1 drink daily as ETOH is affecting liver.  If not drinking- further evaluation is warranted-recommend fasting lipids, repeat fasting CMP and possible ABUS (evaluate RUQ).   Junious Dresser- please call patient with results Dr. Wolfgang Phoenix. I have instructed patient to f/u with you since I just saw her for IUD placement and string check.

## 2014-06-28 ENCOUNTER — Telehealth: Payer: Self-pay | Admitting: Internal Medicine

## 2014-06-28 ENCOUNTER — Ambulatory Visit: Payer: Self-pay | Admitting: Family Medicine

## 2014-06-28 ENCOUNTER — Telehealth: Payer: Self-pay | Admitting: Emergency Medicine

## 2014-06-28 NOTE — Telephone Encounter (Signed)
Left voice message to return call 

## 2014-06-28 NOTE — Telephone Encounter (Signed)
Pt is calling to inquire about her results. Please follow up with pt.  °

## 2014-06-28 NOTE — Telephone Encounter (Signed)
Left message to return call for lab results.

## 2014-06-29 ENCOUNTER — Encounter: Payer: Self-pay | Admitting: *Deleted

## 2014-06-29 ENCOUNTER — Telehealth: Payer: Self-pay | Admitting: Internal Medicine

## 2014-06-29 DIAGNOSIS — I10 Essential (primary) hypertension: Secondary | ICD-10-CM

## 2014-06-29 MED ORDER — HYDROCHLOROTHIAZIDE 25 MG PO TABS: 25.0000 mg | ORAL_TABLET | Freq: Every day | ORAL | Status: DC

## 2014-06-29 NOTE — Telephone Encounter (Signed)
Patient is returning call from nurse, please f/u with pt. °

## 2014-06-29 NOTE — Progress Notes (Signed)
Pt is aware of her lab results. Pt needed a refill for her BP medication and I sent it to her pharmacy.

## 2014-06-30 ENCOUNTER — Emergency Department (HOSPITAL_COMMUNITY): Payer: Self-pay

## 2014-06-30 ENCOUNTER — Emergency Department (HOSPITAL_COMMUNITY)
Admission: EM | Admit: 2014-06-30 | Discharge: 2014-07-01 | Disposition: A | Discharge status: Home / Self Care | Payer: Self-pay | Attending: Emergency Medicine | Admitting: Emergency Medicine

## 2014-06-30 DIAGNOSIS — E669 Obesity, unspecified: Secondary | ICD-10-CM | POA: Insufficient documentation

## 2014-06-30 DIAGNOSIS — Z720 Tobacco use: Secondary | ICD-10-CM | POA: Insufficient documentation

## 2014-06-30 DIAGNOSIS — R140 Abdominal distension (gaseous): Secondary | ICD-10-CM | POA: Insufficient documentation

## 2014-06-30 DIAGNOSIS — R233 Spontaneous ecchymoses: Secondary | ICD-10-CM | POA: Insufficient documentation

## 2014-06-30 DIAGNOSIS — Z8669 Personal history of other diseases of the nervous system and sense organs: Secondary | ICD-10-CM | POA: Insufficient documentation

## 2014-06-30 DIAGNOSIS — G43909 Migraine, unspecified, not intractable, without status migrainosus: Secondary | ICD-10-CM | POA: Insufficient documentation

## 2014-06-30 DIAGNOSIS — Z3202 Encounter for pregnancy test, result negative: Secondary | ICD-10-CM | POA: Insufficient documentation

## 2014-06-30 DIAGNOSIS — F419 Anxiety disorder, unspecified: Secondary | ICD-10-CM | POA: Insufficient documentation

## 2014-06-30 DIAGNOSIS — R198 Other specified symptoms and signs involving the digestive system and abdomen: Secondary | ICD-10-CM

## 2014-06-30 DIAGNOSIS — Z79899 Other long term (current) drug therapy: Secondary | ICD-10-CM | POA: Insufficient documentation

## 2014-06-30 DIAGNOSIS — I10 Essential (primary) hypertension: Secondary | ICD-10-CM | POA: Insufficient documentation

## 2014-06-30 DIAGNOSIS — R748 Abnormal levels of other serum enzymes: Secondary | ICD-10-CM

## 2014-06-30 LAB — CBC WITH DIFFERENTIAL/PLATELET
BASOS ABS: 0 10*3/uL (ref 0.0–0.1)
BASOS PCT: 1 % (ref 0–1)
EOS PCT: 4 % (ref 0–5)
Eosinophils Absolute: 0.2 10*3/uL (ref 0.0–0.7)
HEMATOCRIT: 40.1 % (ref 36.0–46.0)
Hemoglobin: 13.9 g/dL (ref 12.0–15.0)
Lymphocytes Relative: 29 % (ref 12–46)
Lymphs Abs: 2 10*3/uL (ref 0.7–4.0)
MCH: 36.8 pg — ABNORMAL HIGH (ref 26.0–34.0)
MCHC: 34.7 g/dL (ref 30.0–36.0)
MCV: 106.1 fL — ABNORMAL HIGH (ref 78.0–100.0)
Monocytes Absolute: 0.8 10*3/uL (ref 0.1–1.0)
Monocytes Relative: 12 % (ref 3–12)
NEUTROS ABS: 3.8 10*3/uL (ref 1.7–7.7)
Neutrophils Relative %: 54 % (ref 43–77)
PLATELETS: 172 10*3/uL (ref 150–400)
RBC: 3.78 MIL/uL — ABNORMAL LOW (ref 3.87–5.11)
RDW: 13.4 % (ref 11.5–15.5)
WBC: 7 10*3/uL (ref 4.0–10.5)

## 2014-06-30 LAB — URINALYSIS, ROUTINE W REFLEX MICROSCOPIC
Bilirubin Urine: NEGATIVE
Glucose, UA: NEGATIVE mg/dL
Ketones, ur: NEGATIVE mg/dL
Leukocytes, UA: NEGATIVE
Nitrite: NEGATIVE
Protein, ur: NEGATIVE mg/dL
SPECIFIC GRAVITY, URINE: 1.008 (ref 1.005–1.030)
UROBILINOGEN UA: 1 mg/dL (ref 0.0–1.0)
pH: 6 (ref 5.0–8.0)

## 2014-06-30 LAB — POC URINE PREG, ED: PREG TEST UR: NEGATIVE

## 2014-06-30 LAB — URINE MICROSCOPIC-ADD ON

## 2014-06-30 MED ORDER — MORPHINE SULFATE 4 MG/ML IJ SOLN
6.0000 mg | Freq: Once | INTRAMUSCULAR | Status: AC
  Administered 2014-06-30: 6 mg via INTRAVENOUS
  Filled 2014-06-30: qty 2

## 2014-06-30 MED ORDER — FENTANYL CITRATE 0.05 MG/ML IJ SOLN
50.0000 ug | Freq: Once | INTRAMUSCULAR | Status: AC
  Administered 2014-06-30: 50 ug via INTRAVENOUS
  Filled 2014-06-30: qty 2

## 2014-06-30 MED ORDER — IOHEXOL 300 MG/ML  SOLN
50.0000 mL | Freq: Once | INTRAMUSCULAR | Status: AC | PRN
  Administered 2014-06-30: 50 mL via ORAL

## 2014-06-30 MED ORDER — IOHEXOL 300 MG/ML  SOLN
100.0000 mL | Freq: Once | INTRAMUSCULAR | Status: AC | PRN
  Administered 2014-06-30: 100 mL via INTRAVENOUS

## 2014-06-30 NOTE — ED Provider Notes (Signed)
Patient reports periumbilical pain and bleeding from her umbilicus today. On exam patient is alert no distress nontoxic abdomen morbidly obese. Dried blood at umbilicus. With mild corresponding tenderness. No masses  Doug SouSam Julie Renwick, MD 06/30/14 2310

## 2014-06-30 NOTE — ED Provider Notes (Signed)
CSN: [id number]     Arrival date & time 06/30/14  1750 History   First MD Initiated Contact with Patient 06/30/14 2138     Chief Complaint  Patient presents with  . naval bleeding    . Umbillicus Pain    Julie Rowland is a 44 y.o. female with a history of hypertension who presents to the emergency department complaining of bleeding from her navel earlier today. She also complains of abdominal pain that she rates it a 10 out of 10 around her umbilicus and worse with movement or touching. She reports having bleeding out of her navel that stopped soon after applying pressure. She reports her abdomen has become more distended over the past 2 months and has gained about 25 pounds in the past 2 months. She reports being seen by her primary care provider who did lab work and tests to report they were normal. [facility] [place] she is a former alcoholic but has not drank alcohol and more than 2 years. She denies any nausea, vomiting, diarrhea or constipation. She reports no bowel movement this morning. She reports eating and drinking normally. The patient reports she has been tanning in the tanning bed and that is why her abdomen and back look red. The patient denies fevers, recent illness, chest pain, cough,, urinary frequency, urinary urgency, hematuria, nausea, vomiting, diarrhea or constipation. The patient has taken nothing for treatment today.  (Consider location/radiation/quality/duration/timing/severity/associated sxs/prior Treatment) HPI  Past Medical History  Diagnosis Date  . Migraine headache   . Hypertension   . Anxiety   . Seizures 2014 and 2009   No past surgical history on file. Family History  Problem Relation Age of Onset  . Hypertension Mother   . Cancer Mother   . Diabetes Mother    History  Substance Use Topics  . Smoking status: Current Every Day Smoker -- 20 years    Types: Cigarettes  . Smokeless tobacco: Not on file     Comment: cut  back to 4-5 cigarettes per day  . Alcohol Use: 0.0 oz/week    0 Standard drinks or equivalent per week     Comment: occasionally   OB History    No data available     Review of Systems  Constitutional: Negative for fever, chills and appetite change.  HENT: Negative for congestion and sore throat.   Eyes: Negative for visual disturbance.  Respiratory: Negative for cough, shortness of breath and wheezing.   Cardiovascular: Negative for chest pain and palpitations.  Gastrointestinal: Positive for abdominal pain and abdominal distention. Negative for nausea, vomiting, diarrhea, constipation and blood in stool.  Genitourinary: Negative for dysuria, urgency, frequency, hematuria, vaginal bleeding, vaginal discharge and difficulty urinating.  Musculoskeletal: Negative for back pain and neck pain.  Skin: Negative for rash.  Neurological: Negative for syncope, weakness, light-headedness and headaches.      Allergies  Review of patient's allergies indicates no known allergies.  Home Medications   Prior to Admission medications   Medication Sig Start Date End Date Taking? Authorizing Provider  cyclobenzaprine (FLEXERIL) 10 MG tablet Take 1 tablet (10 mg total) by mouth 2 (two) times daily as needed for muscle spasms (caution will cause drowsiness). For headache per patient 10/09/12  Yes Clanford Marisa Hua, MD  hydrochlorothiazide (HYDRODIURIL) 25 MG tablet Take 1 tablet (25 mg total) by mouth daily. 06/29/14  Yes Tresa Garter, MD  zinc gluconate 50 MG tablet Take 50 mg by mouth daily.  Yes Historical Provider, MD  zolpidem (AMBIEN) 10 MG tablet Take 10 mg by mouth at bedtime as needed for sleep.   Yes Historical Provider, MD  dicyclomine (BENTYL) 20 MG tablet Take 1 tablet (20 mg total) by mouth 2 (two) times daily. Patient not taking: Reported on 06/30/2014 04/12/14   Lahoma Rocker Kirichenko, PA-C  famotidine (PEPCID) 20 MG tablet Take 1 tablet (20 mg total) by mouth 2 (two) times  daily. Patient not taking: Reported on 06/22/2014 04/12/14   Tatyana Kirichenko, PA-C  furosemide (LASIX) 40 MG tablet Take 1 tablet (40 mg total) by mouth daily. Patient not taking: Reported on 06/30/2014 06/22/14   Boykin Nearing, MD  potassium chloride (K-DUR) 10 MEQ tablet Take 1 tablet (10 mEq total) by mouth daily. 07/01/14   Waynetta Pean, PA-C  sucralfate (CARAFATE) 1 G tablet Take 1 tablet (1 g total) by mouth 4 (four) times daily -  with meals and at bedtime. Patient not taking: Reported on 06/30/2014 04/12/14   Tatyana Kirichenko, PA-C  traMADol (ULTRAM) 50 MG tablet Take 1 tablet (50 mg total) by mouth every 6 (six) hours as needed for moderate pain or severe pain. 07/01/14   Waynetta Pean, PA-C   BP 135/78 mmHg  Pulse 91  Temp(Src) 97.6 F (36.4 C) (Oral)  Resp 14  SpO2 95% Physical Exam  Constitutional: She appears well-developed and well-nourished. No distress.  Obese female. Non-toxic appearing. Her abdomen and back are sunburned from tanning bed.   HENT:  Head: Normocephalic and atraumatic.  Mouth/Throat: Oropharynx is clear and moist. No oropharyngeal exudate.  Eyes: Conjunctivae are normal. Pupils are equal, round, and reactive to light. Right eye exhibits no discharge. Left eye exhibits no discharge.  Neck: Neck supple. No JVD present.  Cardiovascular: Normal rate, regular rhythm, normal heart sounds and intact distal pulses.  Exam reveals no gallop and no friction rub.   No murmur heard. Pulmonary/Chest: Effort normal and breath sounds normal. No respiratory distress. She has no wheezes. She has no rales.  Abdominal: Soft. Bowel sounds are normal. There is tenderness.  Abdomen is soft and bowel sounds are present. It does appear slightly distended. No fluid wave. No umbilical bleeding noted. There is a black center in her umbilicus but no hernia or bleeding noted. Her abdomen is red from her tanning.   Musculoskeletal: She exhibits no edema.  Lymphadenopathy:    She has  no cervical adenopathy.  Neurological: She is alert. Coordination normal.  Skin: Skin is warm and dry. No rash noted. She is not diaphoretic. No erythema. No pallor.  Psychiatric: She has a normal mood and affect. Her behavior is normal.  Nursing note and vitals reviewed.   ED Course  Procedures (including critical care time) Labs Review Labs Reviewed  CBC WITH DIFFERENTIAL/PLATELET - Abnormal; Notable for the following:    RBC 3.78 (*)    MCV 106.1 (*)    MCH 36.8 (*)    All other components within normal limits  URINALYSIS, ROUTINE W REFLEX MICROSCOPIC - Abnormal; Notable for the following:    Color, Urine AMBER (*)    Hgb urine dipstick MODERATE (*)    All other components within normal limits  COMPREHENSIVE METABOLIC PANEL - Abnormal; Notable for the following:    Sodium 133 (*)    Potassium 3.2 (*)    BUN 5 (*)    Calcium 8.3 (*)    Albumin 3.1 (*)    AST 95 (*)    ALT 38 (*)  Alkaline Phosphatase 119 (*)    Total Bilirubin 2.6 (*)    All other components within normal limits  URINE MICROSCOPIC-ADD ON  HEPATITIS PANEL, ACUTE  POC URINE PREG, ED    Imaging Review Ct Abdomen Pelvis W Contrast  07/01/2014   CLINICAL DATA:  Chronic abdominal distention for 2 months. The from the umbilicus. Initial encounter.  EXAM: CT ABDOMEN AND PELVIS WITH CONTRAST  TECHNIQUE: Multidetector CT imaging of the abdomen and pelvis was performed using the standard protocol following bolus administration of intravenous contrast.  CONTRAST:  163m OMNIPAQUE IOHEXOL 300 MG/ML SOLN, 576mOMNIPAQUE IOHEXOL 300 MG/ML SOLN  COMPARISON:  CT of the abdomen and pelvis from 05/08/2011, and abdominal ultrasound performed 04/12/2014  FINDINGS: The visualized lung bases are clear.  Areas of heterogeneity are seen within the liver, somewhat nodular in appearance. Though this could simply reflect fatty infiltration, underlying masses cannot be excluded. Dynamic liver protocol MRI or CT would be helpful for  further evaluation. The presence of predominantly new varices, as described below, raises concern for mild hepatic cirrhosis.  The spleen is unremarkable in appearance. The gallbladder is grossly unremarkable. The pancreas and adrenal glands are within normal limits.  There is recanalization of the umbilical vein, more prominent than on the prior study, with significantly increased prominence of dilated vessels at the umbilicus, likely corresponding to the site of the patient's bleeding. Scattered gastric and splenic varices are seen.  The kidneys are unremarkable in appearance. There is no evidence of hydronephrosis. No renal or ureteral stones are seen. No perinephric stranding is appreciated.  No free fluid is identified. The small bowel is unremarkable in appearance. The stomach is within normal limits. No acute vascular abnormalities are seen. Mild calcification is seen along the abdominal aorta and its branches.  There is somewhat unusual prominence of the venous vasculature within the retroperitoneum.  The appendix is normal in caliber, without evidence for appendicitis.  Trace fluid is seen tracking along the paracolic gutters bilaterally, new from the prior study. There is vague soft tissue inflammation and vascular prominence about the ascending and descending colon, which may reflect a mild infectious or inflammatory process. Underlying chronic fatty infiltration of the wall is again noted, reflecting sequelae of chronic inflammation.  The bladder is mildly distended and grossly unremarkable. The uterus is unremarkable in appearance, with an intrauterine device noted in expected position at the fundus of the uterus. The ovaries are relatively symmetric. No suspicious adnexal masses are seen. No inguinal lymphadenopathy is seen.  No acute osseous abnormalities are identified.  IMPRESSION: 1. Recanalization of the umbilical vein, more prominent than on the prior study, though significantly increased  prominence of dilated veins of the umbilicus, likely corresponding to the site of the patient's bleeding. Scattered gastric and splenic varices noted. Somewhat unusual prominence of the venous vasculature within the retroperitoneum. 2. Findings raise suspicion for underlying mild hepatic cirrhosis. Would correlate with hepatic labs and hepatitis panels. 3. Focal areas of heterogeneity seen within the liver, somewhat nodular in appearance. Though this could simply reflect fatty infiltration, underlying masses cannot be excluded. Dynamic liver protocol MRI or CT would be helpful for further evaluation, when and as deemed clinically appropriate. 4. Trace fluid tracking along the paracolic gutters bilaterally, new from the prior study. Vague soft tissue inflammation and vascular prominence about the ascending and descending colon, which may reflect a mild infectious or inflammatory process. Underlying chronic fatty infiltration of the colonic wall again noted, reflecting sequelae of chronic inflammation.  5. Mild calcification along the abdominal aorta and its branches.   Electronically Signed   By: Garald Balding M.D.   On: 07/01/2014 00:52     EKG Interpretation None      Filed Vitals:   06/30/14 1752 06/30/14 2122 07/01/14 0132  BP: 170/83 153/83 135/78  Pulse: 95 93 91  Temp: 97.6 F (36.4 C)    TempSrc: Oral    Resp: 18 16 14   SpO2: 100% 95% 95%     MDM   Meds given in ED:  Medications  ondansetron (ZOFRAN) injection 4 mg (not administered)  fentaNYL (SUBLIMAZE) injection 50 mcg (50 mcg Intravenous Given 06/30/14 2201)  morphine 4 MG/ML injection 6 mg (6 mg Intravenous Given 06/30/14 2335)  iohexol (OMNIPAQUE) 300 MG/ML solution 50 mL (50 mLs Oral Contrast Given 06/30/14 2350)  iohexol (OMNIPAQUE) 300 MG/ML solution 100 mL (100 mLs Intravenous Contrast Given 06/30/14 2350)  ondansetron (ZOFRAN-ODT) disintegrating tablet 4 mg (0 mg Oral Duplicate 3/34/35 6861)    New Prescriptions    POTASSIUM CHLORIDE (K-DUR) 10 MEQ TABLET    Take 1 tablet (10 mEq total) by mouth daily.   TRAMADOL (ULTRAM) 50 MG TABLET    Take 1 tablet (50 mg total) by mouth every 6 (six) hours as needed for moderate pain or severe pain.    Final diagnoses:  Elevated liver enzymes  Umbilical bleeding   This  is a 44 y.o. female with a history of hypertension who presents to the emergency department complaining of bleeding from her navel earlier today. She also complains of abdominal pain that she rates it a 10 out of 10 around her umbilicus and worse with movement or touching. She reports having bleeding out of her navel that stopped soon after applying pressure.  She has been worked up recently by her PCP who reports everything looked normal. She is a former alcoholic. The patient is afebrile and nontoxic appearing. The patient has dry blood at her umbilicus with tenderness to palpation around her umbilicus. There is no mass or signs of hernia. Will check labs and CT scan.  At 12:15 the patient reports 2/10 pain and is feeling better. I advised her that we are still awaiting on CT scan.  The patient's CMP indicates a potassium of 3.2. Her liver enzymes are mildly elevated with an AST of 95 and an ALT 38. Her alk phosphatase is mildly elevated at 119. She has a total bilirubin of 2.6. All of these results are in line with her recent testing. Except her total bilirubin is slightly elevated from her previous.  She has a negative urine pregnancy test. Urinalysis is negative for infection. CBC is unremarkable. She has has no more bleeding from her umbilicus.  CT scan shows recannulization of the umbilical vein with scattered gastric and splenic varices noted. The findings raise suspicion for underlying mild hepatic cirrhosis.  There are focal areas of heterogeneity city seen in the liver that could reflect fatty infiltration or underlying masses. Recommended dynamic liver protocol with MRI would be helpful for further  evaluation.  After discussing with Dr. Roxanne Mins, will obtain acute hepatitis panel in the ED for her PCP to follow up with. I advised the patient of her laboratory and CT scan findings. I encouraged her to follow-up this week with her primary care provider for further evaluation. Provided the patient with prescription for potassium for her mild hypokalemia as well as tramadol for her pain. I advised the patient to follow-up with their primary  care provider this week. I advised the patient to return to the emergency department with new or worsening symptoms or new concerns. The patient verbalized understanding and agreement with plan.    This patient was discussed with Dr. Roxanne Mins who agrees with assessment and plan.     Waynetta Pean, PA-C 07/01/14 6415  Orlie Dakin, MD 07/11/14 [phone]

## 2014-06-30 NOTE — ED Notes (Signed)
Pt making comments that she is "bleeding out". rn went out to reassess pt. rn looked at [employer]. No bleeding at this time.

## 2014-06-30 NOTE — ED Notes (Addendum)
Per ems pt is from work, pt reports abdominal distention x2 months, has been to pcp, was told she was just gaining weight. Pt had echo and chest xray 1 week ago, went to pcp yesterday for results, was told everything was normal. Pt today went to work, went to use the bathroom, pt was bleeding from her naval. Estimated 30 ml. Bleeding controlled at this time. Denies trauma or injury.   Reports IUD was switched out in Oct, started having abd distention in Dec 2015

## 2014-07-01 ENCOUNTER — Telehealth: Payer: Self-pay | Admitting: Emergency Medicine

## 2014-07-01 LAB — COMPREHENSIVE METABOLIC PANEL
ALBUMIN: 3.1 g/dL — AB (ref 3.5–5.2)
ALT: 38 U/L — ABNORMAL HIGH (ref 0–35)
ANION GAP: 9 (ref 5–15)
AST: 95 U/L — ABNORMAL HIGH (ref 0–37)
Alkaline Phosphatase: 119 U/L — ABNORMAL HIGH (ref 39–117)
BUN: 5 mg/dL — AB (ref 6–23)
CALCIUM: 8.3 mg/dL — AB (ref 8.4–10.5)
CHLORIDE: 103 mmol/L (ref 96–112)
CO2: 21 mmol/L (ref 19–32)
Creatinine, Ser: 0.64 mg/dL (ref 0.50–1.10)
GFR calc Af Amer: >90 mL/min (ref >90)
GFR calc non Af Amer: >90 mL/min (ref >90)
Glucose, Bld: 90 mg/dL (ref 70–99)
Potassium: 3.2 mmol/L — ABNORMAL LOW (ref 3.5–5.1)
Sodium: 133 mmol/L — ABNORMAL LOW (ref 135–145)
Total Bilirubin: 2.6 mg/dL — ABNORMAL HIGH (ref 0.3–1.2)
Total Protein: 7.3 g/dL (ref 6.0–8.3)

## 2014-07-01 LAB — HEPATITIS PANEL, ACUTE
HCV Ab: NEGATIVE
HEP A IGM: NONREACTIVE
Hep B C IgM: NONREACTIVE
Hepatitis B Surface Ag: NEGATIVE

## 2014-07-01 MED ORDER — POTASSIUM CHLORIDE ER 10 MEQ PO TBCR: 10.0000 meq | EXTENDED_RELEASE_TABLET | Freq: Every day | ORAL | Status: DC

## 2014-07-01 MED ORDER — TRAMADOL HCL 50 MG PO TABS: 50.0000 mg | ORAL_TABLET | Freq: Four times a day (QID) | ORAL | Status: DC | PRN

## 2014-07-01 MED ORDER — ONDANSETRON HCL 4 MG/2ML IJ SOLN
4.0000 mg | Freq: Once | INTRAMUSCULAR | Status: AC
  Administered 2014-07-01: 4 mg via INTRAVENOUS
  Filled 2014-07-01: qty 2

## 2014-07-01 MED ORDER — ONDANSETRON 4 MG PO TBDP
4.0000 mg | ORAL_TABLET | Freq: Once | ORAL | Status: AC
  Filled 2014-07-01: qty 1

## 2014-07-01 NOTE — Telephone Encounter (Signed)
Returned pt call. Lab/echo results given  Pt states she cant take Lasix due to increased urination while at work Wondering if something else  can be prescribed

## 2014-07-01 NOTE — Discharge Instructions (Signed)
You had findings on your ct concerning for cirrhosis. Would recommend outpatient MRI of your liver by your primary care and follow up on your hepatitis panel. See above for follow up.  Cirrhosis Cirrhosis is a condition of scarring of the liver which is caused when the liver has tried repairing itself following damage. This damage may come from a previous infection such as one of the forms of hepatitis (usually hepatitis C), or the damage may come from being injured by toxins. The main toxin that causes this damage is alcohol. The scarring of the liver from use of alcohol is irreversible. That means the liver cannot return to normal even though alcohol is not used any more. The main danger of hepatitis C infection is that it may cause long-lasting (chronic) liver disease, and this also may lead to cirrhosis. This complication is progressive and irreversible. CAUSES  Prior to available blood tests, hepatitis C could be contracted by blood transfusions. Since testing of blood has improved, this is now unlikely. This infection can also be contracted through intravenous drug use and the sharing of needles. It can also be contracted through sexual relationships. The injury caused by alcohol comes from too much use. It is not a few drinks that poison the liver, but years of misuse. Usually there will be some signs and symptoms early with scarring of the liver that suggest the development of better habits. Alcohol should never be used while using acetaminophen. A small dose of both taken together may cause irreversible damage to the liver. HOME CARE INSTRUCTIONS  There is no specific treatment for cirrhosis. However, there are things you can do to avoid making the condition worse.  Rest as needed.  Eat a well-balanced diet. Your caregiver can help you with suggestions.  Vitamin supplements including vitamins A, K, D, and thiamine can help.  A low-salt diet, water restriction, or diuretic medicine may be  needed to reduce fluid retention.  Avoid alcohol. This can be extremely toxic if combined with acetaminophen.  Avoid drugs which are toxic to the liver. Some of these include isoniazid, methyldopa, acetaminophen, anabolic steroids (muscle-building drugs), erythromycin, and oral contraceptives (birth control pills). Check with your caregiver to make sure medicines you are presently taking will not be harmful.  Periodic blood tests may be required. Follow your caregiver's advice regarding the timing of these.  Milk thistle is an herbal remedy which does protect the liver against toxins. However, it will not help once the liver has been scarred. SEEK MEDICAL CARE IF:  You have increasing fatigue or weakness.  You develop swelling of the hands, feet, legs, or face.  You vomit bright red blood, or a coffee ground appearing material.  You have blood in your stools, or the stools turn black and tarry.  You have a fever.  You develop loss of appetite, or have nausea and vomiting.  You develop jaundice.  You develop easy bruising or bleeding.  You have worsening of any of the problems you are concerned about. Document Released: 04/02/2005 Document Revised: 06/25/2011 Document Reviewed: 11/19/2007 [facility] Patient Information 2015 [place], [place]. This information is not intended to replace advice given to you by your health care provider. Make sure you discuss any questions you have with your health care provider.  Abdominal Pain, Women Abdominal (stomach, pelvic, or belly) pain can be caused by many things. It is important to tell your doctor:  The location of the pain.  Does it come and go or is it present  all the time?  Are there things that start the pain (eating certain foods, exercise)?  Are there other symptoms associated with the pain (fever, nausea, vomiting, diarrhea)? All of this is helpful to know when trying to find the cause of the pain. CAUSES   Stomach: virus or  bacteria infection, or ulcer.  Intestine: appendicitis (inflamed appendix), regional ileitis (Crohn's disease), ulcerative colitis (inflamed colon), irritable bowel syndrome, diverticulitis (inflamed diverticulum of the colon), or cancer of the stomach or intestine.  Gallbladder disease or stones in the gallbladder.  Kidney disease, kidney stones, or infection.  Pancreas infection or cancer.  Fibromyalgia (pain disorder).  Diseases of the female organs:  Uterus: fibroid (non-cancerous) tumors or infection.  Fallopian tubes: infection or tubal pregnancy.  Ovary: cysts or tumors.  Pelvic adhesions (scar tissue).  Endometriosis (uterus lining tissue growing in the pelvis and on the pelvic organs).  Pelvic congestion syndrome (female organs filling up with blood just before the menstrual period).  Pain with the menstrual period.  Pain with ovulation (producing an egg).  Pain with an IUD (intrauterine device, birth control) in the uterus.  Cancer of the female organs.  Functional pain (pain not caused by a disease, may improve without treatment).  Psychological pain.  Depression. DIAGNOSIS  Your doctor will decide the seriousness of your pain by doing an examination.  Blood tests.  X-rays.  Ultrasound.  CT scan (computed tomography, special type of X-ray).  MRI (magnetic resonance imaging).  Cultures, for infection.  Barium enema (dye inserted in the large intestine, to better view it with X-rays).  Colonoscopy (looking in intestine with a lighted tube).  Laparoscopy (minor surgery, looking in abdomen with a lighted tube).  Major abdominal exploratory surgery (looking in abdomen with a large incision). TREATMENT  The treatment will depend on the cause of the pain.   Many cases can be observed and treated at home.  Over-the-counter medicines recommended by your caregiver.  Prescription medicine.  Antibiotics, for infection.  Birth control pills, for  painful periods or for ovulation pain.  Hormone treatment, for endometriosis.  Nerve blocking injections.  Physical therapy.  Antidepressants.  Counseling with a psychologist or psychiatrist.  Minor or major surgery. HOME CARE INSTRUCTIONS   Do not take laxatives, unless directed by your caregiver.  Take over-the-counter pain medicine only if ordered by your caregiver. Do not take aspirin because it can cause an upset stomach or bleeding.  Try a clear liquid diet (broth or water) as ordered by your caregiver. Slowly move to a bland diet, as tolerated, if the pain is related to the stomach or intestine.  Have a thermometer and take your temperature several times a day, and record it.  Bed rest and sleep, if it helps the pain.  Avoid sexual intercourse, if it causes pain.  Avoid stressful situations.  Keep your follow-up appointments and tests, as your caregiver orders.  If the pain does not go away with medicine or surgery, you may try:  Acupuncture.  Relaxation exercises (yoga, meditation).  Group therapy.  Counseling. SEEK MEDICAL CARE IF:   You notice certain foods cause stomach pain.  Your home care treatment is not helping your pain.  You need stronger pain medicine.  You want your IUD removed.  You feel faint or lightheaded.  You develop nausea and vomiting.  You develop a rash.  You are having side effects or an allergy to your medicine. SEEK IMMEDIATE MEDICAL CARE IF:   Your pain does not go away  or gets worse.  You have a fever.  Your pain is felt only in portions of the abdomen. The right side could possibly be appendicitis. The left lower portion of the abdomen could be colitis or diverticulitis.  You are passing blood in your stools (bright red or black tarry stools, with or without vomiting).  You have blood in your urine.  You develop chills, with or without a fever.  You pass out. MAKE SURE YOU:   Understand these  instructions.  Will watch your condition.  Will get help right away if you are not doing well or get worse. Document Released: 01/28/2007 Document Revised: 08/17/2013 Document Reviewed: 02/17/2009 [facility] Patient Information 2015 [place], [place]. This information is not intended to replace advice given to you by your health care provider. Make sure you discuss any questions you have with your health care provider.

## 2014-07-02 ENCOUNTER — Emergency Department (HOSPITAL_COMMUNITY)
Admission: EM | Admit: 2014-07-02 | Discharge: 2014-07-02 | Disposition: A | Discharge status: Home / Self Care | Payer: Self-pay | Attending: Emergency Medicine | Admitting: Emergency Medicine

## 2014-07-02 ENCOUNTER — Encounter (HOSPITAL_COMMUNITY): Payer: Self-pay

## 2014-07-02 DIAGNOSIS — Z720 Tobacco use: Secondary | ICD-10-CM | POA: Insufficient documentation

## 2014-07-02 DIAGNOSIS — E669 Obesity, unspecified: Secondary | ICD-10-CM | POA: Insufficient documentation

## 2014-07-02 DIAGNOSIS — I10 Essential (primary) hypertension: Secondary | ICD-10-CM | POA: Insufficient documentation

## 2014-07-02 DIAGNOSIS — G43909 Migraine, unspecified, not intractable, without status migrainosus: Secondary | ICD-10-CM | POA: Insufficient documentation

## 2014-07-02 DIAGNOSIS — R198 Other specified symptoms and signs involving the digestive system and abdomen: Secondary | ICD-10-CM

## 2014-07-02 DIAGNOSIS — I868 Varicose veins of other specified sites: Secondary | ICD-10-CM

## 2014-07-02 DIAGNOSIS — R109 Unspecified abdominal pain: Secondary | ICD-10-CM

## 2014-07-02 DIAGNOSIS — Z79899 Other long term (current) drug therapy: Secondary | ICD-10-CM | POA: Insufficient documentation

## 2014-07-02 DIAGNOSIS — Z8659 Personal history of other mental and behavioral disorders: Secondary | ICD-10-CM | POA: Insufficient documentation

## 2014-07-02 LAB — CBC WITH DIFFERENTIAL/PLATELET
BASOS ABS: 0 10*3/uL (ref 0.0–0.1)
BASOS PCT: 0 % (ref 0–1)
EOS ABS: 0.2 10*3/uL (ref 0.0–0.7)
Eosinophils Relative: 3 % (ref 0–5)
HCT: 35.6 % — ABNORMAL LOW (ref 36.0–46.0)
Hemoglobin: 12.2 g/dL (ref 12.0–15.0)
Lymphocytes Relative: 26 % (ref 12–46)
Lymphs Abs: 1.9 10*3/uL (ref 0.7–4.0)
MCH: 36.2 pg — AB (ref 26.0–34.0)
MCHC: 34.3 g/dL (ref 30.0–36.0)
MCV: 105.6 fL — ABNORMAL HIGH (ref 78.0–100.0)
Monocytes Absolute: 0.7 10*3/uL (ref 0.1–1.0)
Monocytes Relative: 10 % (ref 3–12)
NEUTROS PCT: 61 % (ref 43–77)
Neutro Abs: 4.5 10*3/uL (ref 1.7–7.7)
Platelets: 137 10*3/uL — ABNORMAL LOW (ref 150–400)
RBC: 3.37 MIL/uL — ABNORMAL LOW (ref 3.87–5.11)
RDW: 13.1 % (ref 11.5–15.5)
WBC: 7.3 10*3/uL (ref 4.0–10.5)

## 2014-07-02 LAB — COMPREHENSIVE METABOLIC PANEL
ALT: 35 U/L (ref 0–35)
ANION GAP: 6 (ref 5–15)
AST: 85 U/L — ABNORMAL HIGH (ref 0–37)
Albumin: 2.7 g/dL — ABNORMAL LOW (ref 3.5–5.2)
Alkaline Phosphatase: 103 U/L (ref 39–117)
CALCIUM: 8.7 mg/dL (ref 8.4–10.5)
CO2: 25 mmol/L (ref 19–32)
Chloride: 102 mmol/L (ref 96–112)
Creatinine, Ser: 0.76 mg/dL (ref 0.50–1.10)
GFR calc non Af Amer: >90 mL/min (ref >90)
GLUCOSE: 99 mg/dL (ref 70–99)
Potassium: 3.6 mmol/L (ref 3.5–5.1)
SODIUM: 133 mmol/L — AB (ref 135–145)
TOTAL PROTEIN: 6.6 g/dL (ref 6.0–8.3)
Total Bilirubin: 2.6 mg/dL — ABNORMAL HIGH (ref 0.3–1.2)

## 2014-07-02 MED ORDER — ONDANSETRON HCL 4 MG/2ML IJ SOLN
4.0000 mg | Freq: Once | INTRAMUSCULAR | Status: DC
  Filled 2014-07-02: qty 2

## 2014-07-02 MED ORDER — LIDOCAINE HCL (PF) 1 % IJ SOLN
2.0000 mL | Freq: Once | INTRAMUSCULAR | Status: AC
  Administered 2014-07-02: 2 mL

## 2014-07-02 MED ORDER — FENTANYL CITRATE 0.05 MG/ML IJ SOLN
50.0000 ug | Freq: Once | INTRAMUSCULAR | Status: AC
  Administered 2014-07-02: 50 ug via INTRAVENOUS
  Filled 2014-07-02: qty 2

## 2014-07-02 MED ORDER — MORPHINE SULFATE 4 MG/ML IJ SOLN
4.0000 mg | Freq: Once | INTRAMUSCULAR | Status: AC
  Administered 2014-07-02: 4 mg via INTRAVENOUS
  Filled 2014-07-02: qty 1

## 2014-07-02 MED ORDER — LIDOCAINE HCL (PF) 1 % IJ SOLN
INTRAMUSCULAR | Status: AC
  Filled 2014-07-02: qty 5

## 2014-07-02 NOTE — ED Notes (Signed)
Dr early at bedside placing suture

## 2014-07-02 NOTE — ED Provider Notes (Signed)
CSN: [id number]     Arrival date & time 07/02/14  0502 History   First MD Initiated Contact with Patient 07/02/14 [id number]     Chief Complaint  Patient presents with  . Abdominal Injury     (Consider location/radiation/quality/duration/timing/severity/associated sxs/prior Treatment) HPI Julie Rowland is a 44 y.o. female with a history of hypertension, anxiety comes in for evaluation of bleeding from her navel. Patient states she has had chronic abdominal "problems" for the past few years, but 2 days ago she began to start bleeding from her bellybutton and went to [facility] for treatment. She further characterizes her abdominal problems as generalized swelling in her abdomen. She reports being told that if she could stop the bleeding she would be fine and the bleeding had stopped until this morning at 12 AM when she noticed her nightgown and bedsheets were covered in blood after she tried to roll over in bed. She denies any bleeding now laying flat in the ED, but reports as soon as she stands she will begin to bleed again. She denies taking any anticoagulation. Reports generalized abdominal pain to her umbilicus and periumbilical region. She reports she has had this same pain in her abdomen for the past year but it will frequently move from lower left quadrant to right lower quadrant to upper abdomen. She characterizes the pain as a constant "stomach ache" and rates it as a 10/10. Denies fevers, urinary symptoms, diarrhea or constipation, nausea or vomiting, skin changes. 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 No hx varicose veins or portal htn.  Past Medical History  Diagnosis Date  . Migraine headache   . Hypertension   . Anxiety   . Seizures 2014 and 2009   History reviewed. No pertinent past surgical history. Family History  Problem Relation Age of Onset  . Hypertension Mother   . Cancer Mother   .  Diabetes Mother    History  Substance Use Topics  . Smoking status: Current Every Day Smoker -- 20 years    Types: Cigarettes  . Smokeless tobacco: Not on file     Comment: cut back to 4-5 cigarettes per day  . Alcohol Use: 0.0 oz/week    0 Standard drinks or equivalent per week     Comment: occasionally   OB History    No data available     Review of Systems A 10 point review of systems was completed and was negative except for pertinent positives and negatives as mentioned in the history of present illness     Allergies  Review of patient's allergies indicates no known allergies.  Home Medications   Prior to Admission medications   Medication Sig Start Date End Date Taking? Authorizing Provider  cyclobenzaprine (FLEXERIL) 10 MG tablet Take 1 tablet (10 mg total) by mouth 2 (two) times daily as needed for muscle spasms (caution will cause drowsiness). For headache per patient 10/09/12  Yes Clanford Cyndie Mull, MD  hydrochlorothiazide (HYDRODIURIL) 25 MG tablet Take 1 tablet (25 mg total) by mouth daily. 06/29/14  Yes Quentin Angst, MD  zinc gluconate 50 MG tablet Take 50 mg by mouth daily as needed (cold).    Yes Historical Provider, MD  zolpidem (AMBIEN) 10 MG tablet Take 10 mg by mouth at bedtime as needed for sleep.   Yes Historical Provider, MD  dicyclomine (BENTYL) 20 MG tablet Take 1 tablet (20 mg  total) by mouth 2 (two) times daily. Patient not taking: Reported on 06/30/2014 04/12/14   Lemont Fillers Kirichenko, PA-C  famotidine (PEPCID) 20 MG tablet Take 1 tablet (20 mg total) by mouth 2 (two) times daily. Patient not taking: Reported on 06/22/2014 04/12/14   Tatyana Kirichenko, PA-C  furosemide (LASIX) 40 MG tablet Take 1 tablet (40 mg total) by mouth daily. Patient not taking: Reported on 06/30/2014 06/22/14   Dessa Phi, MD  potassium chloride (K-DUR) 10 MEQ tablet Take 1 tablet (10 mEq total) by mouth daily. Patient not taking: Reported on 07/02/2014 07/01/14   Everlene Farrier, PA-C  sucralfate (CARAFATE) 1 G tablet Take 1 tablet (1 g total) by mouth 4 (four) times daily -  with meals and at bedtime. Patient not taking: Reported on 06/30/2014 04/12/14   Tatyana Kirichenko, PA-C  traMADol (ULTRAM) 50 MG tablet Take 1 tablet (50 mg total) by mouth every 6 (six) hours as needed for moderate pain or severe pain. Patient not taking: Reported on 07/02/2014 07/01/14   Everlene Farrier, PA-C   BP 114/69 mmHg  Pulse 91  Temp(Src) 97.6 F (36.4 C) (Oral)  Resp 16  Ht  (1.753 m)  Wt 235 lb (106.595 kg)  BMI 34.69 kg/m2  SpO2 96% Physical Exam  Constitutional: She is oriented to person, place, and time. She appears well-developed and well-nourished.  Obese.  HENT:  Head: Normocephalic and atraumatic.  Mouth/Throat: Oropharynx is clear and moist.  Eyes: Conjunctivae are normal. Pupils are equal, round, and reactive to light. Right eye exhibits no discharge. Left eye exhibits no discharge. No scleral icterus.  Neck: Neck supple.  Cardiovascular: Normal rate, regular rhythm and normal heart sounds.   Pulmonary/Chest: Effort normal and breath sounds normal. No respiratory distress. She has no wheezes. She has no rales.  Abdominal: Soft. There is no tenderness.  Umbilicus with a small area of clotted blood from superficial varicosity at 9:00 position. No active bleeding at this time. No hernia noted. No other varicosities noted. Bowel sounds active. No overt organomegaly. Mild tenderness to umbilicus and periumbilical region. No other tenderness to palpation noted. Abdomen is soft with mild distention. Non-tympanic. No other lesions or deformities noted.  Musculoskeletal: She exhibits no tenderness.  Neurological: She is alert and oriented to person, place, and time.  Cranial Nerves II-XII grossly intact  Skin: Skin is warm and dry. No rash noted.  Patient skin somewhat dark. Patient admits to frequently laying in tanning bed and skin color is not unusual for her.   Psychiatric: She has a normal mood and affect.  Nursing note and vitals reviewed.   ED Course  Procedures (including critical care time) Labs Review Labs Reviewed  CBC WITH DIFFERENTIAL/PLATELET - Abnormal; Notable for the following:    RBC 3.37 (*)    HCT 35.6 (*)    MCV 105.6 (*)    MCH 36.2 (*)    Platelets 137 (*)    All other components within normal limits  COMPREHENSIVE METABOLIC PANEL - Abnormal; Notable for the following:    Sodium 133 (*)    BUN <5 (*)    Albumin 2.7 (*)    AST 85 (*)    Total Bilirubin 2.6 (*)    All other components within normal limits    Imaging Review Ct Abdomen Pelvis W Contrast  07/01/2014   CLINICAL DATA:  Chronic abdominal distention for 2 months. The from the umbilicus. Initial encounter.  EXAM: CT ABDOMEN AND PELVIS WITH CONTRAST  TECHNIQUE:  Multidetector CT imaging of the abdomen and pelvis was performed using the standard protocol following bolus administration of intravenous contrast.  CONTRAST:  OMNIPAQUE IOHEXOL 300 MG/ML SOLN, 50mL OMNIPAQUE IOHEXOL 300 MG/ML SOLN  COMPARISON:  CT of the abdomen and pelvis from 05/08/2011, and abdominal ultrasound performed 04/12/2014  FINDINGS: The visualized lung bases are clear.  Areas of heterogeneity are seen within the liver, somewhat nodular in appearance. Though this could simply reflect fatty infiltration, underlying masses cannot be excluded. Dynamic liver protocol MRI or CT would be helpful for further evaluation. The presence of predominantly new varices, as described below, raises concern for mild hepatic cirrhosis.  The spleen is unremarkable in appearance. The gallbladder is grossly unremarkable. The pancreas and adrenal glands are within normal limits.  There is recanalization of the umbilical vein, more prominent than on the prior study, with significantly increased prominence of dilated vessels at the umbilicus, likely corresponding to the site of the patient's bleeding. Scattered gastric  and splenic varices are seen.  The kidneys are unremarkable in appearance. There is no evidence of hydronephrosis. No renal or ureteral stones are seen. No perinephric stranding is appreciated.  No free fluid is identified. The small bowel is unremarkable in appearance. The stomach is within normal limits. No acute vascular abnormalities are seen. Mild calcification is seen along the abdominal aorta and its branches.  There is somewhat unusual prominence of the venous vasculature within the retroperitoneum.  The appendix is normal in caliber, without evidence for appendicitis.  Trace fluid is seen tracking along the paracolic gutters bilaterally, new from the prior study. There is vague soft tissue inflammation and vascular prominence about the ascending and descending colon, which may reflect a mild infectious or inflammatory process. Underlying chronic fatty infiltration of the wall is again noted, reflecting sequelae of chronic inflammation.  The bladder is mildly distended and grossly unremarkable. The uterus is unremarkable in appearance, with an intrauterine device noted in expected position at the fundus of the uterus. The ovaries are relatively symmetric. No suspicious adnexal masses are seen. No inguinal lymphadenopathy is seen.  No acute osseous abnormalities are identified.  IMPRESSION: 1. Recanalization of the umbilical vein, more prominent than on the prior study, though significantly increased prominence of dilated veins of the umbilicus, likely corresponding to the site of the patient's bleeding. Scattered gastric and splenic varices noted. Somewhat unusual prominence of the venous vasculature within the retroperitoneum. 2. Findings raise suspicion for underlying mild hepatic cirrhosis. Would correlate with hepatic labs and hepatitis panels. 3. Focal areas of heterogeneity seen within the liver, somewhat nodular in appearance. Though this could simply reflect fatty infiltration, underlying masses  cannot be excluded. Dynamic liver protocol MRI or CT would be helpful for further evaluation, when and as deemed clinically appropriate. 4. Trace fluid tracking along the paracolic gutters bilaterally, new from the prior study. Vague soft tissue inflammation and vascular prominence about the ascending and descending colon, which may reflect a mild infectious or inflammatory process. Underlying chronic fatty infiltration of the colonic wall again noted, reflecting sequelae of chronic inflammation. 5. Mild calcification along the abdominal aorta and its branches.   Electronically Signed   By: Roanna Raider M.D.   On: 07/01/2014 00:52     EKG Interpretation None     Meds given in ED:  Medications  fentaNYL (SUBLIMAZE) injection 50 mcg (50 mcg Intravenous Given 07/02/14 0756)  morphine 4 MG/ML injection 4 mg (4 mg Intravenous Given 07/02/14 0955)  lidocaine (PF) (XYLOCAINE)  1 % injection 2 mL (2 mLs Other Given 07/02/14 1055)    Discharge Medication List as of 07/02/2014 11:38 AM     Filed Vitals:   07/02/14 1030 07/02/14 1045 07/02/14 1100 07/02/14 1131  BP: 115/57 120/62 116/69 114/69  Pulse: 94 93 94 91  Temp:    97.6 F (36.4 C)  TempSrc:    Oral  Resp:    16  Height:      Weight:      SpO2: 97% 95% 93% 96%    MDM  Vitals stable - WNL -afebrile Pt resting comfortably in ED. PE-No evidence of acute or surgical abdomen. Superficial umbilicus varicosity with hemostasis achieved. Labwork essentially stable and noncontributory, however, Hgb 12.2 today compared to 13.9 two days ago Imaging--Previous CT abd shows recanalization of umbilical vein as well as mild hepatic cirrhosis.  Vascular Surgery, Dr. Arbie CookeyEarly saw pt in ED, placed suture in umbilical vessel and will see pt in 10 days for suture removal and further follow up. Low concern for serious bleeding event related to varicosity bleed. No bleeding in ED. Provided GI referral for follow up on liver function and abnormal imaging  results. Discussed need to f/u with PCP for hgb monitoring and further evaluation of abd discomfort.  I discussed all relevant lab findings and imaging results with pt and they verbalized understanding. Discussed f/u with PCP within 48 hrs and return precautions, pt very amenable to plan. Pt stable, in good condition and is appropriate for discharge.  Prior to patient discharge, I discussed and reviewed this case with Dr.Zavitz and Dr. Preston FleetingGlick     Final diagnoses:  Caput medusae  Umbilical bleeding       Joycie PeekBenjamin [employer] Corp, PA-C 07/02/14 2003  Blane OharaJoshua Zavitz, MD 07/03/14 [phone]

## 2014-07-02 NOTE — Progress Notes (Signed)
Patient ID: WAUNELL ARVIE, female   DOB: October 16, 1970, 44 y.o.   MRN: [id number]     Patient name: Julie Rowland MRN: [id number] DOB: 05-03-1970 Sex: female   Referred by: EDP  Reason for referral:  Chief Complaint  Patient presents with  . Abdominal Injury    HISTORY OF PRESENT ILLNESS: Patient presents to the emergency room today for bleeding from her umbilicus. She was seen yesterday at [facility] long emergency room with same difficulty. This stopped spontaneously and she was discharged home. She began bleeding again through the night and presented to [facility] emergency room for further evaluation. She has had no prior history of this. She does have some history of occasional lower from edema but no history of venous varicosities. She is obese. She does report a prior history of heavy drinking but less so recently. Does have documented past history of mildly elevated liver function studies. No history of portal hypertension or esophageal bleeding. She does not have any pain associated with this. She does have some tenderness from a great deal of pressure over these areas for the last 2 days related to the bleeding. She does not recall seeing any varicosities but didn't notice a fullness in her umbilicus. No prior bleeding and no known bleeding disorder.  Past Medical History  Diagnosis Date  . Migraine headache   . Hypertension   . Anxiety   . Seizures 2014 and 2009    History reviewed. No pertinent past surgical history.  History   Social History  . Marital Status: Married    Spouse Name: N/A  . Number of Children: N/A  . Years of Education: N/A   Occupational History  . Not on file.   Social History Main Topics  . Smoking status: Current Every Day Smoker -- 20 years    Types: Cigarettes  . Smokeless tobacco: Not on file     Comment: cut back to 4-5 cigarettes per day  . Alcohol Use: 0.0 oz/week    0 Standard drinks or equivalent per week     Comment: occasionally    . Drug Use: No  . Sexual Activity: Yes    Birth Control/ Protection: IUD   Other Topics Concern  . Not on file   Social History Narrative    Family History  Problem Relation Age of Onset  . Hypertension Mother   . Cancer Mother   . Diabetes Mother     Allergies as of 07/02/2014  . (No Known Allergies)    Current Facility-Administered Medications on File Prior to Encounter  Medication Dose Route Frequency Provider Last Rate Last Dose  . levonorgestrel (MIRENA) 20 MCG/24HR IUD   Intrauterine Once Dessa Phi, MD       Current Outpatient Prescriptions on File Prior to Encounter  Medication Sig Dispense Refill  . cyclobenzaprine (FLEXERIL) 10 MG tablet Take 1 tablet (10 mg total) by mouth 2 (two) times daily as needed for muscle spasms (caution will cause drowsiness). For headache per patient 45 tablet 1  . hydrochlorothiazide (HYDRODIURIL) 25 MG tablet Take 1 tablet (25 mg total) by mouth daily. 90 tablet 3  . zinc gluconate 50 MG tablet Take 50 mg by mouth daily as needed (cold).     . zolpidem (AMBIEN) 10 MG tablet Take 10 mg by mouth at bedtime as needed for sleep.    Marland Kitchen dicyclomine (BENTYL) 20 MG tablet Take 1 tablet (20 mg total) by mouth 2 (two) times daily. (Patient not taking: Reported on  06/30/2014) 20 tablet 0  . famotidine (PEPCID) 20 MG tablet Take 1 tablet (20 mg total) by mouth 2 (two) times daily. (Patient not taking: Reported on 06/22/2014) 30 tablet 0  . furosemide (LASIX) 40 MG tablet Take 1 tablet (40 mg total) by mouth daily. (Patient not taking: Reported on 06/30/2014) 10 tablet 0  . potassium chloride (K-DUR) 10 MEQ tablet Take 1 tablet (10 mEq total) by mouth daily. (Patient not taking: Reported on 07/02/2014) 14 tablet 0  . sucralfate (CARAFATE) 1 G tablet Take 1 tablet (1 g total) by mouth 4 (four) times daily -  with meals and at bedtime. (Patient not taking: Reported on 06/30/2014) 30 tablet 0  . traMADol (ULTRAM) 50 MG tablet Take 1 tablet (50 mg total) by  mouth every 6 (six) hours as needed for moderate pain or severe pain. (Patient not taking: Reported on 07/02/2014) 15 tablet 0     REVIEW OF SYSTEMS:  Reviewed in her medical record with nothing to add. No history of cirrhosis  PHYSICAL EXAMINATION:  General: The patient is a well-nourished female, in no acute distress. Vital signs are BP 114/69 mmHg  Pulse 91  Temp(Src) 97.6 F (36.4 C) (Oral)  Resp 16  Ht 5\' 9"  (1.753 m)  Wt 235 lb (106.595 kg)  BMI 34.69 kg/m2  SpO2 96% Pulmonary: There is a good air exchange Abdomen: Soft and non-tende.  Obese. She does have a dressing intact over her umbilicus is was removed. She does have very slight reticular veins present over her abdomen but no caput medusa.  She does have the area and the very depths of her umbilicus with a superficial varicosity with erosion where she has had bleeding. Musculoskeletal: There are no major deformities.  There is no significant extremity pain. Neurologic: No focal weakness or paresthesias are detected, Skin: There are no ulcer or rashes noted. Psychiatric: The patient has normal affect. Cardiovascular: 2+ dorsalis pedis pulses bilaterally  CT scan from yesterday was reviewed. This does show some irregularity in her liver and some small gastric, splenic and retroperitoneal varices. These are not particularly prominent. She does have some a prominent superficial veins arising from her femoral vein to extend just under the surface of her abdomen and do communicate with the veins at the level of her umbilicus which have bled. These are not particularly enlarged.  Impression and Plan:  Leading now on 2 separate days now with the erosion of a varus the and her umbilicus. Discussed this at great length with the patient. Explained this is somewhat unusual presentation. I does not appear to have severe portal hypertension but this has to be part of the cause of this. Sewn that she may continue to have rebleeding since  she is now bled twice. Explained that suturing this should at least temporize the bleeding. Explained that this may cause some erosion in the skin. The continue to have some bleeding. Explained also the option of sclerotherapy of these varices if she continues to have difficulty.  Procedure: Under sterile conditions 3 and local anesthesia with 1% lidocaine local. A mattress suture of 3-0 nylon was placed around this area varices for Not. Care was taken not to cause a tight closure of the skin to prevent infarction of the skin. Patient tolerated chief without any difficulty. There was no ongoing bleeding. Will be discharged home. He'll be seen in the office in 10 days for suture removal and further follow-up    Drue Harr [facility] [place] Office: [phone]

## 2014-07-02 NOTE — ED Notes (Signed)
[facility], has navel bleeding from inside belly button, [facility] same [facility], then bleeding started again.

## 2014-07-02 NOTE — ED Notes (Signed)
Pt called out, bleeding from abd, saturated dressing and linens on bed, new pressure dressing applied.

## 2014-07-02 NOTE — Discharge Instructions (Signed)
Please follow-up with Dr. Arbie CookeyEarly for suture removal and further evaluation of your umbilical bleeding. Please follow-up with Dr. Marina GoodellPerry, gastroenterology for further evaluation and management of your abnormal liver test. Return to ED for new or worsening symptoms. If you were given medicines take as directed.  If you are on coumadin or contraceptives realize their levels and effectiveness is altered by many different medicines.  If you have any reaction (rash, tongues swelling, other) to the medicines stop taking and see a physician.   Please follow up as directed and return to the ER or see a physician for new or worsening symptoms or bleeding.  Thank you. Filed Vitals:   07/02/14 1015 07/02/14 1030 07/02/14 1045 07/02/14 1100  BP: 111/64 115/57 120/62 116/69  Pulse: 93 94 93 94  Temp:      TempSrc:      Resp:      Height:      Weight:      SpO2: 96% 97% 95% 93%

## 2014-07-05 ENCOUNTER — Telehealth: Payer: Self-pay | Admitting: Vascular Surgery

## 2014-07-05 NOTE — Telephone Encounter (Signed)
LM for patient regarding appt- dpm

## 2014-07-05 NOTE — Telephone Encounter (Signed)
-----   Message from Sharee PimpleMarilyn K McChesney, RN sent at 07/02/2014  1:14 PM EDT ----- Regarding: Schedule   ----- Message -----    From: Larina Earthlyodd F Early, MD    Sent: 07/02/2014  12:17 PM      To: [facility] Charge Pool  She was seen in the ER for bleeding from varices in her umbilicus. This was controlled with 3-0 nylon suture in the ER. Mid-level consult. The patient will be discharged home on need to see her in the office in about 10 days for removal of suture at her umbilicus.

## 2014-07-12 ENCOUNTER — Telehealth: Payer: Self-pay

## 2014-07-12 ENCOUNTER — Encounter: Payer: Self-pay | Admitting: Vascular Surgery

## 2014-07-12 NOTE — Telephone Encounter (Signed)
Pt. called to report 2 episodes of bleeding from umbilicus; one occurrence 3/27, and one occurrence today for "a few minutes."  Stated she applied pressure with gauze, and was able to stop the bleeding.  Denies bleeding at present time.  Has appt. @ 9:00 AM 3/29 with Dr. Arbie CookeyEarly.  Advised to rest quietly tonight, and to keep appt. in AM; instructed if bleeding starts again and is unable to stop the bleeding, then needs to go to the ER tonight.  Verb. Understanding.

## 2014-07-13 ENCOUNTER — Ambulatory Visit (INDEPENDENT_AMBULATORY_CARE_PROVIDER_SITE_OTHER): Payer: No Typology Code available for payment source | Admitting: Vascular Surgery

## 2014-07-13 ENCOUNTER — Encounter: Payer: Self-pay | Admitting: Vascular Surgery

## 2014-07-13 ENCOUNTER — Other Ambulatory Visit: Payer: Self-pay

## 2014-07-13 DIAGNOSIS — R1030 Lower abdominal pain, unspecified: Secondary | ICD-10-CM

## 2014-07-13 NOTE — Progress Notes (Signed)
Here today for follow-up from ER visit on 07/02/2014. Very unusual situation with persistent bleeding from her umbilicus. Workup including a CT scan showed plexus of varicosities around her umbilicus. She did have some irregularity in her liver with small gastric splenic and retroperitoneal varices she does have some prominent surface veins from her femoral vein extending to the surface of her abdomen on the CT scan as well. She had the nylon sutures placed in the emergency department that controlled the bleeding. There was a very proximal superficial plexus of varicosities in her umbilicus. Since she has had bleeding off and on since this procedure soaking cotton gauze pad only. No major bleeding.  Past Medical History  Diagnosis Date  . Migraine headache   . Hypertension   . Anxiety   . Seizures 2014 and 2009    History  Substance Use Topics  . Smoking status: Current Every Day Smoker -- 20 years    Types: Cigarettes  . Smokeless tobacco: Never Used     Comment: cut back to 4-5 cigarettes per day  . Alcohol Use: 0.0 oz/week    0 Standard drinks or equivalent per week     Comment: occasionally    Family History  Problem Relation Age of Onset  . Hypertension Mother   . Cancer Mother   . Diabetes Mother     No Known Allergies   Current outpatient prescriptions:  .  cyclobenzaprine (FLEXERIL) 10 MG tablet, Take 1 tablet (10 mg total) by mouth 2 (two) times daily as needed for muscle spasms (caution will cause drowsiness). For headache per patient, Disp: 45 tablet, Rfl: 1 .  hydrochlorothiazide (HYDRODIURIL) 25 MG tablet, Take 1 tablet (25 mg total) by mouth daily., Disp: 90 tablet, Rfl: 3 .  traMADol (ULTRAM) 50 MG tablet, Take 1 tablet (50 mg total) by mouth every 6 (six) hours as needed for moderate pain or severe pain., Disp: 15 tablet, Rfl: 0 .  zolpidem (AMBIEN) 10 MG tablet, Take 10 mg by mouth at bedtime as needed for sleep., Disp: , Rfl:  .  dicyclomine (BENTYL) 20 MG  tablet, Take 1 tablet (20 mg total) by mouth 2 (two) times daily. (Patient not taking: Reported on 06/30/2014), Disp: 20 tablet, Rfl: 0 .  famotidine (PEPCID) 20 MG tablet, Take 1 tablet (20 mg total) by mouth 2 (two) times daily. (Patient not taking: Reported on 06/22/2014), Disp: 30 tablet, Rfl: 0 .  furosemide (LASIX) 40 MG tablet, Take 1 tablet (40 mg total) by mouth daily. (Patient not taking: Reported on 06/30/2014), Disp: 10 tablet, Rfl: 0 .  potassium chloride (K-DUR) 10 MEQ tablet, Take 1 tablet (10 mEq total) by mouth daily. (Patient not taking: Reported on 07/02/2014), Disp: 14 tablet, Rfl: 0 .  sucralfate (CARAFATE) 1 G tablet, Take 1 tablet (1 g total) by mouth 4 (four) times daily -  with meals and at bedtime. (Patient not taking: Reported on 06/30/2014), Disp: 30 tablet, Rfl: 0 .  zinc gluconate 50 MG tablet, Take 50 mg by mouth daily as needed (cold). , Disp: , Rfl:   Current facility-administered medications:  .  levonorgestrel (MIRENA) 20 MCG/24HR IUD, , Intrauterine, Once, [name], MD  Filed Vitals:   07/13/14 0844 07/13/14 0848  BP: 162/91 154/88  Pulse: 99 96  Resp: 16   Height: 5' 9" (1.753 m)   Weight: 249 lb (112.946 kg)     Body mass index is 36.75 kg/(m^2).       At her physical exam   is unchanged. She is alert and oriented no acute distress. Respirations are equal in nonlabored. Umbilicus is nontender. She does have sutures in place which were removed. She does have a persistent prominent plexus with very superficial "nipple" that is oozing with suture removal.  Impression and plan: I did discuss with patient very unusual situation. This is related to increased venous pressure for what ever calls. I explained my role would be in the local treatment of this. I feel that she is going to continue to have trouble with bleeding and have recommended this plexus of veins in the operating room under better controlled circumstances. She understands we will proceed with  this tomorrow as an outpatient 

## 2014-07-13 NOTE — Progress Notes (Signed)
Filed Vitals:   07/13/14 0844 07/13/14 0848  BP: 162/91 154/88  Pulse: 99 96  Resp: 16   Height: 5\' 9"  (1.753 m)   Weight: 249 lb (112.946 kg)    Body mass index is 36.75 kg/(m^2).

## 2014-07-14 ENCOUNTER — Ambulatory Visit (HOSPITAL_COMMUNITY): Payer: No Typology Code available for payment source | Admitting: Anesthesiology

## 2014-07-14 ENCOUNTER — Encounter (HOSPITAL_COMMUNITY): Payer: Self-pay | Admitting: *Deleted

## 2014-07-14 ENCOUNTER — Encounter (HOSPITAL_COMMUNITY)
Admission: RE | Disposition: A | Discharge status: Home / Self Care | Payer: Self-pay | Source: Ambulatory Visit | Attending: Vascular Surgery

## 2014-07-14 ENCOUNTER — Ambulatory Visit (HOSPITAL_COMMUNITY)
Admission: RE | Admit: 2014-07-14 | Discharge: 2014-07-14 | Disposition: A | Discharge status: Home / Self Care | Payer: Self-pay | Source: Ambulatory Visit | Attending: Vascular Surgery | Admitting: Vascular Surgery

## 2014-07-14 ENCOUNTER — Ambulatory Visit (HOSPITAL_COMMUNITY): Payer: Self-pay | Admitting: Anesthesiology

## 2014-07-14 DIAGNOSIS — F419 Anxiety disorder, unspecified: Secondary | ICD-10-CM | POA: Insufficient documentation

## 2014-07-14 DIAGNOSIS — I10 Essential (primary) hypertension: Secondary | ICD-10-CM | POA: Insufficient documentation

## 2014-07-14 DIAGNOSIS — F17210 Nicotine dependence, cigarettes, uncomplicated: Secondary | ICD-10-CM | POA: Insufficient documentation

## 2014-07-14 DIAGNOSIS — G43909 Migraine, unspecified, not intractable, without status migrainosus: Secondary | ICD-10-CM | POA: Insufficient documentation

## 2014-07-14 DIAGNOSIS — Z419 Encounter for procedure for purposes other than remedying health state, unspecified: Secondary | ICD-10-CM

## 2014-07-14 DIAGNOSIS — I868 Varicose veins of other specified sites: Secondary | ICD-10-CM | POA: Insufficient documentation

## 2014-07-14 DIAGNOSIS — K420 Umbilical hernia with obstruction, without gangrene: Secondary | ICD-10-CM | POA: Insufficient documentation

## 2014-07-14 DIAGNOSIS — K429 Umbilical hernia without obstruction or gangrene: Secondary | ICD-10-CM

## 2014-07-14 HISTORY — PX: VEIN LIGATION AND STRIPPING: SHX2653

## 2014-07-14 HISTORY — PX: UMBILICAL HERNIA REPAIR: SHX196

## 2014-07-14 LAB — SURGICAL PCR SCREEN
MRSA, PCR: NEGATIVE
Staphylococcus aureus: NEGATIVE

## 2014-07-14 LAB — POCT I-STAT 4, (NA,K, GLUC, HGB,HCT)
GLUCOSE: 99 mg/dL (ref 70–99)
HCT: 37 % (ref 36.0–46.0)
HEMOGLOBIN: 12.6 g/dL (ref 12.0–15.0)
Potassium: 4.1 mmol/L (ref 3.5–5.1)
SODIUM: 140 mmol/L (ref 135–145)

## 2014-07-14 SURGERY — LIGATION AND STRIPPING, VARICOSE VEIN
Anesthesia: General | Site: Abdomen

## 2014-07-14 MED ORDER — OXYCODONE HCL 5 MG PO TABS
5.0000 mg | ORAL_TABLET | Freq: Once | ORAL | Status: AC | PRN
  Administered 2014-07-14: 5 mg via ORAL

## 2014-07-14 MED ORDER — DEXAMETHASONE SODIUM PHOSPHATE 4 MG/ML IJ SOLN
INTRAMUSCULAR | Status: DC | PRN
  Administered 2014-07-14: 4 mg via INTRAVENOUS

## 2014-07-14 MED ORDER — FENTANYL CITRATE 0.05 MG/ML IJ SOLN
25.0000 ug | INTRAMUSCULAR | Status: DC | PRN
  Administered 2014-07-14 (×3): 50 ug via INTRAVENOUS

## 2014-07-14 MED ORDER — LACTATED RINGERS IV SOLN
INTRAVENOUS | Status: DC
  Administered 2014-07-14: 11:00:00 via INTRAVENOUS

## 2014-07-14 MED ORDER — ARTIFICIAL TEARS OP OINT
TOPICAL_OINTMENT | OPHTHALMIC | Status: DC | PRN
  Administered 2014-07-14: 1 via OPHTHALMIC

## 2014-07-14 MED ORDER — PROPOFOL 10 MG/ML IV BOLUS
INTRAVENOUS | Status: DC | PRN
  Administered 2014-07-14: 70 mg via INTRAVENOUS
  Administered 2014-07-14: 30 mg via INTRAVENOUS
  Administered 2014-07-14: 100 mg via INTRAVENOUS
  Administered 2014-07-14: 200 mg via INTRAVENOUS

## 2014-07-14 MED ORDER — ROCURONIUM BROMIDE 50 MG/5ML IV SOLN
INTRAVENOUS | Status: AC
  Filled 2014-07-14: qty 1

## 2014-07-14 MED ORDER — NEOSTIGMINE METHYLSULFATE 10 MG/10ML IV SOLN
INTRAVENOUS | Status: AC
  Filled 2014-07-14: qty 1

## 2014-07-14 MED ORDER — LACTATED RINGERS IV SOLN
INTRAVENOUS | Status: DC | PRN
  Administered 2014-07-14 (×2): via INTRAVENOUS

## 2014-07-14 MED ORDER — ONDANSETRON HCL 4 MG/2ML IJ SOLN: 4.0000 mg | Freq: Once | INTRAMUSCULAR | Status: DC | PRN

## 2014-07-14 MED ORDER — 0.9 % SODIUM CHLORIDE (POUR BTL) OPTIME
TOPICAL | Status: DC | PRN
  Administered 2014-07-14: 1000 mL

## 2014-07-14 MED ORDER — FENTANYL CITRATE 0.05 MG/ML IJ SOLN
INTRAMUSCULAR | Status: AC
  Filled 2014-07-14: qty 5

## 2014-07-14 MED ORDER — DEXAMETHASONE SODIUM PHOSPHATE 4 MG/ML IJ SOLN
INTRAMUSCULAR | Status: AC
  Filled 2014-07-14: qty 1

## 2014-07-14 MED ORDER — FENTANYL CITRATE 0.05 MG/ML IJ SOLN
INTRAMUSCULAR | Status: AC
  Administered 2014-07-14: 50 ug via INTRAVENOUS
  Filled 2014-07-14: qty 2

## 2014-07-14 MED ORDER — OXYCODONE HCL 5 MG/5ML PO SOLN: 5.0000 mg | Freq: Once | ORAL | Status: AC | PRN

## 2014-07-14 MED ORDER — DEXTROSE 5 % IV SOLN
1.5000 g | INTRAVENOUS | Status: AC
  Administered 2014-07-14: 1.5 g via INTRAVENOUS
  Filled 2014-07-14: qty 1.5

## 2014-07-14 MED ORDER — FENTANYL CITRATE 0.05 MG/ML IJ SOLN
INTRAMUSCULAR | Status: DC
Start: 2014-07-14 — End: 2014-07-14
  Filled 2014-07-14: qty 2

## 2014-07-14 MED ORDER — LIDOCAINE HCL (CARDIAC) 20 MG/ML IV SOLN
INTRAVENOUS | Status: DC | PRN
  Administered 2014-07-14: 40 mg via INTRAVENOUS

## 2014-07-14 MED ORDER — GLYCOPYRROLATE 0.2 MG/ML IJ SOLN
INTRAMUSCULAR | Status: DC | PRN
  Administered 2014-07-14: 0.6 mg via INTRAVENOUS

## 2014-07-14 MED ORDER — PROPOFOL 10 MG/ML IV BOLUS
INTRAVENOUS | Status: AC
  Filled 2014-07-14: qty 20

## 2014-07-14 MED ORDER — GLYCOPYRROLATE 0.2 MG/ML IJ SOLN
INTRAMUSCULAR | Status: AC
Start: 2014-07-14 — End: 2014-07-14
  Filled 2014-07-14: qty 3

## 2014-07-14 MED ORDER — OXYCODONE HCL 5 MG PO TABS
ORAL_TABLET | ORAL | Status: AC
  Filled 2014-07-14: qty 1

## 2014-07-14 MED ORDER — CHLORHEXIDINE GLUCONATE CLOTH 2 % EX PADS: 6.0000 | MEDICATED_PAD | Freq: Once | CUTANEOUS | Status: DC

## 2014-07-14 MED ORDER — OXYCODONE-ACETAMINOPHEN 5-325 MG PO TABS
ORAL_TABLET | ORAL | Status: AC
  Administered 2014-07-14: 1
  Filled 2014-07-14: qty 1

## 2014-07-14 MED ORDER — MIDAZOLAM HCL 5 MG/5ML IJ SOLN
INTRAMUSCULAR | Status: DC | PRN
  Administered 2014-07-14: 2 mg via INTRAVENOUS

## 2014-07-14 MED ORDER — FENTANYL CITRATE 0.05 MG/ML IJ SOLN
INTRAMUSCULAR | Status: DC | PRN
  Administered 2014-07-14 (×2): 50 ug via INTRAVENOUS
  Administered 2014-07-14: 100 ug via INTRAVENOUS
  Administered 2014-07-14 (×2): 50 ug via INTRAVENOUS
  Administered 2014-07-14: 100 ug via INTRAVENOUS
  Administered 2014-07-14 (×2): 50 ug via INTRAVENOUS

## 2014-07-14 MED ORDER — LIDOCAINE-EPINEPHRINE 0.5 %-1:200000 IJ SOLN
INTRAMUSCULAR | Status: AC
  Filled 2014-07-14: qty 1

## 2014-07-14 MED ORDER — TRAMADOL HCL 50 MG PO TABS: 50.0000 mg | ORAL_TABLET | Freq: Four times a day (QID) | ORAL | Status: DC | PRN

## 2014-07-14 MED ORDER — ONDANSETRON HCL 4 MG/2ML IJ SOLN
INTRAMUSCULAR | Status: DC | PRN
  Administered 2014-07-14: 4 mg via INTRAVENOUS

## 2014-07-14 MED ORDER — ONDANSETRON HCL 4 MG/2ML IJ SOLN
INTRAMUSCULAR | Status: AC
  Filled 2014-07-14: qty 2

## 2014-07-14 MED ORDER — ARTIFICIAL TEARS OP OINT
TOPICAL_OINTMENT | OPHTHALMIC | Status: AC
  Filled 2014-07-14: qty 3.5

## 2014-07-14 MED ORDER — OXYCODONE-ACETAMINOPHEN 5-325 MG PO TABS: 1.0000 | ORAL_TABLET | Freq: Four times a day (QID) | ORAL | Status: DC | PRN

## 2014-07-14 MED ORDER — MIDAZOLAM HCL 2 MG/2ML IJ SOLN
INTRAMUSCULAR | Status: AC
  Filled 2014-07-14: qty 2

## 2014-07-14 MED ORDER — SODIUM CHLORIDE 0.9 % IV SOLN: INTRAVENOUS | Status: DC

## 2014-07-14 MED ORDER — NEOSTIGMINE METHYLSULFATE 10 MG/10ML IV SOLN
INTRAVENOUS | Status: DC | PRN
  Administered 2014-07-14: 4 mg via INTRAVENOUS

## 2014-07-14 MED ORDER — ROCURONIUM BROMIDE 100 MG/10ML IV SOLN
INTRAVENOUS | Status: DC | PRN
  Administered 2014-07-14: 10 mg via INTRAVENOUS
  Administered 2014-07-14: 40 mg via INTRAVENOUS

## 2014-07-14 MED ORDER — MUPIROCIN 2 % EX OINT
TOPICAL_OINTMENT | CUTANEOUS | Status: AC
  Administered 2014-07-14: 1 via TOPICAL
  Filled 2014-07-14: qty 22

## 2014-07-14 MED ORDER — LIDOCAINE HCL (CARDIAC) 20 MG/ML IV SOLN
INTRAVENOUS | Status: AC
  Filled 2014-07-14: qty 5

## 2014-07-14 MED ORDER — MUPIROCIN 2 % EX OINT
1.0000 "application " | TOPICAL_OINTMENT | Freq: Once | CUTANEOUS | Status: AC
  Administered 2014-07-14: 1 via TOPICAL

## 2014-07-14 SURGICAL SUPPLY — 50 items
APL SKNCLS STERI-STRIP NONHPOA (GAUZE/BANDAGES/DRESSINGS) ×1
BAG ISL DRAPE 18X18 STRL (DRAPES)
BAG ISOLATION DRAPE 18X18 (DRAPES) ×1 IMPLANT
BENZOIN TINCTURE PRP APPL 2/3 (GAUZE/BANDAGES/DRESSINGS) ×3 IMPLANT
BLADE SURG 11 STRL SS (BLADE) ×1 IMPLANT
BLADE SURG 15 STRL LF DISP TIS (BLADE) IMPLANT
BLADE SURG 15 STRL SS (BLADE)
BNDG COHESIVE 6X5 TAN STRL LF (GAUZE/BANDAGES/DRESSINGS) ×1 IMPLANT
BNDG GAUZE ELAST 4 BULKY (GAUZE/BANDAGES/DRESSINGS) ×1 IMPLANT
CANISTER SUCTION 2500CC (MISCELLANEOUS) ×3 IMPLANT
CANNULA VESSEL 3MM 2 BLNT TIP (CANNULA) IMPLANT
CLIP LIGATING EXTRA MED SLVR (CLIP) ×3 IMPLANT
CLIP LIGATING EXTRA SM BLUE (MISCELLANEOUS) ×3 IMPLANT
CLOSURE WOUND 1/2 X4 (GAUZE/BANDAGES/DRESSINGS) ×1
DRAPE CHEST BREAST 15X10 FENES (DRAPES) ×2 IMPLANT
DRAPE INCISE IOBAN 66X45 STRL (DRAPES) ×3 IMPLANT
DRAPE ISOLATION BAG 18X18 (DRAPES)
DRSG COVADERM 4X8 (GAUZE/BANDAGES/DRESSINGS) ×1 IMPLANT
ELECT REM PT RETURN 9FT ADLT (ELECTROSURGICAL) ×3
ELECTRODE REM PT RTRN 9FT ADLT (ELECTROSURGICAL) ×1 IMPLANT
GLOVE SS BIOGEL STRL SZ 7.5 (GLOVE) ×1 IMPLANT
GLOVE SUPERSENSE BIOGEL SZ 7.5 (GLOVE) ×2
GLOVE SURG SS PI 7.0 STRL IVOR (GLOVE) ×4 IMPLANT
GOWN STRL REUS W/ TWL LRG LVL3 (GOWN DISPOSABLE) ×3 IMPLANT
GOWN STRL REUS W/TWL LRG LVL3 (GOWN DISPOSABLE) ×9
KIT BASIN OR (CUSTOM PROCEDURE TRAY) ×3 IMPLANT
KIT ROOM TURNOVER OR (KITS) ×3 IMPLANT
LIQUID BAND (GAUZE/BANDAGES/DRESSINGS) ×2 IMPLANT
NS IRRIG 1000ML POUR BTL (IV SOLUTION) ×3 IMPLANT
PACK GENERAL/GYN (CUSTOM PROCEDURE TRAY) ×3 IMPLANT
PACK UNIVERSAL I (CUSTOM PROCEDURE TRAY) ×1 IMPLANT
PAD ARMBOARD 7.5X6 YLW CONV (MISCELLANEOUS) ×6 IMPLANT
SPECIMEN JAR SMALL (MISCELLANEOUS) ×1 IMPLANT
SPONGE GAUZE 4X4 12PLY STER LF (GAUZE/BANDAGES/DRESSINGS) ×2 IMPLANT
STRIP CLOSURE SKIN 1/2X4 (GAUZE/BANDAGES/DRESSINGS) ×2 IMPLANT
SUT PROLENE 0 SH 30 (SUTURE) ×2 IMPLANT
SUT SILK 2 0 (SUTURE)
SUT SILK 2 0 SH (SUTURE) IMPLANT
SUT SILK 2-0 18XBRD TIE 12 (SUTURE) ×1 IMPLANT
SUT SILK 3 0 (SUTURE) ×3
SUT SILK 3-0 18XBRD TIE 12 (SUTURE) ×1 IMPLANT
SUT VIC AB 3-0 SH 27 (SUTURE) ×6
SUT VIC AB 3-0 SH 27X BRD (SUTURE) ×1 IMPLANT
SUT VIC AB 3-0 SH 8-18 (SUTURE) IMPLANT
SUT VICRYL 4-0 PS2 18IN ABS (SUTURE) ×2 IMPLANT
SUT VICRYL AB 3 0 TIES (SUTURE) ×3 IMPLANT
TAPE CLOTH SURG 4X10 WHT LF (GAUZE/BANDAGES/DRESSINGS) ×2 IMPLANT
UNDERPAD 30X30 INCONTINENT (UNDERPADS AND DIAPERS) ×1 IMPLANT
VEIN STRIPPER DISP (MISCELLANEOUS) ×1 IMPLANT
WATER STERILE IRR 1000ML POUR (IV SOLUTION) ×3 IMPLANT

## 2014-07-14 NOTE — Anesthesia Preprocedure Evaluation (Addendum)
Anesthesia Evaluation  Patient identified by MRN, date of birth, ID band Patient awake    Reviewed: Allergy & Precautions, NPO status , Patient's Chart, lab work & pertinent test results  Airway Mallampati: II  TM Distance: >3 FB Neck ROM: Full    Dental  (+) Teeth Intact, Dental Advisory Given   Pulmonary Current Smoker,  breath sounds clear to auscultation        Cardiovascular hypertension, Rhythm:Regular Rate:Normal     Neuro/Psych  Headaches, Seizures -,  PSYCHIATRIC DISORDERS    GI/Hepatic   Endo/Other    Renal/GU      Musculoskeletal   Abdominal (+) + obese,   Peds  Hematology   Anesthesia Other Findings   Reproductive/Obstetrics                            Anesthesia Physical Anesthesia Plan  ASA: III and emergent  Anesthesia Plan: General   Post-op Pain Management:    Induction: Intravenous  Airway Management Planned: Oral ETT  Additional Equipment:   Intra-op Plan:   Post-operative Plan:   Informed Consent: I have reviewed the patients History and Physical, chart, labs and discussed the procedure including the risks, benefits and alternatives for the proposed anesthesia with the patient or authorized representative who has indicated his/her understanding and acceptance.   Dental advisory given and Dental Advisory Given  Plan Discussed with: CRNA and Anesthesiologist  Anesthesia Plan Comments: (Hypertension Obesity Bleeding umbilical varices  Plan GA with oral ETT  Kipp Broodavid Joslin)       Anesthesia Quick Evaluation

## 2014-07-14 NOTE — Interval H&P Note (Signed)
History and Physical Interval Note:  07/14/2014 12:41 PM  Julie MerlMichelle L Rowland  has presented today for surgery, with the diagnosis of Bleeding from umbilicus NEC I86.8  The various methods of treatment have been discussed with the patient and family. After consideration of risks, benefits and other options for treatment, the patient has consented to  Procedure(s): EXCISION OF VARICOSE VEINS AT UMBILICUS (N/A) as a surgical intervention .  The patient's history has been reviewed, patient examined, no change in status, stable for surgery.  I have reviewed the patient's chart and labs.  Questions were answered to the patient's satisfaction.     [name]

## 2014-07-14 NOTE — Op Note (Signed)
OPERATIVE REPORT  DATE OF SURGERY: 07/14/2014  PATIENT: Julie Rowland, 44 y.o. female MRN: [id number]  DOB: Sep 20, 1970  PRE-OPERATIVE DIAGNOSIS: Bleeding varices at the umbilicus  POST-OPERATIVE DIAGNOSIS:  Same and umbilical hernia  PROCEDURE: Resection of varicosities at the umbilicus and repair of umbilical hernia  SURGEON:  Gretta Began, M.D.  PHYSICIAN ASSISTANT: Trinh  ANESTHESIA:  Gen.  EBL: Minimal ml  Total I/O In: 1000 [I.V.:1000] Out: 50 [Blood:50]  BLOOD ADMINISTERED: None  DRAINS: None  SPECIMEN: Hernia sac, venous varices, omentum with varices  COUNTS CORRECT:  YES  PLAN OF CARE: PACU stable   PATIENT DISPOSITION:  PACU - hemodynamically stable  PROCEDURE DETAILS: The patient was taken to the operating room placed supine position where the area the abdomen prepped in sterile fashion. Patient had a very unusual spot presentation with varices in the umbilicus which had eroded into the skin and had significant bleeding. An ellipse of skin was made transversely around these eroded varices. This was carried down through the subcutaneous tissue and this ellipse of skin and the underlying varices were resected. The varices were ligated with 2-0 Vicryl ties. After this was accomplished was clear that there was a moderate-sized umbilical hernia with incarcerated fat present. There were prominent veins across this which were bleeding as well and were initially controlled with Vicryl sutures. This sac and the contents were does dissected down and there was a very small defect in the fascia. Felt to be unsafe to leave this behind due to the very small opening into the fascia. There did not appear to be bowel in this. The fascial edges were grasped with hemostats and the sac was opened. There was omentum present in the hernia sac. This was mobilized from the underside of the hernia sac and the hernia sac was resected and sent to pathology. The omentum that was present  had very prominent veins with varices in the omentum. Of these were controlled by ligating the with the and and with 2-0 Vicryl. A segment of omentum was resected and also sent for pathology. There was no bleeding from this area and this was allowed to retract back into the intraperitoneal space. The small defect in the fascia was closed with a single 0 Prolene figure-of-eight suture. Irrigated with saline. Hemostasis tablet cautery. The wound was closed with 3-0 Vicryl in the subcutaneous tissue to obliterate the space and the skin was closed with 3-0 Vicryl subcuticular suture. Dermabond and sterile dressing were applied. The patient was transferred to the recovery room in stable condition   Gretta Began, M.D. 07/14/2014 3:11 PM

## 2014-07-14 NOTE — Anesthesia Postprocedure Evaluation (Signed)
  Anesthesia Post-op Note  Patient: Julie Rowland  Procedure(s) Performed: Procedure(s): EXCISION OF VARICOSE VEINS AT UMBILICUS (N/A) HERNIA REPAIR UMBILICAL ADULT (N/A)  Patient Location: PACU  Anesthesia Type:General  Level of Consciousness: awake, alert  and oriented  Airway and Oxygen Therapy: Patient Spontanous Breathing and Patient connected to nasal cannula oxygen  Post-op Pain: mild  Post-op Assessment: Post-op Vital signs reviewed, Patient's Cardiovascular Status Stable, Respiratory Function Stable, Patent Airway, No signs of Nausea or vomiting and Pain level controlled  Post-op Vital Signs: stable  Last Vitals:  Filed Vitals:   07/14/14 1645  BP:   Pulse: 84  Temp:   Resp:     Complications: No apparent anesthesia complications

## 2014-07-14 NOTE — H&P (View-Only) (Signed)
Here today for follow-up from ER visit on 07/02/2014. Very unusual situation with persistent bleeding from her umbilicus. Workup including a CT scan showed plexus of varicosities around her umbilicus. She did have some irregularity in her liver with small gastric splenic and retroperitoneal varices she does have some prominent surface veins from her femoral vein extending to the surface of her abdomen on the CT scan as well. She had the nylon sutures placed in the emergency department that controlled the bleeding. There was a very proximal superficial plexus of varicosities in her umbilicus. Since she has had bleeding off and on since this procedure soaking cotton gauze pad only. No major bleeding.  Past Medical History  Diagnosis Date  . Migraine headache   . Hypertension   . Anxiety   . Seizures 2014 and 2009    History  Substance Use Topics  . Smoking status: Current Every Day Smoker -- 20 years    Types: Cigarettes  . Smokeless tobacco: Never Used     Comment: cut back to 4-5 cigarettes per day  . Alcohol Use: 0.0 oz/week    0 Standard drinks or equivalent per week     Comment: occasionally    Family History  Problem Relation Age of Onset  . Hypertension Mother   . Cancer Mother   . Diabetes Mother     No Known Allergies   Current outpatient prescriptions:  .  cyclobenzaprine (FLEXERIL) 10 MG tablet, Take 1 tablet (10 mg total) by mouth 2 (two) times daily as needed for muscle spasms (caution will cause drowsiness). For headache per patient, Disp: 45 tablet, Rfl: 1 .  hydrochlorothiazide (HYDRODIURIL) 25 MG tablet, Take 1 tablet (25 mg total) by mouth daily., Disp: 90 tablet, Rfl: 3 .  traMADol (ULTRAM) 50 MG tablet, Take 1 tablet (50 mg total) by mouth every 6 (six) hours as needed for moderate pain or severe pain., Disp: 15 tablet, Rfl: 0 .  zolpidem (AMBIEN) 10 MG tablet, Take 10 mg by mouth at bedtime as needed for sleep., Disp: , Rfl:  .  dicyclomine (BENTYL) 20 MG  tablet, Take 1 tablet (20 mg total) by mouth 2 (two) times daily. (Patient not taking: Reported on 06/30/2014), Disp: 20 tablet, Rfl: 0 .  famotidine (PEPCID) 20 MG tablet, Take 1 tablet (20 mg total) by mouth 2 (two) times daily. (Patient not taking: Reported on 06/22/2014), Disp: 30 tablet, Rfl: 0 .  furosemide (LASIX) 40 MG tablet, Take 1 tablet (40 mg total) by mouth daily. (Patient not taking: Reported on 06/30/2014), Disp: 10 tablet, Rfl: 0 .  potassium chloride (K-DUR) 10 MEQ tablet, Take 1 tablet (10 mEq total) by mouth daily. (Patient not taking: Reported on 07/02/2014), Disp: 14 tablet, Rfl: 0 .  sucralfate (CARAFATE) 1 G tablet, Take 1 tablet (1 g total) by mouth 4 (four) times daily -  with meals and at bedtime. (Patient not taking: Reported on 06/30/2014), Disp: 30 tablet, Rfl: 0 .  zinc gluconate 50 MG tablet, Take 50 mg by mouth daily as needed (cold). , Disp: , Rfl:   Current facility-administered medications:  .  levonorgestrel (MIRENA) 20 MCG/24HR IUD, , Intrauterine, Once, Dessa PhiJosalyn Funches, MD  Filed Vitals:   07/13/14 0844 07/13/14 0848  BP: 162/91 154/88  Pulse: 99 96  Resp: 16   Height: 5\' 9"  (1.753 m)   Weight: 249 lb (112.946 kg)     Body mass index is 36.75 kg/(m^2).       At her physical exam  is unchanged. She is alert and oriented no acute distress. Respirations are equal in nonlabored. Umbilicus is nontender. She does have sutures in place which were removed. She does have a persistent prominent plexus with very superficial "nipple" that is oozing with suture removal.  Impression and plan: I did discuss with patient very unusual situation. This is related to increased venous pressure for what ever calls. I explained my role would be in the local treatment of this. I feel that she is going to continue to have trouble with bleeding and have recommended this plexus of veins in the operating room under better controlled circumstances. She understands we will proceed with  this tomorrow as an outpatient

## 2014-07-14 NOTE — Anesthesia Procedure Notes (Signed)
Procedure Name: Intubation Date/Time: 07/14/2014 1:39 PM Performed by: Fransisca KaufmannMEYER, Mariadelosang Wynns E Pre-anesthesia Checklist: Patient identified, Emergency Drugs available, Suction available, Patient being monitored and Timeout performed Patient Re-evaluated:Patient Re-evaluated prior to inductionOxygen Delivery Method: Circle system utilized Preoxygenation: Pre-oxygenation with 100% oxygen Intubation Type: IV induction Ventilation: Mask ventilation without difficulty Laryngoscope Size: Miller and 2 Grade View: Grade I Tube type: Oral Tube size: 7.5 mm Number of attempts: 1 Placement Confirmation: ETT inserted through vocal cords under direct vision,  positive ETCO2 and breath sounds checked- equal and bilateral Secured at: 22 cm Tube secured with: Tape Dental Injury: Teeth and Oropharynx as per pre-operative assessment  Comments: Note: pt has lesion left lower lip/dry/bleeds intermittantly

## 2014-07-14 NOTE — Transfer of Care (Signed)
Immediate Anesthesia Transfer of Care Note  Patient: Julie Rowland  Procedure(s) Performed: Procedure(s): EXCISION OF VARICOSE VEINS AT UMBILICUS (N/A) HERNIA REPAIR UMBILICAL ADULT (N/A)  Patient Location: PACU  Anesthesia Type:General  Level of Consciousness: awake, alert , oriented and sedated  Airway & Oxygen Therapy: Patient Spontanous Breathing and Patient connected to face mask oxygen  Post-op Assessment: Report given to RN, Post -op Vital signs reviewed and stable and Patient moving all extremities  Post vital signs: Reviewed and stable  Last Vitals:  Filed Vitals:   07/14/14 1515  BP: 135/77  Pulse: 102  Temp:   Resp: 15    Complications: No apparent anesthesia complications

## 2014-07-15 ENCOUNTER — Encounter (HOSPITAL_COMMUNITY): Payer: Self-pay | Admitting: Vascular Surgery

## 2014-07-15 ENCOUNTER — Telehealth: Payer: Self-pay | Admitting: Vascular Surgery

## 2014-07-15 NOTE — Telephone Encounter (Addendum)
-----   Message from Sharee PimpleMarilyn K McChesney, RN sent at 07/14/2014  5:05 PM EDT ----- Regarding: Schedule   ----- Message -----    From: Raymond GurneyKimberly A Trinh, PA-C    Sent: 07/14/2014   2:57 PM      To: [facility] Charge Pool  S/p excision of varicosities at umbilicus and repair of umbilical hernia 07/14/14  F/u with Dr. Arbie CookeyEarly in 2 weeks.  Thanks Kim  notified patient of post op appointment on 07-27-14 at 11:45 am

## 2014-07-19 ENCOUNTER — Ambulatory Visit: Payer: No Typology Code available for payment source | Attending: Family Medicine | Admitting: Family Medicine

## 2014-07-19 ENCOUNTER — Encounter: Payer: Self-pay | Admitting: *Deleted

## 2014-07-19 ENCOUNTER — Encounter: Payer: Self-pay | Admitting: Family Medicine

## 2014-07-19 VITALS — BP 125/76 | HR 82 | Temp 98.1°F | Resp 16 | Ht 69.0 in | Wt 244.0 lb

## 2014-07-19 DIAGNOSIS — Z8659 Personal history of other mental and behavioral disorders: Secondary | ICD-10-CM | POA: Insufficient documentation

## 2014-07-19 DIAGNOSIS — E669 Obesity, unspecified: Secondary | ICD-10-CM | POA: Insufficient documentation

## 2014-07-19 DIAGNOSIS — F17200 Nicotine dependence, unspecified, uncomplicated: Secondary | ICD-10-CM | POA: Insufficient documentation

## 2014-07-19 DIAGNOSIS — I868 Varicose veins of other specified sites: Secondary | ICD-10-CM | POA: Insufficient documentation

## 2014-07-19 DIAGNOSIS — K3189 Other diseases of stomach and duodenum: Secondary | ICD-10-CM | POA: Insufficient documentation

## 2014-07-19 DIAGNOSIS — Z114 Encounter for screening for human immunodeficiency virus [HIV]: Secondary | ICD-10-CM | POA: Insufficient documentation

## 2014-07-19 DIAGNOSIS — T402X5S Adverse effect of other opioids, sequela: Secondary | ICD-10-CM | POA: Insufficient documentation

## 2014-07-19 DIAGNOSIS — R1030 Lower abdominal pain, unspecified: Secondary | ICD-10-CM | POA: Insufficient documentation

## 2014-07-19 DIAGNOSIS — K766 Portal hypertension: Secondary | ICD-10-CM | POA: Insufficient documentation

## 2014-07-19 DIAGNOSIS — K5909 Other constipation: Secondary | ICD-10-CM | POA: Insufficient documentation

## 2014-07-19 DIAGNOSIS — Z6836 Body mass index (BMI) 36.0-36.9, adult: Secondary | ICD-10-CM | POA: Insufficient documentation

## 2014-07-19 LAB — COMPLETE METABOLIC PANEL WITH GFR
ALBUMIN: 3.4 g/dL — AB (ref 3.5–5.2)
ALT: 46 U/L — AB (ref 0–35)
AST: 87 U/L — AB (ref 0–37)
Alkaline Phosphatase: 97 U/L (ref 39–117)
BILIRUBIN TOTAL: 1.6 mg/dL — AB (ref 0.2–1.2)
BUN: 8 mg/dL (ref 6–23)
CALCIUM: 9.1 mg/dL (ref 8.4–10.5)
CO2: 26 mEq/L (ref 19–32)
Chloride: 99 mEq/L (ref 96–112)
Creat: 0.75 mg/dL (ref 0.50–1.10)
GFR, Est Non African American: >89 mL/min
Glucose, Bld: 89 mg/dL (ref 70–99)
Potassium: 4.1 mEq/L (ref 3.5–5.3)
SODIUM: 137 meq/L (ref 135–145)
Total Protein: 7.3 g/dL (ref 6.0–8.3)

## 2014-07-19 MED ORDER — FUROSEMIDE 20 MG PO TABS: 20.0000 mg | ORAL_TABLET | Freq: Every day | ORAL | Status: DC

## 2014-07-19 NOTE — Progress Notes (Signed)
   Subjective:    Patient ID: Julie Rowland, female    DOB: 05/29/1970, 44 y.o.   MRN: [id number] CC: varicose umbilical veins, elevated liver enzymes  HPI  1. Varicose umbilical veins with bleeding: doing well, 5 days s/p excision, no bleeding or oozing. Slight lower abdominal pain. Compliant with lifting restrictions. Mild constipation on percocet.  2. ? Cirrhosis: evidence of cirrhosis on CT scan. Patient with elevated transaminases first noted n 03/2014. She reports drinking 2 beers nightly for many years, sometimes more. She has quit ETOH since complication with bleeding umbilical vein. Viral hepatitis panel is normal. She did have some LE edema, tried lasix but she had too much urinary frequency. No LE edema today. She does have enlarged abdomen/bloating.   Soc Hx: current smoker  Review of Systems  Respiratory: Negative.   Cardiovascular: Negative.   Gastrointestinal: Positive for abdominal pain, constipation and abdominal distention. Negative for nausea, vomiting, diarrhea, blood in stool, anal bleeding and rectal pain.       Objective:   Physical Exam BP 125/76 mmHg  Pulse 82  Temp(Src) 98.1 F (36.7 C) (Oral)  Resp 16  Ht 5\' 9"  (1.753 m)  Wt 244 lb (110.678 kg)  BMI 36.02 kg/m2  SpO2 96% General appearance: alert, cooperative, no distress and moderately obese Lungs: clear to auscultation bilaterally Heart: regular rate and rhythm, S1, S2 normal, no murmur, click, rub or gallop Abdomen: obese, round, non tender, lower midline dressing in place  Extremities: extremities normal, atraumatic, no cyanosis or edema       Assessment & Plan:

## 2014-07-19 NOTE — Patient Instructions (Addendum)
Julie Rowland,  Thank you for coming in today.  1. Portal hypertension is usually a sign of cirrhosis: I your case regular alcohol intake is the most likely culprit.   Continue not to drink alcohol,  Lasix 20 mg daily  Referral to GI, hepatologist CMP today.   F/u in 4 weeks for check on fluid status/liver,  you are due for pap smear at f/u   Dr. Armen PickupFunches   Cirrhosis Cirrhosis is a condition of scarring of the liver which is caused when the liver has tried repairing itself following damage. This damage may come from a previous infection such as one of the forms of hepatitis (usually hepatitis C), or the damage may come from being injured by toxins. The main toxin that causes this damage is alcohol. The scarring of the liver from use of alcohol is irreversible. That means the liver cannot return to normal even though alcohol is not used any more. The main danger of hepatitis C infection is that it may cause long-lasting (chronic) liver disease, and this also may lead to cirrhosis. This complication is progressive and irreversible. CAUSES  Prior to available blood tests, hepatitis C could be contracted by blood transfusions. Since testing of blood has improved, this is now unlikely. This infection can also be contracted through intravenous drug use and the sharing of needles. It can also be contracted through sexual relationships. The injury caused by alcohol comes from too much use. It is not a few drinks that poison the liver, but years of misuse. Usually there will be some signs and symptoms early with scarring of the liver that suggest the development of better habits. Alcohol should never be used while using acetaminophen. A small dose of both taken together may cause irreversible damage to the liver. HOME CARE INSTRUCTIONS  There is no specific treatment for cirrhosis. However, there are things you can do to avoid making the condition worse.  Rest as needed.  Eat a well-balanced diet.  Your caregiver can help you with suggestions.  Vitamin supplements including vitamins A, K, D, and thiamine can help.  A low-salt diet, water restriction, or diuretic medicine may be needed to reduce fluid retention.  Avoid alcohol. This can be extremely toxic if combined with acetaminophen.  Avoid drugs which are toxic to the liver. Some of these include isoniazid, methyldopa, acetaminophen, anabolic steroids (muscle-building drugs), erythromycin, and oral contraceptives (birth control pills). Check with your caregiver to make sure medicines you are presently taking will not be harmful.  Periodic blood tests may be required. Follow your caregiver's advice regarding the timing of these.  Milk thistle is an herbal remedy which does protect the liver against toxins. However, it will not help once the liver has been scarred. SEEK MEDICAL CARE IF:  You have increasing fatigue or weakness.  You develop swelling of the hands, feet, legs, or face.  You vomit bright red blood, or a coffee ground appearing material.  You have blood in your stools, or the stools turn black and tarry.  You have a fever.  You develop loss of appetite, or have nausea and vomiting.  You develop jaundice.  You develop easy bruising or bleeding.  You have worsening of any of the problems you are concerned about. Document Released: 04/02/2005 Document Revised: 06/25/2011 Document Reviewed: 11/19/2007 [facility] Patient Information 2015 [place]. This information is not intended to replace advice given to you by your health care provider. Make sure you discuss any questions you have with your  health care provider.  

## 2014-07-19 NOTE — Progress Notes (Signed)
F/U post surgery. Surgery area no infection no swelling

## 2014-07-19 NOTE — Assessment & Plan Note (Signed)
Screening HIV today  

## 2014-07-19 NOTE — Assessment & Plan Note (Signed)
Portal hypertension is usually a sign of cirrhosis: in your case regular alcohol intake is the most likely culprit.   Continue not to drink alcohol,  Lasix 20 mg daily  Referral to GI, hepatologist CMP today.

## 2014-07-20 LAB — HIV ANTIBODY (ROUTINE TESTING W REFLEX): HIV 1&2 Ab, 4th Generation: NONREACTIVE

## 2014-07-21 ENCOUNTER — Encounter: Payer: Self-pay | Admitting: Gastroenterology

## 2014-07-26 ENCOUNTER — Encounter: Payer: Self-pay | Admitting: Vascular Surgery

## 2014-07-27 ENCOUNTER — Ambulatory Visit (INDEPENDENT_AMBULATORY_CARE_PROVIDER_SITE_OTHER): Payer: Self-pay | Admitting: Vascular Surgery

## 2014-07-27 ENCOUNTER — Encounter: Payer: Self-pay | Admitting: Vascular Surgery

## 2014-07-27 DIAGNOSIS — K429 Umbilical hernia without obstruction or gangrene: Secondary | ICD-10-CM

## 2014-07-27 NOTE — Progress Notes (Signed)
Here today for follow-up of resection of the varicosities and repair of umbilical hernia around her umbilicus on 07/14/2014. An unusual presentation with recurrent bleeding from varices at the level of her umbilicus. At the time of surgery had resection of these and also was found to have an umbilical hernia that was treated with excision and closure of her fascia.  She looks quite good today. She reports that she is having minimal discomfort associated with this. She is ready to return to work. Her incision is well-healed and her umbilicus. No sutures are present these are all subcuticular closure.  Pathologic results showed the benign tissue.  Patient reports she is seeing her primary care doctor for discussion of potential portal hypertension and this will be followed by primary care. We'll see the patient again on as-needed basis.

## 2014-07-29 ENCOUNTER — Telehealth: Payer: Self-pay | Admitting: *Deleted

## 2014-07-29 NOTE — Telephone Encounter (Signed)
Left voice message to return call 

## 2014-07-29 NOTE — Telephone Encounter (Signed)
-----   Message from Dessa PhiJosalyn Funches, MD sent at 07/20/2014  9:14 AM EDT ----- Persistently mildly elevated AST and ALT with AST roughly 2x ALT Screening HIV negative  Continue to avoid ETOH,

## 2014-08-04 NOTE — Telephone Encounter (Signed)
Pt aware of results 

## 2014-08-10 ENCOUNTER — Telehealth: Payer: Self-pay

## 2014-08-10 ENCOUNTER — Ambulatory Visit: Payer: Self-pay | Admitting: Nurse Practitioner

## 2014-08-10 NOTE — Telephone Encounter (Signed)
Phone call from pt.  Reported she noticed small amount of bloody drainage from the umbilicus when she woke up this morning; stated the quantity would not have been enough to fill a cotton ball.  Reported she has also noticed a small amount of "clear, watery" drainage, at intervals.  Denied any increased redness/ inflammation, or fever/ chills.  Stated there is some tenderness, but it hasn't increased.  Denied any opening in the umbilical incision.  Advised to clean incision with dial soap,  keep umbilical area clean/ dry, to continue to monitor for signs of infection, and to call with any concerns.  Verb. understanding.

## 2014-08-11 ENCOUNTER — Telehealth: Payer: Self-pay

## 2014-08-11 NOTE — Telephone Encounter (Signed)
Phone call from pt.  Reported small amt. of active oozing bloody drainage from umbilical area.  Reported this 4/26, and called back today, voicing increased concern.  Stated "I don't want to bleed out."  Requested to have the incision checked.  Appt. given for 08/12/14 @ 2:00 PM.  Pt. agreed with plan.   Discussed with Dr. Arbie CookeyEarly; stated she would not need any vascular lab at this time.

## 2014-08-12 ENCOUNTER — Ambulatory Visit (INDEPENDENT_AMBULATORY_CARE_PROVIDER_SITE_OTHER): Payer: Self-pay | Admitting: Vascular Surgery

## 2014-08-12 ENCOUNTER — Encounter: Payer: Self-pay | Admitting: Vascular Surgery

## 2014-08-12 VITALS — BP 134/74 | HR 100 | Temp 98.2°F | Ht 69.0 in | Wt 248.0 lb

## 2014-08-12 DIAGNOSIS — G8918 Other acute postprocedural pain: Secondary | ICD-10-CM

## 2014-08-12 MED ORDER — OXYCODONE-ACETAMINOPHEN 5-325 MG PO TABS: 1.0000 | ORAL_TABLET | Freq: Four times a day (QID) | ORAL | Status: DC | PRN

## 2014-08-12 NOTE — Progress Notes (Signed)
    Postoperative Visit   History of Present Illness  Julie Rowland is a 44 y.o. year old female who presents as an add-on for umbilical drainage s/p resection of varicosities at the umbilicus and repair of umbilical hernia by Dr. Arbie CookeyEarly on 07/14/14. The patient's wounds are healed. 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 She denies any fever or chills.   Physical Examination Filed Vitals:   08/12/14 1408  BP: 134/74  Pulse: 100  Temp: 98.2 F (36.8 C)   Umbilical incision healed. No active drainage or purulence. No evidence of hematoma.   Medical Decision Making  Julie Rowland is a 44 y.o. year old female who is s/p  resection of varicosities at the umbilicus and repair of umbilical hernia.  The incision site is well healed with no evidence of hematoma or drainage. The patient's drainage was likely due to irritation caused by her clothing. Advised patient to apply a small dressing to her incision to prevent any further irritation. The patient asked for more pain medication and was prescribed 5 tablets of percocet. No further narcotic pain medication prescriptions should be necessary. She was also given a work absence excuse note. She will follow up on an as needed basis.   Maris BergerKimberly Mechel Haggard, PA-C [facility] Office: [phone]  08/12/2014, 2:24 PM  I agree with above. Wound is clean and healing. No evidence of drainage today. No evidence of abscess. There is no evidence of infection. Patient will follow-up on as-needed basis.  Fabienne Brunsharles Fields, MD [facility] Office: [phone] Pager: [phone]

## 2014-08-16 ENCOUNTER — Ambulatory Visit: Payer: Self-pay

## 2014-08-23 ENCOUNTER — Ambulatory Visit: Payer: Self-pay | Attending: Family Medicine | Admitting: Family Medicine

## 2014-08-23 ENCOUNTER — Encounter: Payer: Self-pay | Admitting: Family Medicine

## 2014-08-23 ENCOUNTER — Telehealth: Payer: Self-pay | Admitting: *Deleted

## 2014-08-23 VITALS — BP 126/73 | HR 95 | Temp 98.1°F | Resp 18 | Ht 69.0 in | Wt 258.0 lb

## 2014-08-23 DIAGNOSIS — F1011 Alcohol abuse, in remission: Secondary | ICD-10-CM | POA: Insufficient documentation

## 2014-08-23 DIAGNOSIS — K766 Portal hypertension: Secondary | ICD-10-CM

## 2014-08-23 DIAGNOSIS — F1010 Alcohol abuse, uncomplicated: Secondary | ICD-10-CM

## 2014-08-23 DIAGNOSIS — R600 Localized edema: Secondary | ICD-10-CM

## 2014-08-23 MED ORDER — SPIRONOLACTONE 25 MG PO TABS: 25.0000 mg | ORAL_TABLET | Freq: Every day | ORAL | Status: DC

## 2014-08-23 MED ORDER — TRAMADOL HCL 50 MG PO TABS: 50.0000 mg | ORAL_TABLET | Freq: Three times a day (TID) | ORAL | Status: DC | PRN

## 2014-08-23 MED ORDER — ACETAMINOPHEN-CODEINE #3 300-30 MG PO TABS: 1.0000 | ORAL_TABLET | Freq: Every evening | ORAL | Status: DC | PRN

## 2014-08-23 MED ORDER — MEDICAL COMPRESSION STOCKINGS MISC: 1.0000 | Freq: Every day | Status: DC

## 2014-08-23 NOTE — Progress Notes (Signed)
F/U Portal blood pressure Complaining of ankle swelling and pain x1 week

## 2014-08-23 NOTE — Assessment & Plan Note (Addendum)
Portal hypertension: Leg swelling Start aldactone 25 mg daily  Compression stockings ordered  Pain control: Tramadol during the day Tylenol #3 at night.   F/u in 2 weeks for BMP, potassium check F/u with me in 6 weeks

## 2014-08-23 NOTE — Progress Notes (Signed)
   Subjective:    Patient ID: Julie Rowland, female    DOB: 12/31/1970, 44 y.o.   MRN: [id number] CC: f/u portal HTN, leg swelling  HPI 1. Portal HTN: related to alcoholism. Patient is no longer drinking. She is intolerant of lasix due to urinary frequency. She has swelling in both feet, ankles, legs and abdomen. R>L leg. She has pain due to swelling that is worse at night. She is taking tramadol and ambien for pain at night.   2. H/o ETOH abuse: no longer drinking.   Soc Hx: quit smoking, 3 weeks ago   Review of Systems  Constitutional: Negative for fever and chills.  Cardiovascular: Positive for leg swelling. Negative for chest pain and palpitations.  Gastrointestinal: Positive for abdominal distention. Negative for nausea and vomiting.       Objective:   Physical Exam BP 126/73 mmHg  Pulse 95  Temp(Src) 98.1 F (36.7 C) (Oral)  Resp 18  Ht 5\' 9"  (1.753 m)  Wt 258 lb (117.028 kg)  BMI 38.08 kg/m2  SpO2 96%  BP Readings from Last 3 Encounters:  08/23/14 126/73  08/12/14 134/74  07/27/14 140/85   Wt Readings from Last 3 Encounters:  08/23/14 258 lb (117.028 kg)  08/12/14 248 lb (112.492 kg)  07/27/14 244 lb (110.678 kg)  General appearance: alert, cooperative, no distress and mild distress Lungs: clear to auscultation bilaterally Heart: regular rate and rhythm, S1, S2 normal, no murmur, click, rub or gallop Abdomen: soft, non-tender; bowel sounds normal; no masses,  no organomegaly Extremities: edema 1+ edema      Assessment & Plan:

## 2014-08-23 NOTE — Assessment & Plan Note (Signed)
Former heavy drinker In remission

## 2014-08-23 NOTE — Patient Instructions (Signed)
[name],  Thank you for coming in today:  1. Portal hypertension: Leg swelling Start aldactone 25 mg daily  Pain control: Tramadol during the day Tylenol #3 at night.   F/u in 2 weeks for BMP, potassium check F/u with me in 6 weeks   Dr. Armen PickupFunches

## 2014-08-23 NOTE — Telephone Encounter (Signed)
Patient left message concerned that she did not receive Rx for lasix at today's OV [facility] to [facility] meds  Verified with PCP that patient is to be on spironolactone 25 mg daily and not lasix Unable to reach patient at home number [facility] notified and will relay information to patient when she picks up meds

## 2014-08-24 ENCOUNTER — Telehealth: Payer: Self-pay | Admitting: Nurse Practitioner

## 2014-08-24 ENCOUNTER — Ambulatory Visit: Payer: Self-pay | Admitting: Nurse Practitioner

## 2014-08-24 ENCOUNTER — Encounter: Payer: Self-pay | Admitting: Nurse Practitioner

## 2014-08-24 NOTE — Telephone Encounter (Signed)
PATIENT WAS A NO SHOW 08/24/14 AND LETTER WAS SENT °

## 2014-08-27 NOTE — Telephone Encounter (Signed)
Noted  

## 2014-09-17 ENCOUNTER — Ambulatory Visit: Payer: Self-pay | Attending: Family Medicine

## 2014-10-04 ENCOUNTER — Encounter: Payer: Self-pay | Admitting: Family Medicine

## 2014-10-04 ENCOUNTER — Ambulatory Visit: Payer: Self-pay | Attending: Family Medicine | Admitting: Family Medicine

## 2014-10-04 VITALS — BP 129/72 | HR 96 | Temp 98.1°F | Resp 16 | Ht 69.0 in | Wt 253.0 lb

## 2014-10-04 DIAGNOSIS — F1010 Alcohol abuse, uncomplicated: Secondary | ICD-10-CM | POA: Insufficient documentation

## 2014-10-04 DIAGNOSIS — R1030 Lower abdominal pain, unspecified: Secondary | ICD-10-CM | POA: Insufficient documentation

## 2014-10-04 DIAGNOSIS — M7989 Other specified soft tissue disorders: Secondary | ICD-10-CM | POA: Insufficient documentation

## 2014-10-04 DIAGNOSIS — R180 Malignant ascites: Secondary | ICD-10-CM

## 2014-10-04 DIAGNOSIS — K760 Fatty (change of) liver, not elsewhere classified: Secondary | ICD-10-CM | POA: Insufficient documentation

## 2014-10-04 DIAGNOSIS — K766 Portal hypertension: Secondary | ICD-10-CM

## 2014-10-04 DIAGNOSIS — R188 Other ascites: Secondary | ICD-10-CM

## 2014-10-04 DIAGNOSIS — Z6837 Body mass index (BMI) 37.0-37.9, adult: Secondary | ICD-10-CM | POA: Insufficient documentation

## 2014-10-04 DIAGNOSIS — Z30432 Encounter for removal of intrauterine contraceptive device: Secondary | ICD-10-CM

## 2014-10-04 DIAGNOSIS — Z87891 Personal history of nicotine dependence: Secondary | ICD-10-CM | POA: Insufficient documentation

## 2014-10-04 DIAGNOSIS — I10 Essential (primary) hypertension: Secondary | ICD-10-CM

## 2014-10-04 LAB — CBC
HCT: 35.8 % — ABNORMAL LOW (ref 36.0–46.0)
Hemoglobin: 12.2 g/dL (ref 12.0–15.0)
MCH: 33.7 pg (ref 26.0–34.0)
MCHC: 34.1 g/dL (ref 30.0–36.0)
MCV: 98.9 fL (ref 78.0–100.0)
MPV: 9 fL (ref 8.6–12.4)
PLATELETS: 131 10*3/uL — AB (ref 150–400)
RBC: 3.62 MIL/uL — ABNORMAL LOW (ref 3.87–5.11)
RDW: 15.2 % (ref 11.5–15.5)
WBC: 7.8 10*3/uL (ref 4.0–10.5)

## 2014-10-04 LAB — COMPLETE METABOLIC PANEL WITH GFR
ALBUMIN: 3.2 g/dL — AB (ref 3.5–5.2)
ALT: 50 U/L — ABNORMAL HIGH (ref 0–35)
AST: 116 U/L — ABNORMAL HIGH (ref 0–37)
Alkaline Phosphatase: 130 U/L — ABNORMAL HIGH (ref 39–117)
BUN: 6 mg/dL (ref 6–23)
CO2: 23 meq/L (ref 19–32)
Calcium: 8.7 mg/dL (ref 8.4–10.5)
Chloride: 104 mEq/L (ref 96–112)
Creat: 0.64 mg/dL (ref 0.50–1.10)
GFR, Est African American: >89 mL/min
GLUCOSE: 92 mg/dL (ref 70–99)
Potassium: 4.1 mEq/L (ref 3.5–5.3)
SODIUM: 138 meq/L (ref 135–145)
Total Bilirubin: 2.8 mg/dL — ABNORMAL HIGH (ref 0.2–1.2)
Total Protein: 7.5 g/dL (ref 6.0–8.3)

## 2014-10-04 NOTE — Patient Instructions (Addendum)
Ms. Delaune,  Thank you for coming in today.  1. Ascites with portal HTN: GI referral replaced it is very important that you see GI  CMP, CBC MRI of abdomen  Letter for disability written   2. IUD: IUD removed today   F/u in 6 weeks   Dr. Armen Pickup

## 2014-10-04 NOTE — Assessment & Plan Note (Signed)
A: BP well controlled Med: compliant P: continue current regimen  

## 2014-10-04 NOTE — Progress Notes (Signed)
F/U HTN  Stated feet still swelling C/C abdominal pain  No Hx tobacco

## 2014-10-04 NOTE — Assessment & Plan Note (Signed)
A; ascites in setting of hepatic steatosis, ? Cirrhosis, previous heavy ETOH P: GI referral, stressed importance of appt with patient Continue aldactone and HCTZ Patient did not tolerate lasix  CMP today to check albumin and potassium MRI of liver ordered

## 2014-10-04 NOTE — Progress Notes (Signed)
   Subjective:    Patient ID: Julie Rowland, female    DOB: Dec 24, 1970, 44 y.o.   MRN: [id number] CC: f/u portal HTN, abdominal pain  HPI  1. Abdominal pain: b/l lower abdominal pain with distension.  Abdominal pain is heaviness in b/l lower abdomen. No bleeding or bruising. Taking aldactone and HCTZ which she is tolerating more than lasix. Has SOB, reports feeling lightheaded last week and missing work on Friday. Having swelling in legs R>L.   2. Mirena contraception: mirena place on 03/22/14. Patient subsequently developed umbilical vein varices in setting of ETOH abuse and evidence of hepatic steatosis on abdominal US/CT.   3. HTN: taking HCTZ and aldactone. No CP. Still with leg swelling and reports SOB/fatigue.   Soc Hx: former smoker, recently quit  Review of Systems  Constitutional: Positive for fatigue. Negative for fever and chills.  Respiratory: Positive for shortness of breath.   Cardiovascular: Positive for leg swelling. Negative for chest pain.  Gastrointestinal: Positive for abdominal pain and abdominal distention. Negative for nausea, vomiting, diarrhea, constipation and anal bleeding.  Hematological: Does not bruise/bleed easily.      Objective:   Physical Exam BP 129/72 mmHg  Pulse 96  Temp(Src) 98.1 F (36.7 C) (Oral)  Resp 16  Ht 5\' 9"  (1.753 m)  Wt 253 lb (114.76 kg)  BMI 37.34 kg/m2  SpO2 96%  Wt Readings from Last 3 Encounters:  10/04/14 253 lb (114.76 kg)  08/23/14 258 lb (117.028 kg)  08/12/14 248 lb (112.492 kg)  General appearance: alert, cooperative and no distress, morbidly obese  Abdomen: round, fluid wave in lower abdomen, non tender, waist circumference 52 inches  Pelvic: scant white vaginal discharge, IUD strings visualized. IUD removed.  Extremities: 1+ edema RLE, trace edema LLE      Assessment & Plan:

## 2014-10-04 NOTE — Assessment & Plan Note (Signed)
IUD removed today. 

## 2014-10-05 ENCOUNTER — Telehealth: Payer: Self-pay | Admitting: *Deleted

## 2014-10-05 NOTE — Telephone Encounter (Signed)
Pt aware of resuls Pt stated not consuming ETOH

## 2014-10-05 NOTE — Telephone Encounter (Signed)
-----   Message from Dessa Phi, MD sent at 10/05/2014  9:08 AM EDT ----- Patient with persistent elevated liver enzymes.  Pattern is that of ongoing ETOH abuse, patient must stop ETOH completely if she has not.  Hgb stable Platelets slightly low but normal.

## 2014-10-12 ENCOUNTER — Ambulatory Visit (HOSPITAL_COMMUNITY)
Admission: RE | Admit: 2014-10-12 | Discharge: 2014-10-12 | Disposition: A | Discharge status: Home / Self Care | Payer: Self-pay | Source: Ambulatory Visit | Attending: Family Medicine | Admitting: Family Medicine

## 2014-10-12 ENCOUNTER — Telehealth: Payer: Self-pay | Admitting: Family Medicine

## 2014-10-12 DIAGNOSIS — R188 Other ascites: Secondary | ICD-10-CM

## 2014-10-12 NOTE — Telephone Encounter (Signed)
Patient called stating she had mri appt today but patient was unable to have test preformed due to patient has Claustrophobia.Patient states she was told there is an open [facility]ri [facility] and patient would prefer that. Please f/u with patient

## 2014-10-13 NOTE — Telephone Encounter (Signed)
I call [facility] to schedule an appointment and they told me that Julie Rowland need to call them back  I lvm to patient to call back [phone] thank you

## 2014-10-13 NOTE — Telephone Encounter (Signed)
I called [facility] to schedule an appointment but they told that Julie Rowland need to call them

## 2014-10-19 ENCOUNTER — Ambulatory Visit: Payer: Self-pay | Attending: Family Medicine | Admitting: Family Medicine

## 2014-10-19 VITALS — BP 124/74 | HR 95 | Temp 97.8°F | Resp 18 | Ht 69.0 in | Wt 260.0 lb

## 2014-10-19 DIAGNOSIS — R1030 Lower abdominal pain, unspecified: Secondary | ICD-10-CM

## 2014-10-19 DIAGNOSIS — R160 Hepatomegaly, not elsewhere classified: Secondary | ICD-10-CM | POA: Insufficient documentation

## 2014-10-19 DIAGNOSIS — E7220 Disorder of urea cycle metabolism, unspecified: Secondary | ICD-10-CM

## 2014-10-19 DIAGNOSIS — R109 Unspecified abdominal pain: Secondary | ICD-10-CM

## 2014-10-19 LAB — POCT URINALYSIS DIPSTICK
Bilirubin, UA: NEGATIVE
Blood, UA: NEGATIVE
GLUCOSE UA: NEGATIVE
Ketones, UA: NEGATIVE
Leukocytes, UA: NEGATIVE
NITRITE UA: NEGATIVE
Protein, UA: NEGATIVE
Spec Grav, UA: 1.01
UROBILINOGEN UA: 1
pH, UA: 6

## 2014-10-19 LAB — AMMONIA: AMMONIA: 110 umol/L — AB (ref 16–53)

## 2014-10-19 LAB — POCT URINE PREGNANCY: PREG TEST UR: NEGATIVE

## 2014-10-19 MED ORDER — FUROSEMIDE 40 MG PO TABS: 40.0000 mg | ORAL_TABLET | Freq: Every day | ORAL | Status: DC

## 2014-10-19 NOTE — Progress Notes (Signed)
   Subjective:    Patient ID: Julie Rowland, female    DOB: 05/24/1970, 44 y.o.   MRN: [id number] CC: f/u portal HTN, abdominal pain  HPI 44 yo F presents for same day visit with abdominal pain complaint  1. Abdominal pain: b/l lower abdominal pain with distension.  Abdominal pain is heaviness in b/l lower abdomen. No bleeding or bruising. Taking aldactone and HCTZ pain acutely worsened this AM. The pain this AM was upper abdominal.  No fever, nausea or emesis . Also with worsening leg swelling. Was unable to get MRI due to claustrophobia. Has failed to schedule GI appt. She denies ETOH. Taking tylenol #3 and tramadol as needed for pain daily.   Soc Hx: former smoker, recently quit  Review of Systems  Constitutional: Positive for fatigue. Negative for fever and chills.  Respiratory: Positive for shortness of breath.   Cardiovascular: Positive for leg swelling. Negative for chest pain.  Gastrointestinal: Positive for abdominal pain and abdominal distention. Negative for nausea, vomiting, diarrhea, constipation and anal bleeding.  Hematological: Does not bruise/bleed easily.      Objective:   Physical Exam  BP 124/74 mmHg  Pulse 95  Temp(Src) 97.8 F (36.6 C) (Oral)  Resp 18  Ht 5\' 9"  (1.753 m)  Wt 260 lb (117.935 kg)  BMI 38.38 kg/m2  SpO2 99%  Wt Readings from Last 3 Encounters:  10/19/14 260 lb (117.935 kg)  10/12/14 253 lb (114.76 kg)  10/04/14 253 lb (114.76 kg)  General appearance: alert, cooperative and no distress, morbidly obese  Abdomen: round, tense, mild b/l LQ tenderness, no upper abdominal tenderness  Extremities: 1+ edema RLE, trace edema LLE      Assessment & Plan:

## 2014-10-19 NOTE — Patient Instructions (Addendum)
[name],  Thank you for coming in today   1. leg swelling and abdominal pain: All related to liver disease, your liver is enlarged there appears to be fluid but it is difficult to tell now   STOP HCTZ Restart lasix 40 mg daily Aldactone 25 mg daily   Open MRI ordered Please schedule appt with GI   You will be called with lab results  F/u in 4 weeks   Dr. Armen PickupFunches

## 2014-10-19 NOTE — Progress Notes (Signed)
Patient here for same day appointment for abdominal pain and swelling in both feet/ankles.  Patient reports waking up this morning and her stomach "killing" her.  Patient states she took pain medication and it is now 8/10.  Patient reports she was told to come see Dr. Armen PickupFunches if she had a combination of stomach pain and swelling in feet.   Patient states she was unable to have MRI of abdomen/liver scheduled for last week due to claustrophobia.  Patient requests referral for open MRI.

## 2014-10-19 NOTE — Assessment & Plan Note (Signed)
Leg swelling and abdominal pain: All related to liver disease, your liver is enlarged there appears to be fluid but it is difficult to tell now   STOP HCTZ Restart lasix 40 mg daily Aldactone 25 mg daily   Open MRI ordered Please schedule appt with GI

## 2014-10-20 ENCOUNTER — Telehealth: Payer: Self-pay | Admitting: *Deleted

## 2014-10-20 DIAGNOSIS — E7220 Disorder of urea cycle metabolism, unspecified: Secondary | ICD-10-CM | POA: Insufficient documentation

## 2014-10-20 LAB — CBC WITH DIFFERENTIAL/PLATELET
BASOS PCT: 0 % (ref 0–1)
Basophils Absolute: 0 10*3/uL (ref 0.0–0.1)
EOS ABS: 0.3 10*3/uL (ref 0.0–0.7)
EOS PCT: 3 % (ref 0–5)
HEMATOCRIT: 34.8 % — AB (ref 36.0–46.0)
Hemoglobin: 11.7 g/dL — ABNORMAL LOW (ref 12.0–15.0)
Lymphocytes Relative: 26 % (ref 12–46)
Lymphs Abs: 2.4 10*3/uL (ref 0.7–4.0)
MCH: 34.5 pg — AB (ref 26.0–34.0)
MCHC: 33.6 g/dL (ref 30.0–36.0)
MCV: 102.7 fL — ABNORMAL HIGH (ref 78.0–100.0)
MPV: 9.8 fL (ref 8.6–12.4)
Monocytes Absolute: 1.1 10*3/uL — ABNORMAL HIGH (ref 0.1–1.0)
Monocytes Relative: 12 % (ref 3–12)
Neutro Abs: 5.4 10*3/uL (ref 1.7–7.7)
Neutrophils Relative %: 59 % (ref 43–77)
Platelets: 126 10*3/uL — ABNORMAL LOW (ref 150–400)
RBC: 3.39 MIL/uL — ABNORMAL LOW (ref 3.87–5.11)
RDW: 15.3 % (ref 11.5–15.5)
WBC: 9.2 10*3/uL (ref 4.0–10.5)

## 2014-10-20 LAB — COMPLETE METABOLIC PANEL WITH GFR
ALT: 46 U/L — ABNORMAL HIGH (ref 0–35)
AST: 106 U/L — ABNORMAL HIGH (ref 0–37)
Albumin: 3 g/dL — ABNORMAL LOW (ref 3.5–5.2)
Alkaline Phosphatase: 142 U/L — ABNORMAL HIGH (ref 39–117)
BILIRUBIN TOTAL: 2.9 mg/dL — AB (ref 0.2–1.2)
BUN: 6 mg/dL (ref 6–23)
CHLORIDE: 101 meq/L (ref 96–112)
CO2: 26 meq/L (ref 19–32)
CREATININE: 0.65 mg/dL (ref 0.50–1.10)
Calcium: 8.4 mg/dL (ref 8.4–10.5)
GFR, Est Non African American: >89 mL/min
Glucose, Bld: 124 mg/dL — ABNORMAL HIGH (ref 70–99)
POTASSIUM: 3.9 meq/L (ref 3.5–5.3)
SODIUM: 136 meq/L (ref 135–145)
Total Protein: 7.4 g/dL (ref 6.0–8.3)

## 2014-10-20 MED ORDER — LACTULOSE 20 GM/30ML PO SOLN: 30.0000 mL | Freq: Three times a day (TID) | ORAL | Status: DC

## 2014-10-20 NOTE — Addendum Note (Signed)
Addended by: Dessa PhiFUNCHES, Lakeisha Waldrop on: 10/20/2014 09:33 AM   Modules accepted: Orders

## 2014-10-20 NOTE — Assessment & Plan Note (Signed)
Ammonia level elevated add lactulose to get rid of excess ammonia

## 2014-10-20 NOTE — Telephone Encounter (Signed)
-----   Message from Dessa PhiJosalyn Funches, MD sent at 10/20/2014  9:32 AM EDT ----- Ammonia level elevated add lactulose to get rid of excess ammonia, goal is 2-3 soft stools per day Remember to schedule GI appt   Liver enzymes are elevated but stable  CBC stale with normal WBC, slight anemia, slight low platelets

## 2014-10-20 NOTE — Telephone Encounter (Signed)
-----   Message from Dessa PhiJosalyn Funches, MD sent at 10/20/2014  9:09 AM EDT ----- Ammonia level elevated add lactulose to get rid of excess ammonia  Liver enzymes are elevated but stable  CBC stale with normal WBC, slight anemia, slight low platelets

## 2014-10-20 NOTE — Telephone Encounter (Signed)
Pt aware of results  Will pick up Rx today

## 2014-10-21 LAB — URINE CULTURE: Colony Count: 10000

## 2014-10-22 ENCOUNTER — Telehealth: Payer: Self-pay | Admitting: *Deleted

## 2014-10-22 DIAGNOSIS — R188 Other ascites: Secondary | ICD-10-CM

## 2014-10-22 DIAGNOSIS — R748 Abnormal levels of other serum enzymes: Secondary | ICD-10-CM

## 2014-10-22 NOTE — Telephone Encounter (Signed)
LVM to return call.

## 2014-10-22 NOTE — Telephone Encounter (Signed)
Patient is returning phone call, please f/u with pt. °

## 2014-10-22 NOTE — Telephone Encounter (Signed)
-----   Message from Dessa PhiJosalyn Funches, MD sent at 10/21/2014  9:14 AM EDT ----- Negative urine culture

## 2014-10-25 NOTE — Telephone Encounter (Signed)
Pt returning nurse's call and states [facility] is the only facility that has an open MRI. Please f/u with pt asap, pt is concerned about waiting too long for imaging appt.

## 2014-10-25 NOTE — Telephone Encounter (Signed)
Unable to contact pt. 

## 2014-10-27 ENCOUNTER — Telehealth: Payer: Self-pay | Admitting: *Deleted

## 2014-10-27 ENCOUNTER — Telehealth: Payer: Self-pay | Admitting: Family Medicine

## 2014-10-27 DIAGNOSIS — R748 Abnormal levels of other serum enzymes: Secondary | ICD-10-CM | POA: Insufficient documentation

## 2014-10-27 NOTE — Telephone Encounter (Signed)
US is not sensitive enough to give necessary info Patient to make GI appt, new referral placed

## 2014-10-27 NOTE — Telephone Encounter (Signed)
Pt aware of results 

## 2014-10-27 NOTE — Telephone Encounter (Signed)
-----   Message from Dessa PhiJosalyn Funches, MD sent at 10/21/2014  9:14 AM EDT ----- Negative urine culture

## 2014-10-27 NOTE — Telephone Encounter (Signed)
Patient came into office stating no open MRI is avaliable with [facility] and she is unable to have it done, Patient would like to know if she is able to have ultrasound preformed instead of MRI. Please f/u with patient

## 2014-10-27 NOTE — Telephone Encounter (Signed)
Pt aware, will schedule  appointment with GI

## 2014-10-27 NOTE — Telephone Encounter (Signed)
Patient called to have PCP be aware that she made a GI appointment and the earliest appointment they have is is 8/9. She would like her PCP to call the doctors office to get a sooner appointment  At [facility] in [place] Ph: [phone]. Please f/u with pt. °

## 2014-10-29 ENCOUNTER — Telehealth: Payer: Self-pay

## 2014-10-29 NOTE — Telephone Encounter (Signed)
Phone call from pt.  Reported increased size in abdomen with enlargement of multiple blood vessels in lower abdomen.  Described blood vessels as being the size of magic markers.  C/o increased pain in lower abdomen over past 2 weeks. Denied any bleeding / drainage from umbilicus, or dilated blood vessels.  Stated all the discomfort is below the umbilicus.  Stated she has been going to her PCP for treatment of Portal Hypertension; and now has been referred to GI.  Reported she can't see the GI Specialist until 11/23/14.  Discussed with Dr. Imogene Burnhen.  Recommended that she try to arrange an earlier appt. with GI for evaluation and treatment of symptoms.  Returned call to pt.  Advised of the above recommendation by Dr. Imogene Burnhen.  Advised to go to the ER if symptoms worsen.  Pt. verb. understanding.

## 2014-11-01 NOTE — Telephone Encounter (Signed)
Patient called to have PCP be aware that she made a GI appointment and the earliest appointment they have is is 8/9. She would like her PCP to call the doctors office to get a sooner appointment At [facility] [place] Ph: [phone]. Please f/u with pt.

## 2014-11-04 ENCOUNTER — Encounter: Payer: Self-pay | Admitting: Family Medicine

## 2014-11-04 ENCOUNTER — Ambulatory Visit (HOSPITAL_COMMUNITY)
Admission: RE | Admit: 2014-11-04 | Discharge: 2014-11-04 | Disposition: A | Discharge status: Home / Self Care | Payer: Self-pay | Source: Ambulatory Visit | Attending: Family Medicine | Admitting: Family Medicine

## 2014-11-04 ENCOUNTER — Ambulatory Visit: Payer: Self-pay | Attending: Family Medicine | Admitting: Family Medicine

## 2014-11-04 ENCOUNTER — Telehealth: Payer: Self-pay | Admitting: Family Medicine

## 2014-11-04 ENCOUNTER — Other Ambulatory Visit: Payer: Self-pay | Admitting: Family Medicine

## 2014-11-04 VITALS — BP 146/80 | HR 94 | Temp 98.5°F | Resp 16 | Ht 69.0 in | Wt 262.0 lb

## 2014-11-04 DIAGNOSIS — R160 Hepatomegaly, not elsewhere classified: Secondary | ICD-10-CM | POA: Insufficient documentation

## 2014-11-04 DIAGNOSIS — R188 Other ascites: Secondary | ICD-10-CM | POA: Insufficient documentation

## 2014-11-04 DIAGNOSIS — K769 Liver disease, unspecified: Secondary | ICD-10-CM

## 2014-11-04 DIAGNOSIS — K7581 Nonalcoholic steatohepatitis (NASH): Secondary | ICD-10-CM | POA: Insufficient documentation

## 2014-11-04 DIAGNOSIS — Z87891 Personal history of nicotine dependence: Secondary | ICD-10-CM | POA: Insufficient documentation

## 2014-11-04 DIAGNOSIS — R600 Localized edema: Secondary | ICD-10-CM

## 2014-11-04 DIAGNOSIS — R180 Malignant ascites: Secondary | ICD-10-CM

## 2014-11-04 LAB — COMPLETE METABOLIC PANEL WITH GFR
ALT: 40 U/L — AB (ref 0–35)
AST: 109 U/L — ABNORMAL HIGH (ref 0–37)
Albumin: 2.6 g/dL — ABNORMAL LOW (ref 3.5–5.2)
Alkaline Phosphatase: 123 U/L — ABNORMAL HIGH (ref 39–117)
BILIRUBIN TOTAL: 3.7 mg/dL — AB (ref 0.2–1.2)
BUN: 6 mg/dL (ref 6–23)
CHLORIDE: 103 meq/L (ref 96–112)
CO2: 22 mEq/L (ref 19–32)
CREATININE: 0.65 mg/dL (ref 0.50–1.10)
Calcium: 8.7 mg/dL (ref 8.4–10.5)
GFR, Est African American: >89 mL/min
Glucose, Bld: 81 mg/dL (ref 70–99)
POTASSIUM: 4.3 meq/L (ref 3.5–5.3)
Sodium: 139 mEq/L (ref 135–145)
TOTAL PROTEIN: 7.8 g/dL (ref 6.0–8.3)

## 2014-11-04 LAB — POCT INR: INR: 1.4

## 2014-11-04 MED ORDER — LIDOCAINE HCL (PF) 1 % IJ SOLN
INTRAMUSCULAR | Status: AC
  Filled 2014-11-04: qty 10

## 2014-11-04 MED ORDER — SPIRONOLACTONE 25 MG PO TABS: 25.0000 mg | ORAL_TABLET | Freq: Two times a day (BID) | ORAL | Status: DC

## 2014-11-04 NOTE — Telephone Encounter (Signed)
Pt states that she had ultrasound done but that she does not have enough fluid to remove.  Pt is requesting appt with GI tomorrow due to pain.

## 2014-11-04 NOTE — Progress Notes (Signed)
   Subjective:    Patient ID: Julie Rowland, female    DOB: 02/27/1971, 44 y.o.   MRN: [id number] CC: worsening abdominal pain and distension  HPI 44 yo F with liver disease, H/O of ETOH abuse presents for follow up:  1. Ascites: worsening. With b/l lower abdominal pain. Pruritus. Subjective fever. No nausea, emesis or diarrhea. Taking lasix and aldactone once daily. Taking tylenol #3 and tramadol for pain. Missed work yesterday due to pain. Concerned about being able to pay her rent. Denies ETOH. Denies intercourse since IUD removal.   Soc Hx: former smoker, quit in 07/2014  Review of Systems  Constitutional: Positive for fever and fatigue. Negative for chills.  Respiratory: Positive for shortness of breath.   Cardiovascular: Positive for leg swelling. Negative for chest pain.  Gastrointestinal: Positive for abdominal pain and abdominal distention. Negative for nausea, diarrhea, constipation, blood in stool, anal bleeding and rectal pain.  Psychiatric/Behavioral: Positive for sleep disturbance and decreased concentration. Negative for confusion.       Objective:   Physical Exam BP 146/80 mmHg  Pulse 94  Temp(Src) 98.5 F (36.9 C)  Resp 16  Ht  (1.753 m)  Wt 262 lb (118.842 kg)  BMI 38.67 kg/m2  SpO2 99% General appearance: alert, cooperative and no distress Lungs: clear to auscultation bilaterally Abdomen: round, distended, TTP b/l LQ,, hepatomegaly. 52 inches in largest diameter. Evidence of excoriation on abdomen Ext: 1 + edema with evidence of excoriation on legs       Assessment & Plan:

## 2014-11-04 NOTE — Progress Notes (Signed)
Abdominal pain, rash on abdominal area

## 2014-11-04 NOTE — Patient Instructions (Addendum)
Julie Rowland,  Thank you for coming in today  1. Paracentesis scheduled. It is a work in appt so be prepared to wait  Checking labs today  Stop tylenol #3  In order to prevent fluid from coming back  Continue lasix 40 mg daily Increase aldactone to 25 mg twice daily  Low salt diet   GI appointment has been moved to next Tuesday   F/u with me in 2 weeks for ascites   Dr. Armen Pickup

## 2014-11-04 NOTE — Assessment & Plan Note (Addendum)
A: NASH vs alcoholic liver disease, patient denies ongoing ETOH intake. Worsening ascites with abdominal pain. No fever. R/o SBP.  P:  Paracentesis scheduled for today, max fluid removal will be 6 L, albumin will be administered  CMP, ammonia and INR check today  Moved up GI appt with next week Tuesday    Patient instructed to make the following medication changes: Stop tylenol #3 Continue lasix 40 mg daily Increase aldactone to 25 mg twice daily  Low salt diet

## 2014-11-05 LAB — AMMONIA: Ammonia: 93 umol/L — ABNORMAL HIGH (ref 16–53)

## 2014-11-05 NOTE — Telephone Encounter (Signed)
Called back to patient Left VM Asked for call back

## 2014-11-08 ENCOUNTER — Telehealth: Payer: Self-pay | Admitting: *Deleted

## 2014-11-08 ENCOUNTER — Ambulatory Visit: Payer: Self-pay | Admitting: Family Medicine

## 2014-11-08 NOTE — Telephone Encounter (Signed)
Pt aware of results 

## 2014-11-08 NOTE — Telephone Encounter (Signed)
-----   Message from Dessa Phi, MD sent at 11/05/2014  1:03 PM EDT ----- Ammonia level and liver function test stable Albumin low

## 2014-11-09 ENCOUNTER — Other Ambulatory Visit: Payer: Self-pay

## 2014-11-09 ENCOUNTER — Encounter: Payer: Self-pay | Admitting: Nurse Practitioner

## 2014-11-09 ENCOUNTER — Ambulatory Visit (INDEPENDENT_AMBULATORY_CARE_PROVIDER_SITE_OTHER): Payer: Self-pay | Admitting: Nurse Practitioner

## 2014-11-09 VITALS — BP 146/83 | HR 95 | Temp 98.3°F | Ht 69.0 in | Wt 260.0 lb

## 2014-11-09 DIAGNOSIS — K769 Liver disease, unspecified: Secondary | ICD-10-CM

## 2014-11-09 DIAGNOSIS — K766 Portal hypertension: Secondary | ICD-10-CM

## 2014-11-09 DIAGNOSIS — R600 Localized edema: Secondary | ICD-10-CM

## 2014-11-09 MED ORDER — SPIRONOLACTONE 50 MG PO TABS: 50.0000 mg | ORAL_TABLET | Freq: Two times a day (BID) | ORAL | Status: DC

## 2014-11-09 NOTE — Progress Notes (Signed)
Primary Care Physician:  Julie Paula, MD Primary Gastroenterologist:  Dr. Darrick Penna  Chief Complaint  Patient presents with  . Abdominal Pain    HPI:   44 year old female referred primary care for evaluation of liver disease. Last PCP note reviewed. Patient with apparent history of Julie Rowland versus alcoholic liver disease although patient denies current alcohol use. Hepatitis panel ordered 07/01/2014 was negative. Patient was seen by PCP for "worsening ascites." Ultrasound paracentesis ordered but ultrasound demonstrated not enough fluid for removal. She has had elevated ammonia in the past and is on lactulose with a goal of 2-3 soft bowel movements daily. Last ammonia level 91. Last CMP ordered 11/04/2014 shows total bili elevated at 3.7, alkaline phosphatase elevated at 123, AST/ALT elevated at 109/40, albumin low at 2.6. CT of the abdomen and pelvis completed 07/01/2014 shows area of heterogeneity seen within the liver somewhat nodular in appearance, could represent fatty infiltration, underlying masses Be excluded and recommend dynamic liver protocol MRI or CT to further evaluate. Also noted presence of predominantly new varices raising concern for mild hepatic cirrhosis. Patient has been unable to complete MRI due to claustrophobia, and unable to complete open MRI for unknown reasons.  Today she states her labs have been good at last lab draw but her abdomen is enlarged. Had umbilical hemorrhage in March and was taken to the hospital. Has apparent blood vessels which hurt. Had surgery for swollen abdominal vessels by vascular surgery. Was told to stop drinking and she has. Was told she does not have cirrhosis. Used to abuse alcohol. Denies upper abdominal pain, N/V, hematochezia, melena, yellowing of skin or eyes, darkened urine. Was first told of liver abnormalities in March due to CT. Has never had an endoscopy before. Currently on Lasix 40 mg bid and aldactone 25 mg bid. She could not have  open MRI because [facility] doesn't have open MRI and only available facility is [facility] and is in the process of applying for financial assistance with them to have the open MRI done. Denies fever, chills, unintentional weight loss. Has some LE edema. Denies recent confusion. Has a bowel movement once daily. Is not taking lactulose. Denies chest pain, dyspnea, dizziness, lightheadedness, syncope, near syncope. Denies any other upper or lower GI symptoms.  Past Medical History  Diagnosis Date  . Migraine headache     At age 47  . Hypertension Dx 2003  . Anxiety Dx 2002  . Seizures 2014 and 2009    Past Surgical History  Procedure Laterality Date  . Colonoscopy  2015  . Vein ligation and stripping N/A 07/14/2014    Procedure: EXCISION OF VARICOSE VEINS AT UMBILICUS;  Surgeon: Larina Earthly, MD;  Location: [facility] OR;  Service: Vascular;  Laterality: N/A;  . Umbilical hernia repair N/A 07/14/2014    Procedure: HERNIA REPAIR UMBILICAL ADULT;  Surgeon: Larina Earthly, MD;  Location: [facility] OR;  Service: Vascular;  Laterality: N/A;    Current Outpatient Prescriptions  Medication Sig Dispense Refill  . ALPRAZolam (XANAX) 1 MG tablet Take 1 mg by mouth at bedtime as needed for anxiety.    . cyclobenzaprine (FLEXERIL) 10 MG tablet Take 1 tablet (10 mg total) by mouth 2 (two) times daily as needed for muscle spasms (caution will cause drowsiness). For headache per patient 45 tablet 1  . Elastic Bandages & Supports (MEDICAL COMPRESSION STOCKINGS) MISC 1 each by Does not apply route daily. 20 mmHg pressure 2 each 1  . famotidine (PEPCID) 20 MG tablet  Take 1 tablet (20 mg total) by mouth 2 (two) times daily. 30 tablet 0  . furosemide (LASIX) 40 MG tablet Take 1 tablet (40 mg total) by mouth daily. 30 tablet 3  . Lactulose 20 GM/30ML SOLN Take 30 mLs (20 g total) by mouth 3 (three) times daily. 946 mL 0  . spironolactone (ALDACTONE) 25 MG tablet Take 1 tablet (25 mg total) by mouth 2 (two) times daily. 30 tablet 2    . traMADol (ULTRAM) 50 MG tablet Take 1 tablet (50 mg total) by mouth every 8 (eight) hours as needed. 60 tablet 2  . zolpidem (AMBIEN) 10 MG tablet Take 10 mg by mouth at bedtime as needed for sleep.     No current facility-administered medications for this visit.    Allergies as of 11/09/2014  . (No Known Allergies)    Family History  Problem Relation Age of Onset  . Hypertension Mother   . Cancer Mother   . Diabetes Mother     History   Social History  . Marital Status: Married    Spouse Name: N/A  . Number of Children: N/A  . Years of Education: N/A   Occupational History  . Not on file.   Social History Main Topics  . Smoking status: Former Smoker -- 20 years    Types: Cigarettes    Quit date: 08/02/2014  . Smokeless tobacco: Never Used     Comment: cut back to 4-5 cigarettes per day  . Alcohol Use: 0.0 oz/week    0 Standard drinks or equivalent per week     Comment: occasionally  . Drug Use: No  . Sexual Activity: Yes    Birth Control/ Protection: IUD   Other Topics Concern  . Not on file   Social History Narrative    Review of Systems: General: Negative for anorexia, weight loss, fever, chills, fatigue, weakness. Eyes: Negative for vision changes.  ENT: Negative for hoarseness, difficulty swallowing. CV: Negative for chest pain, angina, palpitations, peripheral edema.  Respiratory: Negative for dyspnea at rest, cough, sputum, wheezing.  GI: See history of present illness. Derm: Admits itching on her abdominal area.  Neuro: Negative for weakness, abnormal sensation, seizure, memory loss, confusion.  Heme: Negative for bruising or bleeding. Allergy: Negative for rash or hives.    Physical Exam: BP 146/83 mmHg  Pulse 95  Temp(Src) 98.3 F (36.8 C) (Oral)  Ht 5\' 9"  (1.753 m)  Wt 260 lb (117.935 kg)  BMI 38.38 kg/m2 General:   Alert and oriented. Pleasant and cooperative. Well-nourished and well-developed.  Head:  Normocephalic and  atraumatic. Eyes:  Without icterus, sclera clear and conjunctiva pink.  Ears:  Normal auditory acuity. Cardiovascular:  S1, S2 present without murmurs appreciated. Normal pulses noted. Extremities without clubbing. Respiratory:  Clear to auscultation bilaterally. No wheezes, rales, or rhonchi. No distress.  Gastrointestinal:  +BS, distended but soft, positive lower abdominal supeficial tenderness. Gross hepatomegaly approximately 6 fingerbreadths below the right costal margin, no splenomegaly noted. No guarding or rebound. No masses appreciated. Bilateral 1-2+ pedal edema noted. Rectal:  Deferred  Skin:  Intact without significant lesions or rashes. Neurologic:  Alert and oriented x4;  grossly normal neurologically. Psych:  Alert and cooperative. Normal mood and affect. Heme/Lymph/Immune: No excessive bruising noted.    11/09/2014 11:38 AM

## 2014-11-09 NOTE — Patient Instructions (Signed)
1. Have your labs drawn when you're able. 2. We will schedule your endoscopy with Dr. Darrick Penna for you. 3. We will order the MRI to be done in [facility] imaging with conscious assistance for open MRI. 4. Increase your Aldactone to 50 mg twice a day. Keep taking the HCTZ 40 mg once a day. 5. As we discussed start taking the lactulose liquid suite tasting laxative once a day. The goal is to have 2-3 soft bowel movements a day. 6. Return for follow-up in 6-8 weeks.

## 2014-11-09 NOTE — Assessment & Plan Note (Addendum)
Patient diagnosed with portal hypertension. CT of the abdomen showed possible fatty infiltration although underlying mass could not be excluded. MRI has been ordered but the patient is claustrophobic, and she noted only facility in [place] at open MRI does not take on assistance. We contacted [facility] radiology who has a form of open MRI and accepts [facility] assistance and we will plan for her MRI to be done here to further evaluate her liver. At this point elastography for fibrosis evaluation not likely accurate due to her abdominal size. Hepatitis panel has been completed and negative. Today we will check liver autoimmune labs, CBC, BMP, HSP, and ammonia. Her ammonia has been persistently elevated with last level at 93. She states today she is not taking lactulose. I reaffirmed that she should be taking lactulose to prevent ammonia buildup and subsequent confusion. We'll have her return for follow-up in 6-8 weeks  Liver disease likely due to alcoholic liver disease/NASH. Likely has fibrosis/cirrhosis although this has yet to be demonstrated on immaging or biopsy. Encouraged to continue abstaining from alcohol and hepatotoxic drugs such as Tylenol.

## 2014-11-09 NOTE — Assessment & Plan Note (Signed)
44 year old female diagnosed with portal hypertension and noted umbilical vein varices. Also noted new varices on CT of the abdomen. She has not had an endoscopy yet. Also with noted lower extremity edema and abdominal swelling although ultrasound for paracentesis noted not enough fluid to drain. At this point we will plan for an endoscopy in the OR with propofol to evaluate for esophageal varices, increase her diuretics hydrochlorothiazide 40 mg daily and spironolactone 50 mg twice a day. We'll have her return in 6-8 weeks for further evaluation.  Proceed with EGD with Dr. Darrick Penna in the OR with propofol near future: the risks, benefits, and alternatives have been discussed with the patient in detail. The patient states understanding and desires to proceed.  The patient is not on any anticoagulants, she is on Xanax, Flexeril, Ultram, and Ambien. Due to polypharmacy we'll plan for procedure and the OR with propofol to ensure adequate sedation.

## 2014-11-09 NOTE — Assessment & Plan Note (Signed)
Bilateral lower extremity edema although improved as well as abdominal swelling with ascites noted on ultrasound although not enough fluid to drain percutaneously. We will increase her diuretics to standard dosing of HCTZ 40 mg daily and spironolactone 50 mg twice a day. Return for follow-up in 6-8 weeks.

## 2014-11-16 NOTE — Progress Notes (Signed)
CC'ED TO PCP 

## 2014-11-22 ENCOUNTER — Ambulatory Visit
Admission: RE | Admit: 2014-11-22 | Discharge: 2014-11-22 | Disposition: A | Discharge status: Home / Self Care | Payer: No Typology Code available for payment source | Source: Ambulatory Visit | Attending: Nurse Practitioner | Admitting: Nurse Practitioner

## 2014-11-22 DIAGNOSIS — K769 Liver disease, unspecified: Secondary | ICD-10-CM

## 2014-11-22 MED ORDER — GADOXETATE DISODIUM 0.25 MMOL/ML IV SOLN
10.0000 mL | Freq: Once | INTRAVENOUS | Status: AC | PRN
  Administered 2014-11-22: 10 mL via INTRAVENOUS

## 2014-11-23 ENCOUNTER — Ambulatory Visit: Payer: No Typology Code available for payment source | Attending: Family Medicine | Admitting: Family Medicine

## 2014-11-23 ENCOUNTER — Encounter: Payer: Self-pay | Admitting: Family Medicine

## 2014-11-23 ENCOUNTER — Ambulatory Visit: Payer: Self-pay | Admitting: Nurse Practitioner

## 2014-11-23 VITALS — BP 118/73 | HR 67 | Temp 98.7°F | Resp 18 | Ht 69.0 in | Wt 258.0 lb

## 2014-11-23 DIAGNOSIS — K769 Liver disease, unspecified: Secondary | ICD-10-CM | POA: Insufficient documentation

## 2014-11-23 DIAGNOSIS — M79671 Pain in right foot: Secondary | ICD-10-CM | POA: Insufficient documentation

## 2014-11-23 DIAGNOSIS — R1030 Lower abdominal pain, unspecified: Secondary | ICD-10-CM

## 2014-11-23 DIAGNOSIS — R600 Localized edema: Secondary | ICD-10-CM

## 2014-11-23 DIAGNOSIS — M722 Plantar fascial fibromatosis: Secondary | ICD-10-CM | POA: Insufficient documentation

## 2014-11-23 DIAGNOSIS — K766 Portal hypertension: Secondary | ICD-10-CM

## 2014-11-23 DIAGNOSIS — Z87891 Personal history of nicotine dependence: Secondary | ICD-10-CM | POA: Insufficient documentation

## 2014-11-23 MED ORDER — SPIRONOLACTONE 50 MG PO TABS: 50.0000 mg | ORAL_TABLET | Freq: Two times a day (BID) | ORAL | Status: DC

## 2014-11-23 MED ORDER — FUROSEMIDE 40 MG PO TABS: 40.0000 mg | ORAL_TABLET | Freq: Two times a day (BID) | ORAL | Status: DC

## 2014-11-23 MED ORDER — ACETAMINOPHEN-CODEINE #3 300-30 MG PO TABS: 1.0000 | ORAL_TABLET | Freq: Every day | ORAL | Status: DC | PRN

## 2014-11-23 NOTE — Progress Notes (Signed)
f/u ascites Complaining of rt foot pain and swelling  Medication question and refills

## 2014-11-23 NOTE — Assessment & Plan Note (Signed)
R heel pain: Referral to sports medicine Stretches

## 2014-11-23 NOTE — Progress Notes (Signed)
   Subjective:    Patient ID: Julie Rowland, female    DOB: [birth year], 44 y.o.   MRN: [id number] CC: f/u  Liver disease, R heel pain  HPI 44 yo F with liver disease presents for f/u  1. R heel pain: x 3 weeks. Pain with walking. No trauma. Patient has gained weight. Patient has foot swelling.  2. Liver disease: has established with GI. MRI done last night revealed lipomas in liver. No N/V/D. Lower abdominal pain and swelling. Leg swelling. Taking lasix 40 mg daily and aldactone 25 mg BID, has not increased dose to 50 mg BID as instructed.   History  Substance Use Topics  . Smoking status: Former Smoker -- 20 years    Types: Cigarettes    Quit date: 08/02/2014  . Smokeless tobacco: Never Used     Comment: cut back to 4-5 cigarettes per day  . Alcohol Use: 0.0 oz/week    0 Standard drinks or equivalent per week     Comment: Has not had ETOH in 09/08/14   Review of Systems  Constitutional: Negative for fever and chills.  Eyes: Negative for visual disturbance.  Respiratory: Negative for shortness of breath.   Cardiovascular: Positive for leg swelling. Negative for chest pain.  Gastrointestinal: Negative for nausea, vomiting, abdominal pain, diarrhea, constipation, blood in stool, anal bleeding and rectal pain.  Musculoskeletal: Positive for arthralgias. Negative for back pain.  Skin: Negative for rash.  Allergic/Immunologic: Negative for immunocompromised state.  Hematological: Negative for adenopathy. Does not bruise/bleed easily.  Psychiatric/Behavioral: Negative for suicidal ideas and dysphoric mood.      Objective:   Physical Exam  Constitutional: She is oriented to person, place, and time. She appears well-developed and well-nourished. No distress.  HENT:  Head: Normocephalic and atraumatic.  Eyes: EOM are normal. Pupils are equal, round, and reactive to light. Scleral icterus is present.  Cardiovascular: Normal rate, regular rhythm, normal heart sounds and intact  distal pulses.   Pulmonary/Chest: Effort normal and breath sounds normal.  Abdominal: Soft. Bowel sounds are normal. She exhibits distension. She exhibits no mass. There is no tenderness. There is no rebound and no guarding.  Musculoskeletal: She exhibits edema and tenderness (foot tenderness R plantar ).  Neurological: She is alert and oriented to person, place, and time.  Skin: Skin is warm and dry. No rash noted.  Psychiatric: She has a normal mood and affect.  BP 118/73 mmHg  Pulse 67  Temp(Src) 98.7 F (37.1 C) (Oral)  Resp 18  Ht  (1.753 m)  Wt 258 lb (117.028 kg)  BMI 38.08 kg/m2  SpO2 99% Wt Readings from Last 3 Encounters:  11/23/14 258 lb (117.028 kg)  11/09/14 260 lb (117.935 kg)  11/04/14 262 lb (118.842 kg)       Assessment & Plan:

## 2014-11-23 NOTE — Assessment & Plan Note (Signed)
Liver disease: Increase lasix to 40 mg twice daily Aldactone 50 mg twice daily   Labs ordered  Tylenol #3 once daily as we are limiting tylenol as much as possible

## 2014-11-23 NOTE — Patient Instructions (Signed)
Ms. Resnick,  1. Liver disease: Increase lasix to 40 mg twice daily Aldactone 50 mg twice daily   Labs ordered  Tylenol #3 once daily as we are limiting tylenol as much as possible  2. R heel pain: Referral to sports medicine Stretches  F/u in 3 weeks for labs- CMP F/u with me in 6 weeks for liver disease  Dr. Armen Pickup

## 2014-11-24 LAB — CBC WITH DIFFERENTIAL/PLATELET
BASOS PCT: 0 % (ref 0–1)
Basophils Absolute: 0 10*3/uL (ref 0.0–0.1)
Eosinophils Absolute: 0.3 10*3/uL (ref 0.0–0.7)
Eosinophils Relative: 3 % (ref 0–5)
HCT: 32.7 % — ABNORMAL LOW (ref 36.0–46.0)
Hemoglobin: 11.4 g/dL — ABNORMAL LOW (ref 12.0–15.0)
Lymphocytes Relative: 21 % (ref 12–46)
Lymphs Abs: 2 10*3/uL (ref 0.7–4.0)
MCH: 36.3 pg — ABNORMAL HIGH (ref 26.0–34.0)
MCHC: 34.9 g/dL (ref 30.0–36.0)
MCV: 104.1 fL — ABNORMAL HIGH (ref 78.0–100.0)
MONOS PCT: 10 % (ref 3–12)
MPV: 9.1 fL (ref 8.6–12.4)
Monocytes Absolute: 1 10*3/uL (ref 0.1–1.0)
NEUTROS ABS: 6.4 10*3/uL (ref 1.7–7.7)
NEUTROS PCT: 66 % (ref 43–77)
PLATELETS: 89 10*3/uL — AB (ref 150–400)
RBC: 3.14 MIL/uL — ABNORMAL LOW (ref 3.87–5.11)
RDW: 15.3 % (ref 11.5–15.5)
WBC: 9.7 10*3/uL (ref 4.0–10.5)

## 2014-11-24 LAB — BASIC METABOLIC PANEL
BUN: 7 mg/dL (ref 7–25)
CHLORIDE: 96 mmol/L — AB (ref 98–110)
CO2: 28 mmol/L (ref 20–31)
Calcium: 9 mg/dL (ref 8.6–10.2)
Creat: 0.72 mg/dL (ref 0.50–1.10)
Glucose, Bld: 104 mg/dL — ABNORMAL HIGH (ref 65–99)
Potassium: 3.7 mmol/L (ref 3.5–5.3)
Sodium: 135 mmol/L (ref 135–146)

## 2014-11-24 LAB — HEPATIC FUNCTION PANEL
ALK PHOS: 125 U/L — AB (ref 33–115)
ALT: 44 U/L — ABNORMAL HIGH (ref 6–29)
AST: 120 U/L — ABNORMAL HIGH (ref 10–30)
Albumin: 2.5 g/dL — ABNORMAL LOW (ref 3.6–5.1)
Bilirubin, Direct: 2.5 mg/dL — ABNORMAL HIGH (ref <=0.2)
Indirect Bilirubin: 2.3 mg/dL — ABNORMAL HIGH (ref 0.2–1.2)
TOTAL PROTEIN: 7.7 g/dL (ref 6.1–8.1)
Total Bilirubin: 4.8 mg/dL — ABNORMAL HIGH (ref 0.2–1.2)

## 2014-11-24 LAB — ANA: Anti Nuclear Antibody(ANA): NEGATIVE

## 2014-11-24 LAB — AMMONIA: Ammonia: 99 umol/L — ABNORMAL HIGH (ref 16–53)

## 2014-11-26 LAB — ANTI-MICROSOMAL ANTIBODY LIVER / KIDNEY: LKM1 Ab: <=20 U (ref <=20.0)

## 2014-11-26 LAB — ANTI-SMOOTH MUSCLE ANTIBODY, IGG: Smooth Muscle Ab: 39 U — ABNORMAL HIGH (ref <20)

## 2014-11-29 ENCOUNTER — Encounter: Payer: Self-pay | Admitting: Family Medicine

## 2014-11-29 ENCOUNTER — Ambulatory Visit (INDEPENDENT_AMBULATORY_CARE_PROVIDER_SITE_OTHER): Payer: No Typology Code available for payment source | Admitting: Family Medicine

## 2014-11-29 VITALS — BP 110/69 | HR 82 | Ht 69.0 in | Wt 250.0 lb

## 2014-11-29 DIAGNOSIS — M79671 Pain in right foot: Secondary | ICD-10-CM

## 2014-11-29 DIAGNOSIS — M722 Plantar fascial fibromatosis: Secondary | ICD-10-CM

## 2014-11-29 MED ORDER — METHYLPREDNISOLONE ACETATE 40 MG/ML IJ SUSP
40.0000 mg | Freq: Once | INTRAMUSCULAR | Status: AC
  Administered 2014-11-29: 40 mg via INTRA_ARTICULAR

## 2014-11-29 NOTE — Patient Instructions (Signed)
You have plantar fasciitis Plantar fascia stretch for 20-30 seconds (do 3 of these) in morning Lowering/raise on a step exercises 3 x 10 once or twice a day - this is very important for long term recovery. Can add heel walks, toe walks forward and backward as well Ice heel for 15 minutes as needed. Avoid flat shoes/barefoot walking as much as possible. Arch straps have been shown to help with pain. Sports insoles with scaphoid pads - wear at all times when up and walking around. Steroid injection is a consideration for short term pain relief if you are struggling - you were given this today. Physical therapy is also an option. Follow up with me in 6 weeks.

## 2014-11-30 NOTE — Progress Notes (Signed)
PCP: Lora Paula, MD  Subjective:   HPI: Patient is a 44 y.o. female here for right heel pain.  Patient denies known injury. She reports over 5 weeks of plantar right heel pain. 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 Worse by end of her shifts. No radiation. Tried ice bottle.  Past Medical History  Diagnosis Date  . Migraine headache     At age 75  . Hypertension Dx 2003  . Anxiety Dx 2002  . Seizures 2014 and 2009  . Portal hypertension   . Ascites     Current Outpatient Prescriptions on File Prior to Visit  Medication Sig Dispense Refill  . acetaminophen-codeine (TYLENOL #3) 300-30 MG per tablet Take 1 tablet by mouth daily as needed for severe pain. 30 tablet 0  . ALPRAZolam (XANAX) 1 MG tablet Take 1 mg by mouth at bedtime as needed for anxiety.    . cyclobenzaprine (FLEXERIL) 10 MG tablet Take 1 tablet (10 mg total) by mouth 2 (two) times daily as needed for muscle spasms (caution will cause drowsiness). For headache per patient 45 tablet 1  . Elastic Bandages & Supports (MEDICAL COMPRESSION STOCKINGS) MISC 1 each by Does not apply route daily. 20 mmHg pressure (Patient not taking: Reported on 11/23/2014) 2 each 1  . famotidine (PEPCID) 20 MG tablet Take 1 tablet (20 mg total) by mouth 2 (two) times daily. 30 tablet 0  . furosemide (LASIX) 40 MG tablet Take 1 tablet (40 mg total) by mouth 2 (two) times daily. 60 tablet 3  . Lactulose 20 GM/30ML SOLN Take 30 mLs (20 g total) by mouth 3 (three) times daily. 946 mL 0  . spironolactone (ALDACTONE) 50 MG tablet Take 1 tablet (50 mg total) by mouth 2 (two) times daily. 60 tablet 3  . traMADol (ULTRAM) 50 MG tablet Take 1 tablet (50 mg total) by mouth every 8 (eight) hours as needed. 60 tablet 2  . zolpidem (AMBIEN) 10 MG tablet Take 10 mg by mouth at bedtime as needed for sleep.     No current facility-administered medications on file prior to visit.    Past Surgical History  Procedure Laterality Date  .  Colonoscopy  2015  . Vein ligation and stripping N/A 07/14/2014    Procedure: EXCISION OF VARICOSE VEINS AT UMBILICUS;  Surgeon: Larina Earthly, MD;  Location: [facility] OR;  Service: Vascular;  Laterality: N/A;  . Umbilical hernia repair N/A 07/14/2014    Procedure: HERNIA REPAIR UMBILICAL ADULT;  Surgeon: Larina Earthly, MD;  Location[facility] OR;  Service: Vascular;  Laterality: N/A;    No Known Allergies  Social History   Social History  . Marital Status: Legally Separated    Spouse Name: N/A  . Number of Children: N/A  . Years of Education: N/A   Occupational History  . Not on file.   Social History Main Topics  . Smoking status: Former Smoker -- 20 years    Types: Cigarettes    Quit date: 08/02/2014  . Smokeless tobacco: Never Used     Comment: cut back to 4-5 cigarettes per day  . Alcohol Use: 0.0 oz/week    0 Standard drinks or equivalent per week     Comment: Has not had ETOH in 09/08/14  . Drug Use: No  . Sexual Activity: Yes    Birth Control/ Protection: IUD   Other Topics Concern  . Not on file   Social History Narrative    Family History  Problem Relation Age of Onset  . Hypertension Mother   . Cancer Mother   . Diabetes Mother   . Colon cancer Neg Hx   . Liver disease      BP 110/69 mmHg  Pulse 82  Ht  (1.753 m)  Wt 250 lb (113.399 kg)  BMI 36.90 kg/m2  Review of Systems: See HPI above.    Objective:  Physical Exam:  Gen: NAD  Right foot/ankle: No gross deformity, swelling, ecchymoses Mild overpronation FROM ankle with 5/5 strength all directions. TTP insertion of plantar fascia on medial calcaneus.  No other tenderness. Negative ant drawer and talar tilt.   Negative syndesmotic compression. Negative calcaneal squeeze. Thompsons test negative. NV intact distally.    Assessment & Plan:  1. Right plantar fasciitis - shown home exercises, stretches to do daily.  Icing, arch binders, sports insoles with scaphoid pads.  Injection given as well.   F/u in 6 weeks.  After informed written consent patient was seated on exam table.  Area overlying medial insertion of right plantar fascia on calcaneus prepped with alcohol swab then plantar fascia injected with 2:1 marcaine: depomedrol with multiple needle fenestrations.  Patient tolerated procedure well without immediate complications.

## 2014-11-30 NOTE — Assessment & Plan Note (Signed)
shown home exercises, stretches to do daily.  Icing, arch binders, sports insoles with scaphoid pads.  Injection given as well.  F/u in 6 weeks.  After informed written consent patient was seated on exam table.  Area overlying medial insertion of right plantar fascia on calcaneus prepped with alcohol swab then plantar fascia injected with 2:1 marcaine: depomedrol with multiple needle fenestrations.  Patient tolerated procedure well without immediate complications.

## 2014-12-01 ENCOUNTER — Telehealth: Payer: Self-pay | Admitting: Family Medicine

## 2014-12-01 NOTE — Telephone Encounter (Signed)
That's fine.  I believe she said she doesn't work Tuesday or Wednesday this week.  Thanks!

## 2014-12-01 NOTE — Telephone Encounter (Signed)
Letter written and signed.

## 2014-12-03 ENCOUNTER — Encounter (HOSPITAL_COMMUNITY)
Admission: RE | Admit: 2014-12-03 | Discharge: 2014-12-03 | Disposition: A | Discharge status: Home / Self Care | Payer: No Typology Code available for payment source | Source: Ambulatory Visit | Attending: Gastroenterology | Admitting: Gastroenterology

## 2014-12-03 ENCOUNTER — Encounter (HOSPITAL_COMMUNITY): Payer: Self-pay

## 2014-12-07 ENCOUNTER — Ambulatory Visit (HOSPITAL_COMMUNITY)
Admission: RE | Admit: 2014-12-07 | Discharge: 2014-12-07 | Disposition: A | Discharge status: Home / Self Care | Payer: Self-pay | Source: Ambulatory Visit | Attending: Gastroenterology | Admitting: Gastroenterology

## 2014-12-07 ENCOUNTER — Ambulatory Visit (HOSPITAL_COMMUNITY): Payer: Self-pay | Admitting: Anesthesiology

## 2014-12-07 ENCOUNTER — Ambulatory Visit (HOSPITAL_COMMUNITY): Payer: No Typology Code available for payment source | Admitting: Anesthesiology

## 2014-12-07 ENCOUNTER — Encounter (HOSPITAL_COMMUNITY): Payer: Self-pay

## 2014-12-07 ENCOUNTER — Encounter (HOSPITAL_COMMUNITY)
Admission: RE | Disposition: A | Discharge status: Home / Self Care | Payer: Self-pay | Source: Ambulatory Visit | Attending: Gastroenterology

## 2014-12-07 DIAGNOSIS — K769 Liver disease, unspecified: Secondary | ICD-10-CM

## 2014-12-07 DIAGNOSIS — R600 Localized edema: Secondary | ICD-10-CM

## 2014-12-07 DIAGNOSIS — K3189 Other diseases of stomach and duodenum: Secondary | ICD-10-CM | POA: Insufficient documentation

## 2014-12-07 DIAGNOSIS — Z809 Family history of malignant neoplasm, unspecified: Secondary | ICD-10-CM | POA: Insufficient documentation

## 2014-12-07 DIAGNOSIS — Z79899 Other long term (current) drug therapy: Secondary | ICD-10-CM | POA: Insufficient documentation

## 2014-12-07 DIAGNOSIS — I10 Essential (primary) hypertension: Secondary | ICD-10-CM | POA: Insufficient documentation

## 2014-12-07 DIAGNOSIS — I8500 Esophageal varices without bleeding: Secondary | ICD-10-CM | POA: Insufficient documentation

## 2014-12-07 DIAGNOSIS — K766 Portal hypertension: Secondary | ICD-10-CM | POA: Insufficient documentation

## 2014-12-07 DIAGNOSIS — F419 Anxiety disorder, unspecified: Secondary | ICD-10-CM | POA: Insufficient documentation

## 2014-12-07 DIAGNOSIS — Z833 Family history of diabetes mellitus: Secondary | ICD-10-CM | POA: Insufficient documentation

## 2014-12-07 HISTORY — PX: ESOPHAGEAL BANDING: SHX5518

## 2014-12-07 HISTORY — PX: ESOPHAGOGASTRODUODENOSCOPY (EGD) WITH PROPOFOL: SHX5813

## 2014-12-07 SURGERY — ESOPHAGOGASTRODUODENOSCOPY (EGD) WITH PROPOFOL
Anesthesia: Monitor Anesthesia Care

## 2014-12-07 MED ORDER — SIMETHICONE 40 MG/0.6ML PO SUSP
ORAL | Status: DC | PRN
Start: 2014-12-07 — End: 2014-12-07
  Administered 2014-12-07: 1000 mL

## 2014-12-07 MED ORDER — MIDAZOLAM HCL 5 MG/5ML IJ SOLN
INTRAMUSCULAR | Status: DC | PRN
  Administered 2014-12-07: 2 mg via INTRAVENOUS

## 2014-12-07 MED ORDER — GLYCOPYRROLATE 0.2 MG/ML IJ SOLN
INTRAMUSCULAR | Status: AC
  Filled 2014-12-07: qty 1

## 2014-12-07 MED ORDER — GLYCOPYRROLATE 0.2 MG/ML IJ SOLN
0.2000 mg | Freq: Once | INTRAMUSCULAR | Status: AC
  Administered 2014-12-07: 0.2 mg via INTRAVENOUS

## 2014-12-07 MED ORDER — CIPROFLOXACIN HCL 500 MG PO TABS: ORAL_TABLET | ORAL | Status: DC

## 2014-12-07 MED ORDER — LACTATED RINGERS IV SOLN
INTRAVENOUS | Status: DC
  Administered 2014-12-07: 10:00:00 via INTRAVENOUS

## 2014-12-07 MED ORDER — FENTANYL CITRATE (PF) 100 MCG/2ML IJ SOLN
INTRAMUSCULAR | Status: AC
  Filled 2014-12-07: qty 4

## 2014-12-07 MED ORDER — CIPROFLOXACIN IN D5W 400 MG/200ML IV SOLN
INTRAVENOUS | Status: AC
  Filled 2014-12-07: qty 200

## 2014-12-07 MED ORDER — LIDOCAINE HCL (CARDIAC) 10 MG/ML IV SOLN
INTRAVENOUS | Status: DC | PRN
  Administered 2014-12-07: 50 mg via INTRAVENOUS

## 2014-12-07 MED ORDER — FENTANYL CITRATE (PF) 100 MCG/2ML IJ SOLN
25.0000 ug | INTRAMUSCULAR | Status: AC
  Administered 2014-12-07: 25 ug via INTRAVENOUS

## 2014-12-07 MED ORDER — MIDAZOLAM HCL 2 MG/2ML IJ SOLN
INTRAMUSCULAR | Status: AC
  Filled 2014-12-07: qty 2

## 2014-12-07 MED ORDER — ONDANSETRON HCL 4 MG/2ML IJ SOLN
4.0000 mg | Freq: Once | INTRAMUSCULAR | Status: AC
  Administered 2014-12-07: 4 mg via INTRAVENOUS

## 2014-12-07 MED ORDER — FENTANYL CITRATE (PF) 100 MCG/2ML IJ SOLN
INTRAMUSCULAR | Status: AC
  Filled 2014-12-07: qty 2

## 2014-12-07 MED ORDER — MIDAZOLAM HCL 2 MG/2ML IJ SOLN
1.0000 mg | INTRAMUSCULAR | Status: DC | PRN
  Administered 2014-12-07: 2 mg via INTRAVENOUS

## 2014-12-07 MED ORDER — FENTANYL CITRATE (PF) 100 MCG/2ML IJ SOLN: 25.0000 ug | INTRAMUSCULAR | Status: DC | PRN

## 2014-12-07 MED ORDER — LIDOCAINE HCL (PF) 1 % IJ SOLN
INTRAMUSCULAR | Status: AC
  Filled 2014-12-07: qty 5

## 2014-12-07 MED ORDER — MIDAZOLAM HCL 2 MG/2ML IJ SOLN
INTRAMUSCULAR | Status: AC
  Filled 2014-12-07: qty 4

## 2014-12-07 MED ORDER — PROPOFOL 10 MG/ML IV BOLUS
INTRAVENOUS | Status: AC
  Filled 2014-12-07: qty 20

## 2014-12-07 MED ORDER — FENTANYL CITRATE (PF) 100 MCG/2ML IJ SOLN
INTRAMUSCULAR | Status: DC | PRN
  Administered 2014-12-07 (×4): 25 ug via INTRAVENOUS

## 2014-12-07 MED ORDER — PROPOFOL INFUSION 10 MG/ML OPTIME
INTRAVENOUS | Status: DC | PRN
  Administered 2014-12-07: 150 ug/kg/min via INTRAVENOUS
  Administered 2014-12-07 (×2): via INTRAVENOUS

## 2014-12-07 MED ORDER — LIDOCAINE VISCOUS 2 % MT SOLN
OROMUCOSAL | Status: AC
  Filled 2014-12-07: qty 15

## 2014-12-07 MED ORDER — LIDOCAINE VISCOUS 2 % MT SOLN: 15.0000 mL | Freq: Once | OROMUCOSAL | Status: DC

## 2014-12-07 MED ORDER — ONDANSETRON HCL 4 MG/2ML IJ SOLN: 4.0000 mg | Freq: Once | INTRAMUSCULAR | Status: DC | PRN

## 2014-12-07 MED ORDER — SPIRONOLACTONE 100 MG PO TABS: 100.0000 mg | ORAL_TABLET | Freq: Every day | ORAL | Status: DC

## 2014-12-07 MED ORDER — CIPROFLOXACIN IN D5W 400 MG/200ML IV SOLN
INTRAVENOUS | Status: DC | PRN
  Administered 2014-12-07: 400 mg via INTRAVENOUS

## 2014-12-07 MED ORDER — ONDANSETRON HCL 4 MG/2ML IJ SOLN
INTRAMUSCULAR | Status: AC
  Filled 2014-12-07: qty 2

## 2014-12-07 SURGICAL SUPPLY — 8 items
BAND LIGATOR SUPER 7 2.8 (MISCELLANEOUS) ×2 IMPLANT
BLOCK BITE 60FR ADLT L/F BLUE (MISCELLANEOUS) ×2 IMPLANT
FLOOR PAD 36X40 (MISCELLANEOUS) ×3
KIT ENDO PROCEDURE PEN (KITS) ×3 IMPLANT
MANIFOLD NEPTUNE II (INSTRUMENTS) ×3 IMPLANT
PAD FLOOR 36X40 (MISCELLANEOUS) IMPLANT
TUBING IRRIGATION ENDOGATOR (MISCELLANEOUS) ×2 IMPLANT
WATER STERILE IRR 1000ML POUR (IV SOLUTION) ×2 IMPLANT

## 2014-12-07 NOTE — Anesthesia Preprocedure Evaluation (Signed)
Anesthesia Evaluation  Patient identified by MRN, date of birth, ID band Patient awake    Reviewed: Allergy & Precautions, NPO status , Patient's Chart, lab work & pertinent test results  Airway Mallampati: II  TM Distance: >3 FB Neck ROM: Full    Dental  (+) Teeth Intact, Dental Advisory Given   Pulmonary former smoker,  breath sounds clear to auscultation        Cardiovascular hypertension, Pt. on medications Rhythm:Regular Rate:Normal     Neuro/Psych  Headaches, Seizures -,  PSYCHIATRIC DISORDERS Anxiety Depression    GI/Hepatic (+) Cirrhosis -  ascites  substance abuse  alcohol use, Portal htn    Endo/Other    Renal/GU      Musculoskeletal   Abdominal (+) + obese,   Peds  Hematology   Anesthesia Other Findings   Reproductive/Obstetrics                             Anesthesia Physical Anesthesia Plan  ASA: III  Anesthesia Plan: MAC   Post-op Pain Management:    Induction: Intravenous  Airway Management Planned: Simple Face Mask  Additional Equipment:   Intra-op Plan:   Post-operative Plan:   Informed Consent: I have reviewed the patients History and Physical, chart, labs and discussed the procedure including the risks, benefits and alternatives for the proposed anesthesia with the patient or authorized representative who has indicated his/her understanding and acceptance.     Plan Discussed with:   Anesthesia Plan Comments:         Anesthesia Quick Evaluation

## 2014-12-07 NOTE — Op Note (Signed)
[facility] [address] [place], [address]   ENDOSCOPY PROCEDURE REPORT  PATIENT: Julie Rowland, Julie Rowland  MR#: [id number] BIRTHDATE: 1970-05-22 , 44  yrs. old GENDER: female  ENDOSCOPIST: West Bali, MD REFERRED [id number] Armen Pickup, MD  PROCEDURE DATE: 2014/12/23 PROCEDURE:   EGD w/ band ligation of varices  INDICATIONS:screening for varices. ,ELD 16, CHILD PUGH C. LAST ETOH YESTERDAY: MIKE'S HARD LEMONADE. MEDICATIONS: Monitored anesthesia care TOPICAL ANESTHETIC:   Viscous Xylocaine ASA CLASS:  DESCRIPTION OF PROCEDURE:     Physical exam was performed.  Informed consent was obtained from the patient after explaining the benefits, risks, and alternatives to the procedure.  The patient was connected to the monitor and placed in the left lateral position.  Continuous oxygen was provided by nasal cannula and IV medicine administered through an indwelling cannula.  After administration of sedation, the patients esophagus was intubated and the     endoscope was advanced under direct visualization to the second portion of the duodenum.  The scope was removed slowly by carefully examining the color, texture, anatomy, and integrity of the mucosa on the way out.  The patient was recovered in endoscopy and discharged home in satisfactory condition.  Estimated blood loss is zero unless otherwise noted in this procedure report.     ESOPHAGUS: There were 4 columns of large varices in the distal esophagus. There was evidence of a white nipple sign.  4 BANDS APPLIED. STOMACH: Moderate portal hypertensive gastropathy was found in the gastric fundus and gastric body.    NORMAL DUODENUM. NO GASTRIC OR DUDOENAL VARICES.       COMPLICATIONS: There were no immediate complications.  ENDOSCOPIC IMPRESSION: 1.   Grade 3 esophageal varices 2.   MODERATE Portal hypertensive gastropathy  RECOMMENDATIONS: STOP DRINKING TODAY.  IF NOT, LIFE EXPECTANCY 1-3 YEARS. PT NEEDS  TO GETINSURANCE. Change Aldactone 50 MG tablets take 2 po daily with FIRST DOSE OF FUROSEMIDE THEN START NEW PRESCRIPTION OF ALDACTONE 100 MG TABLETS ONCE A DAY WITH 40 MG FUROSEMIDE. CONTINUE FUROSEMIDE TWICE DAILY. CIPRO BID FOR 7 DAYS. REPEAT EGD W/ MAC TO ASSESS VARICES IN OCT 2016. FOLLOW UP IN FEB 2017.  REPEAT EXAM:  eSigned:  West Bali, MD 12/23/2014 11:41 AM  CPT CODES: ICD CODES:  The ICD and CPT codes recommended by this software are interpretations from the data that the clinical staff has captured with the software.  The verification of the translation of this report to the ICD and CPT codes and modifiers is the sole responsibility of the health care institution and practicing physician where this report was generated.  [employer] will not be held responsible for the validity of the ICD and CPT codes included on this report.  AMA assumes no liability for data contained or not contained herein. CPT is a Publishing rights manager of the [employer].

## 2014-12-07 NOTE — Discharge Instructions (Signed)
You have esophageal varices. I PLACED FOUR BANDS. YOU HAVE MODERATE PORTAL GASTROPATHY.    YOU MUST STOP DRINKING TODAY. IF YOU DO NOT EXPECT TO LIVE  1 TO 3 MORE YEARS.  Change Aldactone 50 MG tablets take 2 po daily with your FIRST DOSE OF FUROSEMIDE. WHEN YOUR CURRNET BOTTLE IS EMPTY, START THE NEW PRESCRIPTION OF ALDACTONE 100 MG TABLETS ONCE A DAY WITH 40 MG FUROSEMIDE.  CONTINUE FUROSEMIDE TWICE DAILY.   YOU NEED TO TAKE ALL OF YOUR CIPRO AS PRESCRIBED TO PREVENT INFECTION ON THE FLUID IN YOUR ABDOMEN.  REPEAT EGD TO ASSESS VARICES IN OCT 2016.  FOLLOW UP IN FEB 2017.  UPPER ENDOSCOPY AFTER CARE Read the instructions outlined below and refer to this sheet in the next week. These discharge instructions provide you with general information on caring for yourself after you leave the hospital. While your treatment has been planned according to the most current medical practices available, unavoidable complications occasionally occur. If you have any problems or questions after discharge, call DR. Virgilene Stryker, [phone].  ACTIVITY  You may resume your regular activity, but move at a slower pace for the next 24 hours.   Take frequent rest periods for the next 24 hours.   Walking will help get rid of the air and reduce the bloated feeling in your belly (abdomen).   No driving for 24 hours (because of the medicine (anesthesia) used during the test).   You may shower.   Do not sign any important legal documents or operate any machinery for 24 hours (because of the anesthesia used during the test).    NUTRITION  Drink plenty of fluids.   You may resume your normal diet as instructed by your doctor.   Begin with a light meal and progress to your normal diet. Heavy or fried foods are harder to digest and may make you feel sick to your stomach (nauseated).   Avoid alcoholic beverages for 24 hours or as instructed.    MEDICATIONS  You may resume your normal medications.   WHAT  YOU CAN EXPECT TODAY  Some feelings of bloating in the abdomen.   Passage of more gas than usual.    IF YOU HAD A BIOPSY TAKEN DURING THE UPPER ENDOSCOPY:  Eat a soft diet IF YOU HAVE NAUSEA, BLOATING, ABDOMINAL PAIN, OR VOMITING.    FINDING OUT THE RESULTS OF YOUR TEST Not all test results are available during your visit. DR. Darrick Penna WILL CALL YOU WITHIN 7 DAYS OF YOUR PROCEDUE WITH YOUR RESULTS. Do not assume everything is normal if you have not heard from DR. Cash Meadow IN ONE WEEK, CALL HER OFFICE AT [phone].  SEEK IMMEDIATE MEDICAL ATTENTION AND CALL THE OFFICE: [phone] IF:  You have more than a spotting of blood in your stool.   Your belly is swollen (abdominal distention).   You are nauseated or vomiting.   You have a temperature over 101F.   You have abdominal pain or discomfort that is severe or gets worse throughout the day.

## 2014-12-07 NOTE — Transfer of Care (Signed)
Immediate Anesthesia Transfer of Care Note  Patient: Julie Rowland  Procedure(s) Performed: Procedure(s): ESOPHAGOGASTRODUODENOSCOPY (EGD) WITH PROPOFOL (N/A) ESOPHAGEAL BANDING (4 bands) (N/A)  Patient Location: PACU  Anesthesia Type:MAC  Level of Consciousness: awake, alert , oriented and patient cooperative  Airway & Oxygen Therapy: Patient Spontanous Breathing and Patient connected to face mask oxygen  Post-op Assessment: Report given to RN and Post -op Vital signs reviewed and stable  Post vital signs: Reviewed and stable  Last Vitals:  Filed Vitals:   12/07/14 1001  Pulse: 86  Temp: 36.5 C  Resp: 22    Complications: No apparent anesthesia complications

## 2014-12-07 NOTE — Progress Notes (Signed)
REVIEWED-NO ADDITIONAL RECOMMENDATIONS. 

## 2014-12-07 NOTE — H&P (Signed)
Primary Care Physician:  Lora Paula, MD Primary Gastroenterologist:  Dr. Darrick Penna  Pre-Procedure History & Physical: HPI:  Julie Rowland is a 44 y.o. female here for SCREENING FOR VARICES.  Past Medical History  Diagnosis Date  . Migraine headache     At age 89  . Hypertension Dx 2003  . Anxiety Dx 2002  . Seizures 2014 and 2009  . Portal hypertension   . Ascites     Past Surgical History  Procedure Laterality Date  . Colonoscopy  2015  . Vein ligation and stripping N/A 07/14/2014    Procedure: EXCISION OF VARICOSE VEINS AT UMBILICUS;  Surgeon: Larina Earthly, MD;  Location: [facility] OR;  Service: Vascular;  Laterality: N/A;  . Umbilical hernia repair N/A 07/14/2014    Procedure: HERNIA REPAIR UMBILICAL ADULT;  Surgeon: Larina Earthly, MD;  Location: [facility] OR;  Service: Vascular;  Laterality: N/A;    Prior to Admission medications   Medication Sig Start Date End Date Taking? Authorizing Provider  acetaminophen-codeine (TYLENOL #3) 300-30 MG per tablet Take 1 tablet by mouth daily as needed for severe pain. 11/23/14   [name], MD  ALPRAZolam (XANAX) 1 MG tablet Take 1 mg by mouth at bedtime as needed for anxiety.    Historical Provider, MD  cyclobenzaprine (FLEXERIL) 10 MG tablet Take 1 tablet (10 mg total) by mouth 2 (two) times daily as needed for muscle spasms (caution will cause drowsiness). For headache per patient 10/09/12   Cleora Fleet, MD  Elastic Bandages & Supports (MEDICAL COMPRESSION STOCKINGS) MISC 1 each by Does not apply route daily. 20 mmHg pressure Patient not taking: Reported on 11/23/2014 08/23/14   Dessa Phi, MD  famotidine (PEPCID) 20 MG tablet Take 1 tablet (20 mg total) by mouth 2 (two) times daily. 04/12/14   Tatyana Kirichenko, PA-C  furosemide (LASIX) 40 MG tablet Take 1 tablet (40 mg total) by mouth 2 (two) times daily. 11/23/14   [name], MD  Lactulose 20 GM/30ML SOLN Take 30 mLs (20 g total) by mouth 3 (three) times daily. 10/20/14    Dessa Phi, MD  spironolactone (ALDACTONE) 50 MG tablet Take 1 tablet (50 mg total) by mouth 2 (two) times daily. 11/23/14   [name], MD  traMADol (ULTRAM) 50 MG tablet Take 1 tablet (50 mg total) by mouth every 8 (eight) hours as needed. 08/23/14   [name], MD  zolpidem (AMBIEN) 10 MG tablet Take 10 mg by mouth at bedtime as needed for sleep.    Historical Provider, MD    Allergies as of 11/09/2014  . (No Known Allergies)    Family History  Problem Relation Age of Onset  . Hypertension Mother   . Cancer Mother   . Diabetes Mother   . Colon cancer Neg Hx   . Liver disease      Social History   Social History  . Marital Status: Legally Separated    Spouse Name: N/A  . Number of Children: N/A  . Years of Education: N/A   Occupational History  . Not on file.   Social History Main Topics  . Smoking status: Former Smoker -- 20 years    Types: Cigarettes    Quit date: 08/02/2014  . Smokeless tobacco: Never Used     Comment: cut back to 4-5 cigarettes per day  . Alcohol Use: 0.0 oz/week    0 Standard drinks or equivalent per week     Comment: Has not had ETOH in  09/08/14  . Drug Use: No  . Sexual Activity: Yes    Birth Control/ Protection: IUD   Other Topics Concern  . Not on file   Social History Narrative    Review of Systems: See HPI, otherwise negative ROS   Physical Exam: There were no vitals taken for this visit. General:   Alert,  pleasant and cooperative in NAD Head:  Normocephalic and atraumatic. Neck:  Supple; Lungs:  Clear throughout to auscultation.    Heart:  Regular rate and rhythm. Abdomen:  Soft, nontender and nondistended. Normal bowel sounds, without guarding, and without rebound.   Neurologic:  Alert and  oriented x4;  grossly normal neurologically.  Impression/Plan:    SCREENING VARICES  PLAN:  1.EGD TODAY

## 2014-12-07 NOTE — Anesthesia Procedure Notes (Signed)
Procedure Name: MAC Date/Time: 12/07/2014 10:31 AM Performed by: Pernell Dupre, Arda Daggs A Pre-anesthesia Checklist: Timeout performed, Patient identified, Emergency Drugs available, Suction available and Patient being monitored Oxygen Delivery Method: Simple face mask

## 2014-12-07 NOTE — Anesthesia Postprocedure Evaluation (Signed)
  Anesthesia Post-op Note  Patient: Julie Rowland  Procedure(s) Performed: Procedure(s): ESOPHAGOGASTRODUODENOSCOPY (EGD) WITH PROPOFOL (N/A) ESOPHAGEAL BANDING (4 bands) (N/A)  Patient Location: PACU  Anesthesia Type:MAC  Level of Consciousness: awake, alert , oriented and patient cooperative  Airway and Oxygen Therapy: Patient Spontanous Breathing and Patient connected to nasal cannula oxygen  Post-op Pain: none  Post-op Assessment: Post-op Vital signs reviewed, Patient's Cardiovascular Status Stable, Respiratory Function Stable, Patent Airway, No signs of Nausea or vomiting and Pain level controlled              Post-op Vital Signs: Reviewed and stable  Last Vitals:  Filed Vitals:   12/07/14 1001  Pulse: 86  Temp: 36.5 C  Resp: 22    Complications: No apparent anesthesia complications

## 2014-12-08 ENCOUNTER — Encounter (HOSPITAL_COMMUNITY): Payer: Self-pay | Admitting: Gastroenterology

## 2014-12-10 NOTE — Progress Notes (Signed)
EGD completed with 4 columns of large varices with white nipple signs. Varices banded. Diuretics increased. Question if patient is taking lactulose as instructed given persistent ammonia level. EGD, change in diuretics more recent then these results. Has upcoming follow-up visit in a couple weeks. Will reassess medication compliance, repeat labs at that time.

## 2014-12-14 ENCOUNTER — Ambulatory Visit: Payer: No Typology Code available for payment source | Attending: Family Medicine

## 2014-12-14 ENCOUNTER — Telehealth: Payer: Self-pay | Admitting: Family Medicine

## 2014-12-14 DIAGNOSIS — R1030 Lower abdominal pain, unspecified: Secondary | ICD-10-CM

## 2014-12-14 DIAGNOSIS — M79671 Pain in right foot: Secondary | ICD-10-CM

## 2014-12-14 DIAGNOSIS — R600 Localized edema: Secondary | ICD-10-CM

## 2014-12-14 LAB — BASIC METABOLIC PANEL
BUN: 7 mg/dL (ref 7–25)
CALCIUM: 8.5 mg/dL — AB (ref 8.6–10.2)
CO2: 30 mmol/L (ref 20–31)
CREATININE: 1.06 mg/dL (ref 0.50–1.10)
Chloride: 98 mmol/L (ref 98–110)
Glucose, Bld: 105 mg/dL — ABNORMAL HIGH (ref 65–99)
Potassium: 3.8 mmol/L (ref 3.5–5.3)
Sodium: 136 mmol/L (ref 135–146)

## 2014-12-14 MED ORDER — ACETAMINOPHEN-CODEINE #3 300-30 MG PO TABS: 1.0000 | ORAL_TABLET | Freq: Every day | ORAL | Status: DC | PRN

## 2014-12-14 NOTE — Telephone Encounter (Signed)
Patient requesting a refill on acetaminophen-codeine (TYLENOL #3) 300-30 MG per tablet. Please follow up with pt.

## 2014-12-14 NOTE — Telephone Encounter (Signed)
Pt here in our clinic requesting Refill tylenol Ok to refill per Dr Armen Pickup

## 2014-12-15 ENCOUNTER — Other Ambulatory Visit: Payer: Self-pay | Admitting: Family Medicine

## 2014-12-15 DIAGNOSIS — R7989 Other specified abnormal findings of blood chemistry: Secondary | ICD-10-CM | POA: Insufficient documentation

## 2014-12-15 NOTE — Assessment & Plan Note (Signed)
Cr elevation noted on recent BMP in setting of diuretic use  Repeat cr in 2 weeks

## 2014-12-17 ENCOUNTER — Telehealth: Payer: Self-pay | Admitting: Family Medicine

## 2014-12-17 NOTE — Telephone Encounter (Signed)
[employer] form filled out, [employer] requesting medical records Please call patient to come in to sign a release

## 2014-12-21 NOTE — Telephone Encounter (Signed)
LVM to return call.

## 2014-12-21 NOTE — Telephone Encounter (Signed)
-----   Message from Dessa Phi, MD sent at 12/15/2014  9:03 AM EDT ----- Elevation in Cr on recent BMP Repeat needed in 2 weeks, ordered

## 2014-12-28 NOTE — Telephone Encounter (Signed)
LVM to return call.

## 2015-01-03 ENCOUNTER — Encounter: Payer: Self-pay | Admitting: Nurse Practitioner

## 2015-01-03 ENCOUNTER — Other Ambulatory Visit: Payer: Self-pay

## 2015-01-03 ENCOUNTER — Ambulatory Visit (INDEPENDENT_AMBULATORY_CARE_PROVIDER_SITE_OTHER): Payer: No Typology Code available for payment source | Admitting: Nurse Practitioner

## 2015-01-03 VITALS — BP 146/79 | HR 87 | Temp 98.2°F | Ht 69.0 in

## 2015-01-03 DIAGNOSIS — R160 Hepatomegaly, not elsewhere classified: Secondary | ICD-10-CM

## 2015-01-03 DIAGNOSIS — F1010 Alcohol abuse, uncomplicated: Secondary | ICD-10-CM

## 2015-01-03 DIAGNOSIS — F1011 Alcohol abuse, in remission: Secondary | ICD-10-CM

## 2015-01-03 DIAGNOSIS — K769 Liver disease, unspecified: Secondary | ICD-10-CM

## 2015-01-03 NOTE — Progress Notes (Signed)
Referring Provider: Dessa Phi, MD Primary Care Physician:  Lora Paula, MD Primary GI:  Dr. Darrick Penna  Chief Complaint  Patient presents with  . Follow-up    HPI:    44 year old female presents for follow-up on liver disease and to schedule surveillance endoscopy. Last office visit 11/09/2014 when she stated she was no longer drinking about a history of alcohol abuse. Was also having abdominal distention. Have been unable to obtain imaging due to need of open MRI and was applying for financial assistance to allow this. Labs ordered at last visit was completed. Additional labs appear to been ordered and drawn at preoperative visit N/A showed stable with persistently elevated ammonia and transaminases. Anti-smooth muscle antibody was positive at 39, ANA and antimicrosomal antibody negative. AST/ALT 120/44, total bili 4.8, alkaline phosphatase 125, indirect bili 2.3, albumin low at 2.5. Ammonia was elevated at 99. Last INR 11/04/2014 which 1.40 endoscopy completed 12/07/2014 which found 4 columns of grade 3 esophageal varices with bands applied, moderate portal hypertensive gastropathy. Patient admitted to drinking the day prior. She is advised to stop drinking today, change Aldactone to 100 mg once a day with 40 mg of Lasix. Recommend repeat EGD with MAC to assess varices in October 2016. MRI completed 11/23/2014 which found hepatomegaly with mild heterogenous hepatic steatosis and small focal fat lipomas, no lesions or suspicious hepatic findings, abdominal varices, mild ascites.  Today she states she is still having abdominal discomfort where her umbilical veins were treated. Also with some upper abdominal discomfort and abdominal swelling. LE swelling continues. Is currently taking Aldactone  daily and Furosemide  daily. She is urinating very frequently and can tell if she accidentally misses a dose. Denies N/V, yellowing of skin and eyes, darkened urine, hematochezia, and  melena. Denies fever, chills, unintentional weight loss. Denies chest pain, dyspnea, dizziness, lightheadedness, syncope, near syncope. Denies any other upper or lower GI symptoms.  Past Medical History  Diagnosis Date  . Migraine headache     At age 10  . Hypertension Dx 2003  . Anxiety Dx 2002  . Seizures 2014 and 2009  . Portal hypertension   . Ascites     Past Surgical History  Procedure Laterality Date  . Colonoscopy  2015  . Vein ligation and stripping N/A 07/14/2014    Procedure: EXCISION OF VARICOSE VEINS AT UMBILICUS;  Surgeon: Larina Earthly, MD;  Location: [facility] OR;  Service: Vascular;  Laterality: N/A;  . Umbilical hernia repair N/A 07/14/2014    Procedure: HERNIA REPAIR UMBILICAL ADULT;  Surgeon: Larina Earthly, MD;  Location: [facility] OR;  Service: Vascular;  Laterality: N/A;  . Esophagogastroduodenoscopy (egd) with propofol N/A 12/07/2014    [id number] portal hypertensive gastropathy/grade 3 varices  . Esophageal banding N/A 12/07/2014    Procedure: ESOPHAGEAL BANDING (4 bands);  Surgeon: West Bali, MD;  Location: [facility] ORS;  Service: Endoscopy;  Laterality: N/A;    Current Outpatient Prescriptions  Medication Sig Dispense Refill  . acetaminophen-codeine (TYLENOL #3) 300-30 MG per tablet Take 1 tablet by mouth daily as needed for severe pain. 30 tablet 0  . ALPRAZolam (XANAX) 1 MG tablet Take 1 mg by mouth at bedtime as needed for anxiety.    . cyclobenzaprine (FLEXERIL) 10 MG tablet Take 1 tablet (10 mg total) by mouth 2 (two) times daily as needed for muscle spasms (caution will cause drowsiness). For headache per patient 45 tablet 1  . Elastic Bandages & Supports (MEDICAL COMPRESSION STOCKINGS) MISC 1 each  by Does not apply route daily. 20 mmHg pressure 2 each 1  . famotidine (PEPCID) 20 MG tablet Take 1 tablet (20 mg total) by mouth 2 (two) times daily. 30 tablet 0  . furosemide (LASIX) 40 MG tablet Take 1 tablet (40 mg total) by mouth 2 (two) times daily. 60 tablet 3  .  Lactulose 20 GM/30ML SOLN Take 30 mLs (20 g total) by mouth 3 (three) times daily. 946 mL 0  . spironolactone (ALDACTONE) 100 MG tablet Take 1 tablet (100 mg total) by mouth daily. 30 tablet 11  . traMADol (ULTRAM) 50 MG tablet Take 1 tablet (50 mg total) by mouth every 8 (eight) hours as needed. 60 tablet 2  . zolpidem (AMBIEN) 10 MG tablet Take 10 mg by mouth at bedtime as needed for sleep.    . ciprofloxacin (CIPRO) 500 MG tablet 1 po bid for 7 days (Patient not taking: Reported on 01/03/2015) 14 tablet 0   No current facility-administered medications for this visit.    Allergies as of 01/03/2015  . (No Known Allergies)    Family History  Problem Relation Age of Onset  . Hypertension Mother   . Cancer Mother   . Diabetes Mother   . Colon cancer Neg Hx   . Liver disease      Social History   Social History  . Marital Status: Legally Separated    Spouse Name: N/A  . Number of Children: N/A  . Years of Education: N/A   Social History Main Topics  . Smoking status: Former Smoker -- 20 years    Types: Cigarettes    Quit date: 08/02/2014  . Smokeless tobacco: Never Used     Comment: cut back to 4-5 cigarettes per day  . Alcohol Use: 0.0 oz/week    0 Standard drinks or equivalent per week     Comment: Has not had ETOH in 09/08/14  . Drug Use: No  . Sexual Activity: Yes    Birth Control/ Protection: IUD   Other Topics Concern  . None   Social History Narrative    Review of Systems: 10-point ROS negative except as per HPI.   Physical Exam: BP 146/79 mmHg  Pulse 87  Temp(Src) 98.2 F (36.8 C) (Oral)  Ht  (1.753 m) General:   Alert and oriented. Pleasant and cooperative. Well-nourished and well-developed.  Head:  Normocephalic and atraumatic. Eyes:  Without icterus, sclera clear and conjunctiva pink.  Cardiovascular:  S1, S2 present without murmurs appreciated. Extremities without clubbing. Right ankle/foot with 1-2+ pitting edema, left ankle/foot with mild  non-pitting edema. Respiratory:  Clear to auscultation bilaterally. No wheezes, rales, or rhonchi. No distress.  Gastrointestinal:  +BS, obese, distended but soft, non-tender. No HSM noted. No guarding or rebound. No masses appreciated. No jaundice noted. Rectal:  Deferred  Skin:  Intact without significant lesions or rashes. Neurologic:  Alert and oriented x4;  grossly normal neurologically. Psych:  Alert and cooperative. Normal mood and affect. Heme/Lymph/Immune: No excessive bruising noted.    01/03/2015 10:47 AM

## 2015-01-03 NOTE — Assessment & Plan Note (Signed)
Patient with a history of alcohol abuse and has not had any alcohol in 3 years. However she noted to drinking the day before endoscopy. She was again reminded to not drink as this will likely have dark consequences for her survival rate. She states she has no plans to continue drinking as she has kids and wants to be a live as long as possible for them.

## 2015-01-03 NOTE — Patient Instructions (Signed)
1. Continue taking her medications. 2. Continue the good job abstaining from alcohol. 3. We will schedule your procedure for you. 4. Return for follow-up in 4 months for routine care, labs, and imaging.

## 2015-01-03 NOTE — Assessment & Plan Note (Addendum)
Patient with liver disease likely due to history of alcohol abuse and Nash. Previously had not had any alcohol in over 3 years, however she admitted to drinking the day prior to her endoscopy. States that when she had drunk recently it was only 1 or 2 drinks. However, after discussion after her endoscopy about life expectancy if she continues to drink she states that scared her and she has not had any alcohol in over a month. 4 columns of large varices were banded and recommended repeat endoscopy with banding as needed in October 2016. We'll proceed with this. Her labs and imaging are up-to-date. Follow-up in February 2016 for routine care, labs, and imaging.  Proceed with EGD in the OR with propofol/MAC with Dr. Darrick Penna for variceal surveillance in near future: the risks, benefits, and alternatives have been discussed with the patient in detail. The patient states understanding and desires to proceed.

## 2015-01-03 NOTE — Assessment & Plan Note (Signed)
Hepatomegaly is noted on MRI. Imaging up-to-date, no focal lesions noted, repeat imaging in February 2017 at her next follow-up visit for routine care.

## 2015-01-04 ENCOUNTER — Ambulatory Visit: Payer: No Typology Code available for payment source | Admitting: Family Medicine

## 2015-01-04 NOTE — Progress Notes (Signed)
CC'ED TO PCP 

## 2015-01-17 ENCOUNTER — Encounter: Payer: Self-pay | Admitting: Family Medicine

## 2015-01-17 ENCOUNTER — Ambulatory Visit (INDEPENDENT_AMBULATORY_CARE_PROVIDER_SITE_OTHER): Payer: Self-pay | Admitting: Family Medicine

## 2015-01-17 VITALS — BP 137/76 | HR 81 | Ht 69.0 in | Wt 260.0 lb

## 2015-01-17 DIAGNOSIS — M722 Plantar fascial fibromatosis: Secondary | ICD-10-CM

## 2015-01-17 NOTE — Patient Instructions (Signed)
You are improving from your plantar fasciitis. Call me if you want to try physical therapy, want a prescription for night splints, or want to do an MRI (unlikely to show anything different however). Take tylenol or aleve as needed for pain  Plantar fascia stretch for 20-30 seconds (do 3 of these) in morning Lowering/raise on a step exercises 3 x 10 once or twice a day - this is very important for long term recovery. Can add heel walks, toe walks forward and backward as well Ice heel for 15 minutes as needed. Avoid flat shoes/barefoot walking as much as possible. Arch straps have been shown to help with pain. Orthotics with heel lift may be helpful. Physical therapy is also an option. Follow up with me as needed otherwise.

## 2015-01-17 NOTE — Progress Notes (Signed)
PCP: Lora Paula, MD  Subjective:   HPI: Patient is a 44 y.o. female here for right heel pain.  8/15: Patient denies known injury. She reports over 5 weeks of plantar right heel pain. 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 Worse by end of her shifts. No radiation. Tried ice bottle.  10/3: Patient reports pain is now 0/10 at rest but up to 6/10 with ambulation. Down from 10/10 last visit. Injection did not help. Very sharp pain. Located plantar heel. Doing home exercises, using a massager, hot water bath. Is on medical leave from her job now. Swelling throughout right leg - nothing focal.  No fever.  Past Medical History  Diagnosis Date  . Migraine headache     At age 86  . Hypertension Dx 2003  . Anxiety Dx 2002  . Seizures (HCC) 2014 and 2009  . Portal hypertension (HCC)   . Ascites     Current Outpatient Prescriptions on File Prior to Visit  Medication Sig Dispense Refill  . acetaminophen-codeine (TYLENOL #3) 300-30 MG per tablet Take 1 tablet by mouth daily as needed for severe pain. 30 tablet 0  . ALPRAZolam (XANAX) 1 MG tablet Take 1 mg by mouth at bedtime as needed for anxiety.    . ciprofloxacin (CIPRO) 500 MG tablet 1 po bid for 7 days (Patient not taking: Reported on 01/03/2015) 14 tablet 0  . cyclobenzaprine (FLEXERIL) 10 MG tablet Take 1 tablet (10 mg total) by mouth 2 (two) times daily as needed for muscle spasms (caution will cause drowsiness). For headache per patient 45 tablet 1  . Elastic Bandages & Supports (MEDICAL COMPRESSION STOCKINGS) MISC 1 each by Does not apply route daily. 20 mmHg pressure 2 each 1  . famotidine (PEPCID) 20 MG tablet Take 1 tablet (20 mg total) by mouth 2 (two) times daily. 30 tablet 0  . furosemide (LASIX) 40 MG tablet Take 1 tablet (40 mg total) by mouth 2 (two) times daily. 60 tablet 3  . Lactulose 20 GM/30ML SOLN Take 30 mLs (20 g total) by mouth 3 (three) times daily. 946 mL 0  . spironolactone (ALDACTONE)  100 MG tablet Take 1 tablet (100 mg total) by mouth daily. 30 tablet 11  . traMADol (ULTRAM) 50 MG tablet Take 1 tablet (50 mg total) by mouth every 8 (eight) hours as needed. 60 tablet 2  . zolpidem (AMBIEN) 10 MG tablet Take 10 mg by mouth at bedtime as needed for sleep.     No current facility-administered medications on file prior to visit.    Past Surgical History  Procedure Laterality Date  . Colonoscopy  2015  . Vein ligation and stripping N/A 07/14/2014    Procedure: EXCISION OF VARICOSE VEINS AT UMBILICUS;  Surgeon: Larina Earthly, MD;  Location: [facility] OR;  Service: Vascular;  Laterality: N/A;  . Umbilical hernia repair N/A 07/14/2014    Procedure: HERNIA REPAIR UMBILICAL ADULT;  Surgeon: Larina Earthly, MD;  Location: [facility] OR;  Service: Vascular;  Laterality: N/A;  . Esophagogastroduodenoscopy (egd) with propofol N/A 12/07/2014    [id number] portal hypertensive gastropathy/grade 3 varices  . Esophageal banding N/A 12/07/2014    Procedure: ESOPHAGEAL BANDING (4 bands);  Surgeon: West Bali, MD;  Location: [facility] OR[facility];  Service: Endoscopy;  Laterality: N/A;    No Known Allergies  Social History   Social History  . Marital Status: Legally Separated    Spouse Name: N/A  . Number of Children: N/A  .  Years of Education: N/A   Occupational History  . Not on file.   Social History Main Topics  . Smoking status: Former Smoker -- 20 years    Types: Cigarettes    Quit date: 08/02/2014  . Smokeless tobacco: Never Used     Comment: cut back to 4-5 cigarettes per day  . Alcohol Use: 0.0 oz/week    0 Standard drinks or equivalent per week     Comment: Has not had ETOH in 09/08/14  . Drug Use: No  . Sexual Activity: Yes    Birth Control/ Protection: IUD   Other Topics Concern  . Not on file   Social History Narrative    Family History  Problem Relation Age of Onset  . Hypertension Mother   . Cancer Mother   . Diabetes Mother   . Colon cancer Neg Hx   . Liver disease      BP  137/76 mmHg  Pulse 81  Ht  (1.753 m)  Wt 260 lb (117.935 kg)  BMI 38.38 kg/m2  Review of Systems: See HPI above.    Objective:  Physical Exam:  Gen: NAD  Right foot/ankle: 2+ pitting edema throughout.  No other deformity.  No bruising. Mild overpronation FROM ankle with 5/5 strength all directions. No TTP insertion of plantar fascia on medial calcaneus.  No other tenderness. Negative ant drawer and talar tilt.   Negative syndesmotic compression. Negative calcaneal squeeze. Thompsons test negative. NV intact distally.  Left foot/ankle: FROM without pain, swelling.    Assessment & Plan:  1. Right plantar fasciitis - clinically improved compared to last visit.  Encouraged to continue home exercises, arch binders.  Discussed PT, night splints, further imaging - she would like to wait on these measures.  Encouraged she use the sports insoles with scaphoid pads we provided or shoes with good arch support (has flat sandals on currently).  F/u prn otherwise.

## 2015-01-17 NOTE — Assessment & Plan Note (Signed)
clinically improved compared to last visit.  Encouraged to continue home exercises, arch binders.  Discussed PT, night splints, further imaging - she would like to wait on these measures.  Encouraged she use the sports insoles with scaphoid pads we provided or shoes with good arch support (has flat sandals on currently).  F/u prn otherwise.

## 2015-01-20 ENCOUNTER — Ambulatory Visit: Payer: Medicaid Other | Attending: Family Medicine | Admitting: Family Medicine

## 2015-01-20 ENCOUNTER — Encounter: Payer: Self-pay | Admitting: Family Medicine

## 2015-01-20 VITALS — BP 135/75 | HR 76 | Temp 97.9°F | Resp 16 | Ht 69.0 in | Wt 244.0 lb

## 2015-01-20 DIAGNOSIS — R600 Localized edema: Secondary | ICD-10-CM | POA: Diagnosis not present

## 2015-01-20 DIAGNOSIS — R1030 Lower abdominal pain, unspecified: Secondary | ICD-10-CM

## 2015-01-20 DIAGNOSIS — K766 Portal hypertension: Secondary | ICD-10-CM

## 2015-01-20 DIAGNOSIS — K7581 Nonalcoholic steatohepatitis (NASH): Secondary | ICD-10-CM | POA: Diagnosis not present

## 2015-01-20 DIAGNOSIS — E7220 Disorder of urea cycle metabolism, unspecified: Secondary | ICD-10-CM

## 2015-01-20 DIAGNOSIS — R4182 Altered mental status, unspecified: Secondary | ICD-10-CM | POA: Diagnosis not present

## 2015-01-20 DIAGNOSIS — Z79899 Other long term (current) drug therapy: Secondary | ICD-10-CM | POA: Diagnosis not present

## 2015-01-20 DIAGNOSIS — K769 Liver disease, unspecified: Secondary | ICD-10-CM

## 2015-01-20 LAB — COMPLETE METABOLIC PANEL WITH GFR
ALBUMIN: 2.8 g/dL — AB (ref 3.6–5.1)
ALK PHOS: 56 U/L (ref 33–115)
ALT: 33 U/L — ABNORMAL HIGH (ref 6–29)
AST: 57 U/L — AB (ref 10–30)
BUN: 16 mg/dL (ref 7–25)
CALCIUM: 8.8 mg/dL (ref 8.6–10.2)
CO2: 24 mmol/L (ref 20–31)
Chloride: 99 mmol/L (ref 98–110)
Creat: 1.69 mg/dL — ABNORMAL HIGH (ref 0.50–1.10)
GFR, Est African American: 42 mL/min — ABNORMAL LOW (ref >=60)
GFR, Est Non African American: 36 mL/min — ABNORMAL LOW (ref >=60)
Glucose, Bld: 86 mg/dL (ref 65–99)
POTASSIUM: 4.3 mmol/L (ref 3.5–5.3)
Sodium: 134 mmol/L — ABNORMAL LOW (ref 135–146)
Total Bilirubin: 5.5 mg/dL — ABNORMAL HIGH (ref 0.2–1.2)
Total Protein: 7.8 g/dL (ref 6.1–8.1)

## 2015-01-20 MED ORDER — FUROSEMIDE 40 MG PO TABS: 40.0000 mg | ORAL_TABLET | Freq: Two times a day (BID) | ORAL | Status: DC

## 2015-01-20 MED ORDER — TRAMADOL HCL 50 MG PO TABS: 50.0000 mg | ORAL_TABLET | Freq: Three times a day (TID) | ORAL | Status: DC | PRN

## 2015-01-20 MED ORDER — SPIRONOLACTONE 100 MG PO TABS: 100.0000 mg | ORAL_TABLET | Freq: Every day | ORAL | Status: DC

## 2015-01-20 MED ORDER — ACETAMINOPHEN-CODEINE #3 300-30 MG PO TABS: 1.0000 | ORAL_TABLET | Freq: Two times a day (BID) | ORAL | Status: DC

## 2015-01-20 NOTE — Patient Instructions (Addendum)
Armandina was seen today for hepatic disease, sore, letter for school/work and altered mental status.  Diagnoses and all orders for this visit:  NASH (nonalcoholic steatohepatitis) -     furosemide (LASIX) 40 MG tablet; Take 1 tablet (40 mg total) by mouth 2 (two) times daily. -     spironolactone (ALDACTONE) 100 MG tablet; Take 1 tablet (100 mg total) by mouth daily.  Liver disease -     COMPLETE METABOLIC PANEL WITH GFR -     Ammonia  Lower abdominal pain -     traMADol (ULTRAM) 50 MG tablet; Take 1 tablet (50 mg total) by mouth every 8 (eight) hours as needed. -     acetaminophen-codeine (TYLENOL #3) 300-30 MG tablet; Take 1 tablet by mouth 2 (two) times daily.  Bilateral lower extremity edema -     furosemide (LASIX) 40 MG tablet; Take 1 tablet (40 mg total) by mouth 2 (two) times daily. -     spironolactone (ALDACTONE) 100 MG tablet; Take 1 tablet (100 mg total) by mouth daily.  Portal hypertension (HCC) -     spironolactone (ALDACTONE) 100 MG tablet; Take 1 tablet (100 mg total) by mouth daily.   F/u in 1 week for Unna boot removal and wound check with me   Dr. Armen Pickup

## 2015-01-20 NOTE — Progress Notes (Signed)
Requesting letter for provetion office stating unable to work due to illness. Stated unable to think clearly  Ulcer on rt lower leg, No pain.  No hx tobacco

## 2015-01-20 NOTE — Progress Notes (Signed)
Patient ID: Julie Rowland, female   DOB: June 28, 1970, 44 y.o.   MRN: [id number]   Subjective:  Patient ID: Julie Rowland, female    DOB: Aug 07, 1970  Age: 44 y.o. MRN: [id number]  CC: Hepatic Disease; Sore; Letter for School/Work; and Altered Mental Status   HPI [name] presents for    1. NASH/alcoholic liver disease: patient has been seen by GI. She has f/u EGD in 3 weeks. She reports confusion, leg swelling. Lower abdominal pain, muscle and joint aches. She is unable to work for the past 6 months. She is currently living with her mother.   2. Letter: patient is on probation. She needs a letter for her parole officer stating that she is unable to work.   3. Leg sore: for past 2 weeks. No itching. Mildly tender. No trauma. Has worsening venous stasis on the R compared to the left.   Outpatient Prescriptions Prior to Visit  Medication Sig Dispense Refill  . acetaminophen-codeine (TYLENOL #3) 300-30 MG per tablet Take 1 tablet by mouth daily as needed for severe pain. 30 tablet 0  . ALPRAZolam (XANAX) 1 MG tablet Take 1 mg by mouth at bedtime as needed for anxiety.    . cyclobenzaprine (FLEXERIL) 10 MG tablet Take 1 tablet (10 mg total) by mouth 2 (two) times daily as needed for muscle spasms (caution will cause drowsiness). For headache per patient 45 tablet 1  . furosemide (LASIX) 40 MG tablet Take 1 tablet (40 mg total) by mouth 2 (two) times daily. 60 tablet 3  . spironolactone (ALDACTONE) 100 MG tablet Take 1 tablet (100 mg total) by mouth daily. 30 tablet 11  . zolpidem (AMBIEN) 10 MG tablet Take 10 mg by mouth at bedtime as needed for sleep.    . ciprofloxacin (CIPRO) 500 MG tablet 1 po bid for 7 days (Patient not taking: Reported on 01/03/2015) 14 tablet 0  . Elastic Bandages & Supports (MEDICAL COMPRESSION STOCKINGS) MISC 1 each by Does not apply route daily. 20 mmHg pressure (Patient not taking: Reported on 01/20/2015) 2 each 1  . famotidine (PEPCID) 20 MG  tablet Take 1 tablet (20 mg total) by mouth 2 (two) times daily. (Patient not taking: Reported on 01/20/2015) 30 tablet 0  . Lactulose 20 GM/30ML SOLN Take 30 mLs (20 g total) by mouth 3 (three) times daily. 946 mL 0  . traMADol (ULTRAM) 50 MG tablet Take 1 tablet (50 mg total) by mouth every 8 (eight) hours as needed. (Patient not taking: Reported on 01/20/2015) 60 tablet 2   No facility-administered medications prior to visit.    ROS Review of Systems  Constitutional: Negative for fever and chills.  Eyes: Negative for visual disturbance.  Respiratory: Negative for shortness of breath.   Cardiovascular: Positive for leg swelling. Negative for chest pain.  Gastrointestinal: Negative for nausea, vomiting, abdominal pain, diarrhea, constipation, blood in stool, anal bleeding and rectal pain.  Musculoskeletal: Positive for arthralgias. Negative for back pain.  Skin: Negative for rash.  Allergic/Immunologic: Negative for immunocompromised state.  Hematological: Negative for adenopathy. Does not bruise/bleed easily.  Psychiatric/Behavioral: Positive for confusion and dysphoric mood. Negative for suicidal ideas.    Objective:  BP 135/75 mmHg  Pulse 76  Temp(Src) 97.9 F (36.6 C) (Oral)  Resp 16  Ht  (1.753 m)  Wt 244 lb (110.678 kg)  BMI 36.02 kg/m2  SpO2 100%  Wt Readings from Last 3 Encounters:  01/20/15 244 lb (110.678 kg)  01/17/15 260 lb (  117.935 kg)  11/29/14 250 lb (113.399 kg)    BP/Weight 01/20/2015 01/17/2015 01/03/2015  Systolic BP 135 137 146  Diastolic BP 75 76 79  Wt. (Lbs) 244 260 -  BMI 36.02 38.38 -   Physical Exam  Constitutional: She is oriented to person, place, and time. She appears well-developed and well-nourished. No distress.  Morbidly obese   HENT:  Head: Normocephalic and atraumatic.  Eyes: EOM are normal. Pupils are equal, round, and reactive to light. Scleral icterus is present.  Cardiovascular: Normal rate, regular rhythm, normal heart sounds  and intact distal pulses.   Pulmonary/Chest: Effort normal and breath sounds normal.  Abdominal: Soft. Bowel sounds are normal. She exhibits distension. She exhibits no mass. There is no tenderness. There is no rebound and no guarding.  Musculoskeletal: Normal range of motion. She exhibits edema. Tenderness: foot tenderness R plantar   Neurological: She is alert and oriented to person, place, and time.  Skin: Skin is warm and dry. No rash noted.     Psychiatric: She has a normal mood and affect.   Unna boot applied to R leg   Assessment & Plan:   Problem List Items Addressed This Visit    Abdominal pain   Relevant Medications   traMADol (ULTRAM) 50 MG tablet   acetaminophen-codeine (TYLENOL #3) 300-30 MG tablet   NASH (nonalcoholic steatohepatitis) - Primary (Chronic)   Relevant Medications   furosemide (LASIX) 40 MG tablet   spironolactone (ALDACTONE) 100 MG tablet   Portal hypertension (HCC) (Chronic)   Relevant Medications   furosemide (LASIX) 40 MG tablet   spironolactone (ALDACTONE) 100 MG tablet    Other Visit Diagnoses    Bilateral lower extremity edema        Relevant Medications    furosemide (LASIX) 40 MG tablet    spironolactone (ALDACTONE) 100 MG tablet       No orders of the defined types were placed in this encounter.    Follow-up: No Follow-up on file.   Dessa Phi MD

## 2015-01-21 LAB — AMMONIA: AMMONIA: 84 umol/L — AB (ref 16–53)

## 2015-01-21 MED ORDER — LACTULOSE 20 GM/30ML PO SOLN: 30.0000 mL | Freq: Three times a day (TID) | ORAL | Status: DC

## 2015-01-21 NOTE — Addendum Note (Signed)
Addended by: Dessa Phi on: 01/21/2015 09:53 AM   Modules accepted: Orders

## 2015-01-24 ENCOUNTER — Other Ambulatory Visit: Payer: Self-pay

## 2015-01-28 ENCOUNTER — Ambulatory Visit: Payer: No Typology Code available for payment source | Attending: Family Medicine | Admitting: Family Medicine

## 2015-01-28 ENCOUNTER — Encounter: Payer: Self-pay | Admitting: Family Medicine

## 2015-01-28 VITALS — BP 139/72 | HR 85 | Temp 98.1°F | Resp 18 | Wt 239.2 lb

## 2015-01-28 DIAGNOSIS — E7220 Disorder of urea cycle metabolism, unspecified: Secondary | ICD-10-CM

## 2015-01-28 DIAGNOSIS — K7581 Nonalcoholic steatohepatitis (NASH): Secondary | ICD-10-CM

## 2015-01-28 DIAGNOSIS — I878 Other specified disorders of veins: Secondary | ICD-10-CM

## 2015-01-28 NOTE — Assessment & Plan Note (Signed)
A; improved leg swelling with compression resulting in wound healing  P:  Advised use of compression stockings

## 2015-01-28 NOTE — Patient Instructions (Addendum)
Julie Rowland,  You are looking great. Down 5 lbs Wound is healing Compression is what Wear the stocking on the R side   Lactulose to keep the ammonia level down   F/u in 4 weeks for labs F/u with me in 3 months   Dr. Armen PickupFunches

## 2015-01-28 NOTE — Progress Notes (Signed)
Subjective:  Patient ID: Julie Rowland, female    DOB: 01-16-1971  Age: 44 y.o. MRN: [id number]  CC: Wound Check   HPI Julie Rowland presents for   1. R shin wound: wore Unna boot for 3 days. Swelling improved. Wound is healing. Feeling better overall. Has compression stockings at home.   Social History  Substance Use Topics  . Smoking status: Former Smoker -- 20 years    Types: Cigarettes    Quit date: 08/02/2014  . Smokeless tobacco: Never Used     Comment: cut back to 4-5 cigarettes per day  . Alcohol Use: 0.0 oz/week    0 Standard drinks or equivalent per week     Comment: Has not had ETOH in 09/08/14    Outpatient Prescriptions Prior to Visit  Medication Sig Dispense Refill  . acetaminophen-codeine (TYLENOL #3) 300-30 MG tablet Take 1 tablet by mouth 2 (two) times daily. 60 tablet 2  . ALPRAZolam (XANAX) 1 MG tablet Take 1 mg by mouth at bedtime as needed for anxiety.    . Ascorbic Acid (VITAMIN C PO) Take 1 tablet by mouth daily.    . cyclobenzaprine (FLEXERIL) 10 MG tablet Take 1 tablet (10 mg total) by mouth 2 (two) times daily as needed for muscle spasms (caution will cause drowsiness). For headache per patient 45 tablet 1  . Elastic Bandages & Supports (MEDICAL COMPRESSION STOCKINGS) MISC 1 each by Does not apply route daily. 20 mmHg pressure (Patient not taking: Reported on 01/20/2015) 2 each 1  . furosemide (LASIX) 40 MG tablet Take 1 tablet (40 mg total) by mouth 2 (two) times daily. 60 tablet 3  . Lactulose 20 GM/30ML SOLN Take 30 mLs (20 g total) by mouth 3 (three) times daily. 946 mL 1  . spironolactone (ALDACTONE) 100 MG tablet Take 1 tablet (100 mg total) by mouth daily. 30 tablet 11  . traMADol (ULTRAM) 50 MG tablet Take 1 tablet (50 mg total) by mouth every 8 (eight) hours as needed. 60 tablet 2  . zolpidem (AMBIEN) 10 MG tablet Take 10 mg by mouth at bedtime as needed for sleep.     No facility-administered medications prior to visit.     ROS Review of Systems  Constitutional: Positive for fatigue. Negative for fever and chills.  Eyes: Negative for visual disturbance.  Respiratory: Negative for shortness of breath.   Cardiovascular: Positive for leg swelling. Negative for chest pain.  Gastrointestinal: Negative for nausea, vomiting, abdominal pain, diarrhea, constipation, blood in stool, anal bleeding and rectal pain.  Musculoskeletal: Negative for back pain and arthralgias.  Skin: Negative for rash.  Allergic/Immunologic: Negative for immunocompromised state.  Hematological: Negative for adenopathy. Does not bruise/bleed easily.  Psychiatric/Behavioral: Positive for confusion. Negative for suicidal ideas and dysphoric mood.    Objective:  BP 139/72 mmHg  Pulse 85  Temp(Src) 98.1 F (36.7 C) (Oral)  Resp 18  Wt 239 lb 3.2 oz (108.5 kg)  SpO2 100%  BP/Weight 01/28/2015 01/20/2015 01/17/2015  Systolic BP 139 135 137  Diastolic BP 72 75 76  Wt. (Lbs) 239.2 244 260  BMI 35.31 36.02 38.38   Physical Exam  Constitutional: She is oriented to person, place, and time. She appears well-developed and well-nourished. No distress.  Morbidly obese   HENT:  Head: Normocephalic and atraumatic.  Eyes: EOM are normal. Pupils are equal, round, and reactive to light. Scleral icterus is present.  Cardiovascular: Normal rate, regular rhythm, normal heart sounds and intact distal pulses.  Pulmonary/Chest: Effort normal and breath sounds normal.  Abdominal: Soft. Bowel sounds are normal. She exhibits distension. She exhibits no mass. There is no tenderness. There is no rebound and no guarding.  Musculoskeletal: Normal range of motion. She exhibits edema. Tenderness: foot tenderness R plantar   Neurological: She is alert and oriented to person, place, and time.  Skin: Skin is warm and dry. No rash noted.     Psychiatric: She has a normal mood and affect.     Assessment & Plan:   Problem List Items Addressed This Visit     Hyperammonemia (HCC) - Primary   Relevant Orders   Ammonia   NASH (nonalcoholic steatohepatitis) (Chronic)   Relevant Orders   COMPLETE METABOLIC PANEL WITH GFR      No orders of the defined types were placed in this encounter.    Follow-up: No Follow-up on file.   Dessa Phi MD

## 2015-01-31 ENCOUNTER — Telehealth: Payer: Self-pay

## 2015-01-31 ENCOUNTER — Telehealth: Payer: Self-pay | Admitting: Family Medicine

## 2015-01-31 NOTE — Telephone Encounter (Signed)
Pt. Called requesting for PCP to fill out a handicap paperwork. PCP signed the paperwork but the PCP checked the box for permanently and six months. Patient dropped off the paperwork again. Please f/u with pt.

## 2015-01-31 NOTE — Telephone Encounter (Signed)
Spoke with pt last week (10/12) to confirm her procedure date and arrival time. Pt denies any changes to H&P. Medication  reviewed.

## 2015-01-31 NOTE — Telephone Encounter (Signed)
REVIEWED-NO ADDITIONAL RECOMMENDATIONS. 

## 2015-02-02 NOTE — Patient Instructions (Signed)
Julie Rowland  02/02/2015     @   Your procedure is scheduled on  02/08/2015   Report to [facility] at  900  A.M.  Call this number if you have problems the morning of surgery:  [phone]   Remember:  Do not eat food or drink liquids after midnight.  Take these medicines the morning of surgery with A SIP OF WATER  Tylenol #3, xanax, flexaril, ultram.   Do not wear jewelry, make-up or nail polish.  Do not wear lotions, powders, or perfumes.  You may wear deodorant.  Do not shave 48 hours prior to surgery.  Men may shave face and neck.  Do not bring valuables to the hospital.  [facility] is not responsible for any belongings or valuables.  Contacts, dentures or bridgework may not be worn into surgery.  Leave your suitcase in the car.  After surgery it may be brought to your room.  For patients admitted to the hospital, discharge time will be determined by your treatment team.  Patients discharged the day of surgery will not be allowed to drive home.   Name and phone number of your driver:   family Special instructions:  Follow the diet instructions given to you by dr Evelina Dun office  Please read over the following fact sheets that you were given. Pain Booklet, Coughing and Deep Breathing, Surgical Site Infection Prevention, Anesthesia Post-op Instructions and Care and Recovery After Surgery      Esophagogastroduodenoscopy Esophagogastroduodenoscopy (EGD) is a procedure that is used to examine the lining of the esophagus, stomach, and first part of the small intestine (duodenum). A long, flexible, lighted tube with a camera attached (endoscope) is inserted down the throat to view these organs. This procedure is done to detect problems or abnormalities, such as inflammation, bleeding, ulcers, or growths, in order to treat them. The procedure lasts 5-20 minutes. It is usually an outpatient procedure, but it may need to be performed in a  hospital in emergency cases. LET [facility] CARE PROVIDER KNOW ABOUT:  Any allergies you have.  All medicines you are taking, including vitamins, herbs, eye drops, creams, and over-the-counter medicines.  Previous problems you or members of your family have had with the use of anesthetics.  Any blood disorders you have.  Previous surgeries you have had.  Medical conditions you have. RISKS AND COMPLICATIONS Generally, this is a safe procedure. However, problems can occur and include:  Infection.  Bleeding.  Tearing (perforation) of the esophagus, stomach, or duodenum.  Difficulty breathing or not being able to breathe.  Excessive sweating.  Spasms of the larynx.  Slowed heartbeat.  Low blood pressure. BEFORE THE PROCEDURE  Do not eat or drink anything after midnight on the night before the procedure or as directed by your health care provider.  Do not take your regular medicines before the procedure if your health care provider asks you not to. Ask your health care provider about changing or stopping those medicines.  If you wear dentures, be prepared to remove them before the procedure.  Arrange for someone to drive you home after the procedure. PROCEDURE  A numbing medicine (local anesthetic) may be sprayed in your throat for comfort and to stop you from gagging or coughing.  You will have an IV tube inserted in a vein in your hand or arm. You will receive medicines and fluids through this tube.  You will be given a medicine to  relax you (sedative).  A pain reliever will be given through the IV tube.  A mouth guard may be placed in your mouth to protect your teeth and to keep you from biting on the endoscope.  You will be asked to lie on your left side.  The endoscope will be inserted down your throat and into your esophagus, stomach, and duodenum.  Air will be put through the endoscope to allow your health care provider to clearly view the lining of your  esophagus.  The lining of your esophagus, stomach, and duodenum will be examined. During the exam, your health care provider may:  Remove tissue to be examined under a microscope (biopsy) for inflammation, infection, or other medical problems.  Remove growths.  Remove objects (foreign bodies) that are stuck.  Treat any bleeding with medicines or other devices that stop tissues from bleeding (hot cautery, clipping devices).  Widen (dilate) or stretch narrowed areas of your esophagus and stomach.  The endoscope will be withdrawn. AFTER THE PROCEDURE  You will be taken to a recovery area for observation. Your blood pressure, heart rate, breathing rate, and blood oxygen level will be monitored often until the medicines you were given have worn off.  Do not eat or drink anything until the numbing medicine has worn off and your gag reflex has returned. You may choke.  Your health care provider should be able to discuss his or her findings with you. It will take longer to discuss the test results if any biopsies were taken.   This information is not intended to replace advice given to you by your health care provider. Make sure you discuss any questions you have with your health care provider.   Document Released: 08/03/2004 Document Revised: 04/23/2014 Document Reviewed: 03/05/2012 Elsevier Interactive Patient Education 2016 [employer] Esophageal Varices The esophagus is the passage that connects the throat to the stomach. Esophageal varices are blood vessels in the esophagus that have become enlarged. They develop when extra blood is forced to flow through them because the blood's normal pathway is blocked. Without treatment these blood vessels eventually break and bleed (hemorrhage). A hemorrhage is life-threatening. CAUSES This condition may be caused by:  Scarring of the liver (cirrhosis) due to alcoholism. This is the most common cause.  Liver disease.  Severe heart  failure.  A blood clot in the portal vein.  Sarcoidosis. This is an inflammatory disease that can affect the liver.  Schistosomiasis. This is a parasitic infection that can cause liver damage. SYMPTOMS Usually there are no symptoms unless the esophageal varices bleed. Symptoms of bleeding esophageal varices include:  Vomiting material that is bright red or that is black and looks like coffee grounds.  Coughing up blood.  Black, tarry stools.  Dizziness or lightheadedness.  Low blood pressure.  Loss of consciousness. DIAGNOSIS This condition is diagnosed with tests, such as:  Endoscopy. During this test a thin, lighted tube is inserted through the mouth and into the esophagus.  Blood tests. These may be done to check liver function, blood counts, and the body's ability to form blood clots. TREATMENT This condition may be treated with:  Medicines that reduce pressure in the esophageal varices and reduce the risk of bleeding.  Procedures to reduce pressure in the esophageal varices and reduce the risk of bleeding or stop bleeding. These include:  Variceal ligation. In this procedure, a rubber band is placed around the esophageal varices to keep them from bleeding.  Injection therapy. This treatment involves  an injection that causes the esophageal varices to shrink and close (sclerotherapy). Medicines that tighten blood vessels or alter blood flow may also be used.  Balloon tamponade. In this procedure, a tube is put into the esophagus and a balloon is passed through it and inflated.  Transjugular intrahepatic portosystemic shunt (TIPS) placement. In this procedure, a small tube is placed within the liver veins. This decreases blood flow and pressure to the esophageal varices.  A liver transplant. This may be done if other treatments do not work. HOME CARE INSTRUCTIONS  Take medicines only as directed by your health care provider.  Follow your health care provider's  instructions about rest and physical activity. SEEK IMMEDIATE MEDICAL CARE IF:  You have any symptoms of this condition after treatment.  You are unable to eat or drink.  You have chest pain.   This information is not intended to replace advice given to you by your health care provider. Make sure you discuss any questions you have with your health care provider.   Document Released: 06/23/2003 Document Revised: 08/17/2014 Document Reviewed: 03/29/2014 Elsevier Interactive Patient Education 2016 [employer] PATIENT INSTRUCTIONS POST-ANESTHESIA  IMMEDIATELY FOLLOWING SURGERY:  Do not drive or operate machinery for the first twenty four hours after surgery.  Do not make any important decisions for twenty four hours after surgery or while taking narcotic pain medications or sedatives.  If you develop intractable nausea and vomiting or a severe headache please notify your doctor immediately.  FOLLOW-UP:  Please make an appointment with your surgeon as instructed. You do not need to follow up with anesthesia unless specifically instructed to do so.  WOUND CARE INSTRUCTIONS (if applicable):  Keep a dry clean dressing on the anesthesia/puncture wound site if there is drainage.  Once the wound has quit draining you may leave it open to air.  Generally you should leave the bandage intact for twenty four hours unless there is drainage.  If the epidural site drains for more than 36-48 hours please call the anesthesia department.  QUESTIONS?:  Please feel free to call your physician or the hospital operator if you have any questions, and they will be happy to assist you.

## 2015-02-03 ENCOUNTER — Encounter (HOSPITAL_COMMUNITY): Payer: Self-pay

## 2015-02-03 ENCOUNTER — Encounter (HOSPITAL_COMMUNITY)
Admission: RE | Admit: 2015-02-03 | Discharge: 2015-02-03 | Disposition: A | Discharge status: Home / Self Care | Payer: No Typology Code available for payment source | Source: Ambulatory Visit | Attending: Gastroenterology | Admitting: Gastroenterology

## 2015-02-03 DIAGNOSIS — Z01818 Encounter for other preprocedural examination: Secondary | ICD-10-CM | POA: Insufficient documentation

## 2015-02-03 LAB — CBC WITH DIFFERENTIAL/PLATELET
Basophils Absolute: 0 10*3/uL (ref 0.0–0.1)
Basophils Relative: 1 %
EOS ABS: 0.2 10*3/uL (ref 0.0–0.7)
EOS PCT: 3 %
HCT: 25.5 % — ABNORMAL LOW (ref 36.0–46.0)
HEMOGLOBIN: 8.9 g/dL — AB (ref 12.0–15.0)
LYMPHS ABS: 1.5 10*3/uL (ref 0.7–4.0)
LYMPHS PCT: 25 %
MCH: 35.9 pg — AB (ref 26.0–34.0)
MCHC: 34.9 g/dL (ref 30.0–36.0)
MCV: 102.8 fL — AB (ref 78.0–100.0)
MONOS PCT: 12 %
Monocytes Absolute: 0.7 10*3/uL (ref 0.1–1.0)
Neutro Abs: 3.5 10*3/uL (ref 1.7–7.7)
Neutrophils Relative %: 60 %
PLATELETS: 99 10*3/uL — AB (ref 150–400)
RBC: 2.48 MIL/uL — AB (ref 3.87–5.11)
RDW: 12.8 % (ref 11.5–15.5)
WBC: 5.8 10*3/uL (ref 4.0–10.5)

## 2015-02-03 LAB — BASIC METABOLIC PANEL
Anion gap: 11 (ref 5–15)
BUN: 21 mg/dL — AB (ref 6–20)
CHLORIDE: 102 mmol/L (ref 101–111)
CO2: 21 mmol/L — ABNORMAL LOW (ref 22–32)
CREATININE: 2.25 mg/dL — AB (ref 0.44–1.00)
Calcium: 9.3 mg/dL (ref 8.9–10.3)
GFR calc Af Amer: 29 mL/min — ABNORMAL LOW (ref >60)
GFR calc non Af Amer: 25 mL/min — ABNORMAL LOW (ref >60)
GLUCOSE: 102 mg/dL — AB (ref 65–99)
POTASSIUM: 3.6 mmol/L (ref 3.5–5.1)
SODIUM: 134 mmol/L — AB (ref 135–145)

## 2015-02-07 NOTE — Telephone Encounter (Signed)
Form done 

## 2015-02-08 ENCOUNTER — Ambulatory Visit (HOSPITAL_COMMUNITY): Payer: Medicaid Other | Admitting: Anesthesiology

## 2015-02-08 ENCOUNTER — Encounter (HOSPITAL_COMMUNITY): Payer: Self-pay | Admitting: *Deleted

## 2015-02-08 ENCOUNTER — Ambulatory Visit (HOSPITAL_COMMUNITY)
Admission: RE | Admit: 2015-02-08 | Discharge: 2015-02-08 | Disposition: A | Discharge status: Home / Self Care | Payer: Medicaid Other | Source: Ambulatory Visit | Attending: Gastroenterology | Admitting: Gastroenterology

## 2015-02-08 ENCOUNTER — Encounter (HOSPITAL_COMMUNITY)
Admission: RE | Disposition: A | Discharge status: Home / Self Care | Payer: Self-pay | Source: Ambulatory Visit | Attending: Gastroenterology

## 2015-02-08 DIAGNOSIS — I8500 Esophageal varices without bleeding: Secondary | ICD-10-CM | POA: Diagnosis not present

## 2015-02-08 DIAGNOSIS — F419 Anxiety disorder, unspecified: Secondary | ICD-10-CM | POA: Diagnosis not present

## 2015-02-08 DIAGNOSIS — I10 Essential (primary) hypertension: Secondary | ICD-10-CM | POA: Insufficient documentation

## 2015-02-08 DIAGNOSIS — Z79899 Other long term (current) drug therapy: Secondary | ICD-10-CM | POA: Diagnosis not present

## 2015-02-08 DIAGNOSIS — K766 Portal hypertension: Secondary | ICD-10-CM | POA: Insufficient documentation

## 2015-02-08 DIAGNOSIS — R600 Localized edema: Secondary | ICD-10-CM

## 2015-02-08 DIAGNOSIS — K7581 Nonalcoholic steatohepatitis (NASH): Secondary | ICD-10-CM

## 2015-02-08 DIAGNOSIS — Z87891 Personal history of nicotine dependence: Secondary | ICD-10-CM | POA: Diagnosis not present

## 2015-02-08 DIAGNOSIS — K3189 Other diseases of stomach and duodenum: Secondary | ICD-10-CM | POA: Diagnosis not present

## 2015-02-08 DIAGNOSIS — Z1389 Encounter for screening for other disorder: Secondary | ICD-10-CM | POA: Diagnosis present

## 2015-02-08 HISTORY — PX: GASTRIC VARICES BANDING: SHX5519

## 2015-02-08 HISTORY — PX: ESOPHAGOGASTRODUODENOSCOPY (EGD) WITH PROPOFOL: SHX5813

## 2015-02-08 LAB — HCG, SERUM, QUALITATIVE: Preg, Serum: NEGATIVE

## 2015-02-08 SURGERY — ESOPHAGOGASTRODUODENOSCOPY (EGD) WITH PROPOFOL
Anesthesia: Monitor Anesthesia Care

## 2015-02-08 MED ORDER — ONDANSETRON HCL 4 MG/2ML IJ SOLN
INTRAMUSCULAR | Status: AC
  Filled 2015-02-08: qty 2

## 2015-02-08 MED ORDER — LIDOCAINE VISCOUS 2 % MT SOLN
OROMUCOSAL | Status: AC
  Filled 2015-02-08: qty 15

## 2015-02-08 MED ORDER — FENTANYL CITRATE (PF) 100 MCG/2ML IJ SOLN
25.0000 ug | INTRAMUSCULAR | Status: DC | PRN
  Administered 2015-02-08: 50 ug via INTRAVENOUS
  Filled 2015-02-08: qty 2

## 2015-02-08 MED ORDER — MIDAZOLAM HCL 2 MG/2ML IJ SOLN
INTRAMUSCULAR | Status: AC
  Filled 2015-02-08: qty 4

## 2015-02-08 MED ORDER — LIDOCAINE VISCOUS 2 % MT SOLN
3.0000 mL | Freq: Once | OROMUCOSAL | Status: AC
  Administered 2015-02-08: 3 mL via OROMUCOSAL

## 2015-02-08 MED ORDER — PROPOFOL 10 MG/ML IV BOLUS
INTRAVENOUS | Status: AC
  Filled 2015-02-08: qty 20

## 2015-02-08 MED ORDER — CIPROFLOXACIN HCL 250 MG PO TABS: ORAL_TABLET | ORAL | Status: DC

## 2015-02-08 MED ORDER — FENTANYL CITRATE (PF) 100 MCG/2ML IJ SOLN
INTRAMUSCULAR | Status: AC
  Filled 2015-02-08: qty 2

## 2015-02-08 MED ORDER — CIPROFLOXACIN IN D5W 400 MG/200ML IV SOLN
INTRAVENOUS | Status: AC
  Filled 2015-02-08: qty 200

## 2015-02-08 MED ORDER — ONDANSETRON HCL 4 MG/2ML IJ SOLN
4.0000 mg | Freq: Once | INTRAMUSCULAR | Status: AC
  Administered 2015-02-08: 4 mg via INTRAVENOUS

## 2015-02-08 MED ORDER — PROPOFOL 500 MG/50ML IV EMUL
INTRAVENOUS | Status: DC | PRN
  Administered 2015-02-08: 11:00:00 via INTRAVENOUS
  Administered 2015-02-08: 125 ug/kg/min via INTRAVENOUS

## 2015-02-08 MED ORDER — ONDANSETRON HCL 4 MG/2ML IJ SOLN: 4.0000 mg | Freq: Once | INTRAMUSCULAR | Status: DC | PRN

## 2015-02-08 MED ORDER — MIDAZOLAM HCL 2 MG/2ML IJ SOLN
INTRAMUSCULAR | Status: AC
  Filled 2015-02-08: qty 2

## 2015-02-08 MED ORDER — LACTATED RINGERS IV SOLN
INTRAVENOUS | Status: DC
  Administered 2015-02-08: 10:00:00 via INTRAVENOUS

## 2015-02-08 MED ORDER — FENTANYL CITRATE (PF) 100 MCG/2ML IJ SOLN
25.0000 ug | INTRAMUSCULAR | Status: AC
Start: 2015-02-08 — End: 2015-02-08
  Administered 2015-02-08 (×2): 25 ug via INTRAVENOUS

## 2015-02-08 MED ORDER — FUROSEMIDE 40 MG PO TABS: ORAL_TABLET | ORAL | Status: DC

## 2015-02-08 MED ORDER — CIPROFLOXACIN HCL 500 MG PO TABS: ORAL_TABLET | ORAL | Status: DC

## 2015-02-08 MED ORDER — CIPROFLOXACIN IN D5W 400 MG/200ML IV SOLN
INTRAVENOUS | Status: DC | PRN
  Administered 2015-02-08: 400 mg via INTRAVENOUS

## 2015-02-08 MED ORDER — STERILE WATER FOR IRRIGATION IR SOLN
Status: DC | PRN
  Administered 2015-02-08: 1000 mL

## 2015-02-08 MED ORDER — MIDAZOLAM HCL 2 MG/2ML IJ SOLN
1.0000 mg | INTRAMUSCULAR | Status: DC | PRN
  Administered 2015-02-08 (×2): 2 mg via INTRAVENOUS
  Filled 2015-02-08: qty 2

## 2015-02-08 MED ORDER — PROPOFOL 10 MG/ML IV BOLUS
INTRAVENOUS | Status: AC
Start: 2015-02-08 — End: 2015-02-08
  Filled 2015-02-08: qty 20

## 2015-02-08 SURGICAL SUPPLY — 20 items
BAND LIGATOR SUPER 7 2.8 (MISCELLANEOUS) ×2 IMPLANT
BLOCK BITE 60FR ADLT L/F BLUE (MISCELLANEOUS) ×2 IMPLANT
ELECT REM PT RETURN 9FT ADLT (ELECTROSURGICAL)
ELECTRODE REM PT RTRN 9FT ADLT (ELECTROSURGICAL) IMPLANT
FLOOR PAD 36X40 (MISCELLANEOUS) ×3
FORCEPS BIOP RAD 4 LRG CAP 4 (CUTTING FORCEPS) IMPLANT
FORMALIN 10 PREFIL 20ML (MISCELLANEOUS) IMPLANT
KIT ENDO PROCEDURE PEN (KITS) ×3 IMPLANT
MANIFOLD NEPTUNE II (INSTRUMENTS) ×3 IMPLANT
NDL SCLEROTHERAPY 25GX240 (NEEDLE) IMPLANT
NEEDLE SCLEROTHERAPY 25GX240 (NEEDLE) IMPLANT
PAD FLOOR 36X40 (MISCELLANEOUS) IMPLANT
PROBE APC STR FIRE (PROBE) IMPLANT
PROBE INJECTION GOLD (MISCELLANEOUS)
PROBE INJECTION GOLD 7FR (MISCELLANEOUS) IMPLANT
SNARE SHORT THROW 13M SML OVAL (MISCELLANEOUS) IMPLANT
SYR 50ML LL SCALE MARK (SYRINGE) ×2 IMPLANT
SYR INFLATION 60ML (SYRINGE) IMPLANT
TUBING IRRIGATION ENDOGATOR (MISCELLANEOUS) ×2 IMPLANT
WATER STERILE IRR 1000ML POUR (IV SOLUTION) ×2 IMPLANT

## 2015-02-08 NOTE — H&P (Signed)
Primary Care Physician:  Lora PaulaFUNCHES, JOSALYN C, MD Primary Gastroenterologist:  Dr. [name]  Pre-Procedure History & Physical: HPI:  Julie Rowland is a 44 y.o. female here for SCREENING FOR VARICES.  Past Medical History  Diagnosis Date  . Migraine headache     At age 44  . Hypertension Dx 2003  . Anxiety Dx 2002  . Seizures (HCC) 2014 and 2009  . Portal hypertension (HCC)   . Ascites     Past Surgical History  Procedure Laterality Date  . Colonoscopy  2015  . Vein ligation and stripping N/A 07/14/2014    Procedure: EXCISION OF VARICOSE VEINS AT UMBILICUS;  Surgeon: Larina Earthlyodd F Early, MD;  [facility];  Service: Vascular;  Laterality: N/A;  . Umbilical hernia repair N/A 07/14/2014    Procedure: HERNIA REPAIR UMBILICAL ADULT;  Surgeon: Larina Earthlyodd F Early, MD;  [facility];  Service: Vascular;  Laterality: N/A;  . Esophagogastroduodenoscopy (egd) with propofol N/A 12/07/2014    JXB:[id number]:moderate portal hypertensive gastropathy/grade 3 varices  . Esophageal banding N/A 12/07/2014    Procedure: ESOPHAGEAL BANDING (4 bands);  Surgeon: West BaliSandi L Fields, MD;  Location: [facility]S;  Service: Endoscopy;  Laterality: N/A;    Prior to Admission medications   Medication Sig Start Date End Date Taking? Authorizing Provider  acetaminophen-codeine (TYLENOL #3) 300-30 MG tablet Take 1 tablet by mouth 2 (two) times daily. 01/20/15  Yes [name], MD  ALPRAZolam (XANAX) 1 MG tablet Take 1 mg by mouth at bedtime as needed for anxiety.   Yes Historical Provider, MD  Ascorbic Acid (VITAMIN C PO) Take 1 tablet by mouth daily.   Yes Historical Provider, MD  cyclobenzaprine (FLEXERIL) 10 MG tablet Take 1 tablet (10 mg total) by mouth 2 (two) times daily as needed for muscle spasms (caution will cause drowsiness). For headache per patient 10/09/12  Yes Clanford Cyndie MullL Johnson, MD  furosemide (LASIX) 40 MG tablet Take 1 tablet (40 mg total) by mouth 2 (two) times daily. 01/20/15  Yes [name], MD  Lactulose 20  GM/30ML SOLN Take 30 mLs (20 g total) by mouth 3 (three) times daily. 01/21/15  Yes [name], MD  spironolactone (ALDACTONE) 100 MG tablet Take 1 tablet (100 mg total) by mouth daily. 01/20/15  Yes [name], MD  traMADol (ULTRAM) 50 MG tablet Take 1 tablet (50 mg total) by mouth every 8 (eight) hours as needed. 01/20/15  Yes Dessa PhiJosalyn Funches, MD  Elastic Bandages & Supports (MEDICAL COMPRESSION STOCKINGS) MISC 1 each by Does not apply route daily. 20 mmHg pressure Patient not taking: Reported on 01/28/2015 08/23/14   Dessa PhiJosalyn Funches, MD  zolpidem (AMBIEN) 10 MG tablet Take 10 mg by mouth at bedtime as needed for sleep.    Historical Provider, MD    Allergies as of 01/03/2015  . (No Known Allergies)    Family History  Problem Relation Age of Onset  . Hypertension Mother   . Cancer Mother   . Diabetes Mother   . Colon cancer Neg Hx   . Liver disease      Social History   Social History  . Marital Status: Legally Separated    Spouse Name: N/A  . Number of Children: N/A  . Years of Education: N/A   Occupational History  . Not on file.   Social History Main Topics  . Smoking status: Former Smoker -- 20 years    Types: Cigarettes    Quit date: 08/02/2014  . Smokeless tobacco: Never Used  .  Alcohol Use: 0.0 oz/week    0 Standard drinks or equivalent per week     Comment: Has not had ETOH in 09/08/14  . Drug Use: No  . Sexual Activity: Yes    Birth Control/ Protection: IUD   Other Topics Concern  . Not on file   Social History Narrative    Review of Systems: See HPI, otherwise negative ROS   Physical Exam: BP 135/76 mmHg  Pulse 94  Temp(Src) 98.1 F (36.7 C) (Oral)  Resp 12  Ht  (1.753 m)  Wt 233 lb (105.688 kg)  BMI 34.39 kg/m2  SpO2 100%  LMP  General:   Alert,  pleasant and cooperative in NAD Head:  Normocephalic and atraumatic. Neck:  Supple; Lungs:  Clear throughout to auscultation.    Heart:  Regular rate and rhythm. Abdomen:  Soft,  nontender and nondistended. Normal bowel sounds, without guarding, and without rebound.   Neurologic:  Alert and  oriented x4;  grossly normal neurologically.  Impression/Plan:    SCREENING FOR VARICES.  PLAN: 1. EGD TODAY

## 2015-02-08 NOTE — Progress Notes (Signed)
REVIEWED-NO ADDITIONAL RECOMMENDATIONS. 

## 2015-02-08 NOTE — Progress Notes (Signed)
Awake. Swallowing without difficulty. Sprite given to drink. Tolerated well. 

## 2015-02-08 NOTE — Transfer of Care (Signed)
Immediate Anesthesia Transfer of Care Note  Patient: Julie Rowland  Procedure(s) Performed: Procedure(s) with comments: ESOPHAGOGASTRODUODENOSCOPY (EGD) WITH PROPOFOL (N/A) - 1030 GASTRIC VARICES BANDING (N/A) - 3 bands  Patient Location: PACU  Anesthesia Type:MAC  Level of Consciousness: awake, alert  and oriented  Airway & Oxygen Therapy: Patient Spontanous Breathing and Patient connected to nasal cannula oxygen  Post-op Assessment: Report given to RN  Post vital signs: Reviewed and stable  Last Vitals:  Filed Vitals:   02/08/15 1035  BP: 138/71  Pulse:   Temp:   Resp: 15    Complications: No apparent anesthesia complications

## 2015-02-08 NOTE — Anesthesia Postprocedure Evaluation (Signed)
  Anesthesia Post-op Note  Patient: Julie Rowland  Procedure(s) Performed: Procedure(s) with comments: ESOPHAGOGASTRODUODENOSCOPY (EGD) WITH PROPOFOL (N/A) - 1030 GASTRIC VARICES BANDING (N/A) - 3 bands  Patient Location: PACU  Anesthesia Type:MAC  Level of Consciousness: awake, alert  and oriented  Airway and Oxygen Therapy: Patient Spontanous Breathing  Post-op Pain: none  Post-op Assessment: Post-op Vital signs reviewed, Patient's Cardiovascular Status Stable, Respiratory Function Stable, Patent Airway and No signs of Nausea or vomiting              Post-op Vital Signs: Reviewed and stable  Last Vitals:  Filed Vitals:   02/08/15 1035  BP: 138/71  Pulse:   Temp:   Resp: 15    Complications: No apparent anesthesia complications

## 2015-02-08 NOTE — Op Note (Signed)
[facility] [address] [place], [id number]   ENDOSCOPY PROCEDURE REPORT  PATIENT: Julie Rowland, Julie Rowland  MR#: [id number] BIRTHDATE: 1971/02/04 , 44  yrs. old GENDER: female  ENDOSCOPIST: Julie BaliSandi Rowland Julie Landgren, MD REFERRED XL:[id number]:Julie Funches, MD PROCEDURE DATE: 02/08/2015 PROCEDURE:   EGD w/ band ligation of varices  INDICATIONS:screening for varices.  LAST EGD/EVL AUG 2016. LAST ETOH AUG 2016. WORKING ON DISABLITY. Cr UP 2.25 ON ALDACTONE/LASIX BID MEDICATIONS: Monitored anesthesia care TOPICAL ANESTHETIC:   Viscous Xylocaine ASA CLASS:  DESCRIPTION OF PROCEDURE:     Physical exam was performed.  Informed consent was obtained from the patient after explaining the benefits, risks, and alternatives to the procedure.  The patient was connected to the monitor and placed in the left lateral position.  Continuous oxygen was provided by nasal cannula and IV medicine administered through an indwelling cannula.  After administration of sedation, the patients esophagus was intubated and the     endoscope was advanced under direct visualization to the second portion of the duodenum.  The scope was removed slowly by carefully examining the color, texture, anatomy, and integrity of the mucosa on the way out.  The patient was recovered in endoscopy and discharged home in satisfactory condition.  Estimated blood loss is zero unless otherwise noted in this procedure report.     ESOPHAGUS: 2 COLUMNS OF GRADE III VARICES.  3 BANDS APPLIED. STOMACH: MILD PORTAL HYPERTENSIVE GASTROPATHY.   DUODENUM: The duodenal mucosa showed no abnormalities in the bulb and 2nd part of the duodenum. COMPLICATIONS: There were no immediate complications.  ENDOSCOPIC IMPRESSION: 1.   2 COLUMNS OF GRADE III VARICES. 2.   MILD PORTAL HYPERTENSIVE GASTROPATHY  RECOMMENDATIONS: CONTINUE YOUR WEIGHT LOSS EFFORTS. DRINK WATER TO KEEP URINE LIGHT YELLOW. CUT BACK ON YOUR LASIX TO ONCE A  DAY. CONTINUE ALDACTONE. CIPRO 250 MG TWICE DAILY FOR 7 DAYS DUE TO GFR 25. RECHECK BMP NOV 1. REPEAT EGD IN 3 MOS. [facility].  REPEAT EXAM: eSigned:  West BaliSandi Rowland [name], MD 02/08/2015 4:28 PMreviseD  CPT CODES: ICD CODES:  The ICD and CPT codes recommended by this software are interpretations from the data that the clinical staff has captured with the software.  The verification of the translation of this report to the ICD and CPT codes and modifiers is the sole responsibility of the health care institution and practicing physician where this report was generated.  [employer] will not be held responsible for the validity of the ICD and CPT codes included on this report.  AMA assumes no liability for data contained or not contained herein. CPT is a Publishing rights managerregistered trademark of the [employer].

## 2015-02-08 NOTE — Discharge Instructions (Signed)
You have esophageal varices. I PLACED THREE BANDS.    CONGRATULATIONS ON LOSING WEIGHT. CONTINUE YOUR WEIGHT LOSS EFFORTS. IT WILL HELP YOUR LIVER.  DRINK WATER TO KEEP YOUR URINE LIGHT YELLOW.  CUT BACK ON YOUR LASIX TO ONCE A DAY. YOUR KIDNEY FUNCTION IS GETTING WORSE.  CONTINUE ALDACTONE.  TAKE CIRPO 250 MG TWICE DAILY FOR 7 DAYS. TAKE ALL OF YOUR CIPRO AS PRESCRIBED.  RECHECK KIDNEY FUNCTION AND POTASSIUM NEXT TUES NOV 1.  REPEAT EGD IN 3 MOS.  [facility].  UPPER ENDOSCOPY AFTER CARE Read the instructions outlined below and refer to this sheet in the next week. These discharge instructions provide you with general information on caring for yourself after you leave the hospital. While your treatment has been planned according to the most current medical practices available, unavoidable complications occasionally occur. If you have any problems or questions after discharge, call DR. Lessly Stigler, [phone].  ACTIVITY  You may resume your regular activity, but move at a slower pace for the next 24 hours.   Take frequent rest periods for the next 24 hours.   Walking will help get rid of the air and reduce the bloated feeling in your belly (abdomen).   No driving for 24 hours (because of the medicine (anesthesia) used during the test).   You may shower.   Do not sign any important legal documents or operate any machinery for 24 hours (because of the anesthesia used during the test).    NUTRITION  Drink plenty of fluids.   You may resume your normal diet as instructed by your doctor.   Begin with a light meal and progress to your normal diet. Heavy or fried foods are harder to digest and may make you feel sick to your stomach (nauseated).   Avoid alcoholic beverages for 24 hours or as instructed.    MEDICATIONS  You may resume your normal medications.   WHAT YOU CAN EXPECT TODAY  Some feelings of bloating in the abdomen.   Passage of more gas than  usual.    IF YOU HAD A BIOPSY TAKEN DURING THE UPPER ENDOSCOPY:  Eat a soft diet IF YOU HAVE NAUSEA, BLOATING, ABDOMINAL PAIN, OR VOMITING.    FINDING OUT THE RESULTS OF YOUR TEST Not all test results are available during your visit. DR. [name] WILL CALL YOU WITHIN 14 DAYS OF YOUR PROCEDUE WITH YOUR RESULTS. Do not assume everything is normal if you have not heard from DR. Aracelli Woloszyn, CALL HER OFFICE AT [phone].  SEEK IMMEDIATE MEDICAL ATTENTION AND CALL THE OFFICE: [phone] IF:  You have more than a spotting of blood in your stool.   Your belly is swollen (abdominal distention).   You are nauseated or vomiting.   You have a temperature over 101F.   You have abdominal pain or discomfort that is severe or gets worse throughout the day.

## 2015-02-08 NOTE — Anesthesia Preprocedure Evaluation (Signed)
Anesthesia Evaluation  Patient identified by MRN, date of birth, ID band Patient awake    Reviewed: Allergy & Precautions, NPO status , Patient's Chart, lab work & pertinent test results  Airway Mallampati: II  TM Distance: >3 FB Neck ROM: Full    Dental  (+) Teeth Intact, Dental Advisory Given   Pulmonary former smoker,    breath sounds clear to auscultation       Cardiovascular hypertension, Pt. on medications + Peripheral Vascular Disease   Rhythm:Regular Rate:Normal     Neuro/Psych  Headaches, Seizures -,  PSYCHIATRIC DISORDERS Anxiety Depression    GI/Hepatic (+) Cirrhosis   Esophageal Varices and ascites  substance abuse  alcohol use, Portal htn    Endo/Other    Renal/GU      Musculoskeletal   Abdominal (+) + obese,   Peds  Hematology   Anesthesia Other Findings   Reproductive/Obstetrics                             Anesthesia Physical Anesthesia Plan  ASA: III  Anesthesia Plan: MAC   Post-op Pain Management:    Induction: Intravenous  Airway Management Planned: Simple Face Mask  Additional Equipment:   Intra-op Plan:   Post-operative Plan:   Informed Consent: I have reviewed the patients History and Physical, chart, labs and discussed the procedure including the risks, benefits and alternatives for the proposed anesthesia with the patient or authorized representative who has indicated his/her understanding and acceptance.     Plan Discussed with:   Anesthesia Plan Comments:         Anesthesia Quick Evaluation

## 2015-02-09 ENCOUNTER — Other Ambulatory Visit: Payer: Self-pay

## 2015-02-09 ENCOUNTER — Telehealth: Payer: Self-pay

## 2015-02-09 ENCOUNTER — Encounter (HOSPITAL_COMMUNITY): Payer: Self-pay | Admitting: Gastroenterology

## 2015-02-09 DIAGNOSIS — R7989 Other specified abnormal findings of blood chemistry: Secondary | ICD-10-CM

## 2015-02-09 NOTE — Telephone Encounter (Signed)
-----   Message from West BaliSandi L Fields, MD sent at 02/08/2015  4:29 PM EDT ----- RECHECK BMP NOV 1.

## 2015-02-09 NOTE — Telephone Encounter (Signed)
Mailed pt lab request and letter

## 2015-02-17 ENCOUNTER — Ambulatory Visit: Payer: No Typology Code available for payment source | Admitting: Family Medicine

## 2015-02-19 ENCOUNTER — Inpatient Hospital Stay (HOSPITAL_COMMUNITY)
Admission: EM | Admit: 2015-02-19 | Discharge: 2015-02-23 | DRG: 442 | Disposition: A | Discharge status: Home / Self Care | Payer: Medicaid Other | Attending: Family Medicine | Admitting: Family Medicine

## 2015-02-19 ENCOUNTER — Encounter (HOSPITAL_COMMUNITY): Payer: Self-pay | Admitting: *Deleted

## 2015-02-19 DIAGNOSIS — I10 Essential (primary) hypertension: Secondary | ICD-10-CM | POA: Diagnosis present

## 2015-02-19 DIAGNOSIS — K7460 Unspecified cirrhosis of liver: Secondary | ICD-10-CM

## 2015-02-19 DIAGNOSIS — R000 Tachycardia, unspecified: Secondary | ICD-10-CM | POA: Diagnosis not present

## 2015-02-19 DIAGNOSIS — R195 Other fecal abnormalities: Secondary | ICD-10-CM

## 2015-02-19 DIAGNOSIS — K7682 Hepatic encephalopathy: Secondary | ICD-10-CM

## 2015-02-19 DIAGNOSIS — I8500 Esophageal varices without bleeding: Secondary | ICD-10-CM | POA: Diagnosis present

## 2015-02-19 DIAGNOSIS — K5900 Constipation, unspecified: Secondary | ICD-10-CM | POA: Diagnosis present

## 2015-02-19 DIAGNOSIS — K7031 Alcoholic cirrhosis of liver with ascites: Secondary | ICD-10-CM | POA: Diagnosis present

## 2015-02-19 DIAGNOSIS — Z87891 Personal history of nicotine dependence: Secondary | ICD-10-CM

## 2015-02-19 DIAGNOSIS — Z79899 Other long term (current) drug therapy: Secondary | ICD-10-CM

## 2015-02-19 DIAGNOSIS — N184 Chronic kidney disease, stage 4 (severe): Secondary | ICD-10-CM | POA: Diagnosis present

## 2015-02-19 DIAGNOSIS — F1010 Alcohol abuse, uncomplicated: Secondary | ICD-10-CM | POA: Diagnosis present

## 2015-02-19 DIAGNOSIS — R1084 Generalized abdominal pain: Secondary | ICD-10-CM

## 2015-02-19 DIAGNOSIS — R188 Other ascites: Secondary | ICD-10-CM

## 2015-02-19 DIAGNOSIS — G9340 Encephalopathy, unspecified: Secondary | ICD-10-CM

## 2015-02-19 DIAGNOSIS — K766 Portal hypertension: Secondary | ICD-10-CM | POA: Diagnosis present

## 2015-02-19 DIAGNOSIS — R509 Fever, unspecified: Secondary | ICD-10-CM | POA: Diagnosis present

## 2015-02-19 DIAGNOSIS — K2210 Ulcer of esophagus without bleeding: Secondary | ICD-10-CM | POA: Diagnosis present

## 2015-02-19 DIAGNOSIS — D61818 Other pancytopenia: Secondary | ICD-10-CM | POA: Diagnosis present

## 2015-02-19 DIAGNOSIS — F329 Major depressive disorder, single episode, unspecified: Secondary | ICD-10-CM | POA: Diagnosis present

## 2015-02-19 DIAGNOSIS — K7290 Hepatic failure, unspecified without coma: Principal | ICD-10-CM | POA: Diagnosis present

## 2015-02-19 DIAGNOSIS — G40909 Epilepsy, unspecified, not intractable, without status epilepticus: Secondary | ICD-10-CM | POA: Diagnosis present

## 2015-02-19 DIAGNOSIS — K7581 Nonalcoholic steatohepatitis (NASH): Secondary | ICD-10-CM | POA: Diagnosis present

## 2015-02-19 DIAGNOSIS — N179 Acute kidney failure, unspecified: Secondary | ICD-10-CM | POA: Diagnosis present

## 2015-02-19 DIAGNOSIS — R109 Unspecified abdominal pain: Secondary | ICD-10-CM | POA: Diagnosis present

## 2015-02-19 DIAGNOSIS — K3189 Other diseases of stomach and duodenum: Secondary | ICD-10-CM | POA: Diagnosis present

## 2015-02-19 DIAGNOSIS — I129 Hypertensive chronic kidney disease with stage 1 through stage 4 chronic kidney disease, or unspecified chronic kidney disease: Secondary | ICD-10-CM | POA: Diagnosis present

## 2015-02-19 DIAGNOSIS — K921 Melena: Secondary | ICD-10-CM | POA: Diagnosis present

## 2015-02-19 DIAGNOSIS — R609 Edema, unspecified: Secondary | ICD-10-CM

## 2015-02-19 DIAGNOSIS — E7220 Disorder of urea cycle metabolism, unspecified: Secondary | ICD-10-CM | POA: Diagnosis present

## 2015-02-19 DIAGNOSIS — D62 Acute posthemorrhagic anemia: Secondary | ICD-10-CM | POA: Insufficient documentation

## 2015-02-19 DIAGNOSIS — K652 Spontaneous bacterial peritonitis: Secondary | ICD-10-CM | POA: Diagnosis present

## 2015-02-19 DIAGNOSIS — E876 Hypokalemia: Secondary | ICD-10-CM | POA: Diagnosis present

## 2015-02-19 DIAGNOSIS — R600 Localized edema: Secondary | ICD-10-CM | POA: Diagnosis present

## 2015-02-19 DIAGNOSIS — Z6835 Body mass index (BMI) 35.0-35.9, adult: Secondary | ICD-10-CM

## 2015-02-19 HISTORY — DX: Hepatic failure, unspecified without coma: K72.90

## 2015-02-19 HISTORY — DX: Hepatic encephalopathy: K76.82

## 2015-02-19 MED ORDER — IOHEXOL 300 MG/ML  SOLN: 25.0000 mL | Freq: Once | INTRAMUSCULAR | Status: DC | PRN

## 2015-02-19 MED ORDER — MORPHINE SULFATE (PF) 4 MG/ML IV SOLN
6.0000 mg | Freq: Once | INTRAVENOUS | Status: AC
  Administered 2015-02-19: 6 mg via INTRAVENOUS
  Filled 2015-02-19: qty 2

## 2015-02-19 NOTE — ED Provider Notes (Signed)
CSN: [id number]   Arrival date & time 02/19/15 2134  History  By signing my name below, I, Bethel Born, attest that this documentation has been prepared under the direction and in the presence of Tomasita Crumble, MD. Electronically Signed: Bethel Born, ED Scribe. 02/19/2015. 11:37 PM.  Chief Complaint  Patient presents with  . Constipation    HPI The history is provided by the patient. No language interpreter was used.   Julie Rowland is a 44 y.o. female with history of cirrhosis and HTN who presents to the Emergency Department complaining of constipation with onset 3 days ago.Doculax and another OTC laxative provided insufficient relief at home. Pt states that she has been having to strain to pass stool and last night she had to use her hands to manually remove stool. Tonight she continued to strain and noticed blood in the stool. She uses pain medication for the pain that she has had since surgery in March.  Associated symptoms include abdominal pain and distension. Pt denies fever, nausea, vomiting. She states that her liver disease is well controlled and that she no longer uses alcohol.   Past Medical History  Diagnosis Date  . Migraine headache     At age 22  . Hypertension Dx 2003  . Anxiety Dx 2002  . Seizures (HCC) 2014 and 2009  . Portal hypertension (HCC)   . Ascites     Past Surgical History  Procedure Laterality Date  . Colonoscopy  2015  . Vein ligation and stripping N/A 07/14/2014    Procedure: EXCISION OF VARICOSE VEINS AT UMBILICUS;  Surgeon: Larina Earthly, MD;  Location: [facility] OR;  Service: Vascular;  Laterality: N/A;  . Umbilical hernia repair N/A 07/14/2014    Procedure: HERNIA REPAIR UMBILICAL ADULT;  Surgeon: Larina Earthly, MD;  Location: [facility] OR;  Service: Vascular;  Laterality: N/A;  . Esophagogastroduodenoscopy (egd) with propofol N/A 12/07/2014    WUJ:[id number] portal hypertensive gastropathy/grade 3 varices  . Esophageal banding N/A 12/07/2014     Procedure: ESOPHAGEAL BANDING (4 bands);  Surgeon: West Bali, MD;  Location: [facility];  Service: Endoscopy;  Laterality: N/A;  . Esophagogastroduodenoscopy (egd) with propofol N/A 02/08/2015    Procedure: ESOPHAGOGASTRODUODENOSCOPY (EGD) WITH PROPOFOL;  Surgeon: West Bali, MD;  Location: [facility]S;  Service: Endoscopy;  Laterality: N/A;  1030  . Gastric varices banding N/A 02/08/2015    Procedure: GASTRIC VARICES BANDING;  Surgeon: West Bali, MD;  Location: [facility];  Service: Endoscopy;  Laterality: N/A;  3 bands    Family History  Problem Relation Age of Onset  . Hypertension Mother   . Cancer Mother   . Diabetes Mother   . Colon cancer Neg Hx   . Liver disease      Social History  Substance Use Topics  . Smoking status: Former Smoker -- 20 years    Types: Cigarettes    Quit date: 08/02/2014  . Smokeless tobacco: Never Used  . Alcohol Use: 0.0 oz/week    0 Standard drinks or equivalent per week     Comment: Has not had ETOH in 09/08/14     Review of Systems 10 Systems reviewed and all are negative for acute change except as noted in the HPI. Home Medications   Prior to Admission medications   Medication Sig Start Date End Date Taking? Authorizing Provider  furosemide (LASIX) 40 MG tablet WITH ALDACTONE 100 MG DAILY 02/08/15  Yes West Bali, MD  spironolactone (ALDACTONE) 100 MG  tablet Take 1 tablet (100 mg total) by mouth daily. 01/20/15  Yes [name], MD  acetaminophen-codeine (TYLENOL #3) 300-30 MG tablet Take 1 tablet by mouth 2 (two) times daily. Patient not taking: Reported on 02/19/2015 01/20/15   Dessa Phi, MD  ciprofloxacin (CIPRO) 250 MG tablet 1 po bid for 7 days Patient not taking: Reported on 02/19/2015 02/08/15   West Bali, MD  cyclobenzaprine (FLEXERIL) 10 MG tablet Take 1 tablet (10 mg total) by mouth 2 (two) times daily as needed for muscle spasms (caution will cause drowsiness). For headache per patient 10/09/12   Cleora Fleet, MD   Lactulose 20 GM/30ML SOLN Take 30 mLs (20 g total) by mouth 3 (three) times daily. Patient not taking: Reported on 02/19/2015 01/21/15   Dessa Phi, MD  traMADol (ULTRAM) 50 MG tablet Take 1 tablet (50 mg total) by mouth every 8 (eight) hours as needed. Patient not taking: Reported on 02/19/2015 01/20/15   Dessa Phi, MD  zolpidem (AMBIEN) 10 MG tablet Take 10 mg by mouth at bedtime as needed for sleep.    Historical Provider, MD    Allergies  Review of patient's allergies indicates no known allergies.  Triage Vitals: BP 148/72 mmHg  Pulse 88  Temp(Src) 98.1 F (36.7 C) (Oral)  Resp 20  Ht  (1.753 m)  Wt 230 lb (104.327 kg)  BMI 33.95 kg/m2  SpO2 98%  LMP  (Approximate)  Physical Exam  Constitutional: She is oriented to person, place, and time. She appears well-developed and well-nourished. No distress.  HENT:  Head: Normocephalic and atraumatic.  Nose: Nose normal.  Mouth/Throat: Oropharynx is clear and moist. Mucous membranes are dry. No oropharyngeal exudate.  Eyes: EOM are normal. Pupils are equal, round, and reactive to light. Scleral icterus is present.  Scleral juandice  Neck: Normal range of motion. Neck supple. No JVD present. No tracheal deviation present. No thyromegaly present.  Cardiovascular: Normal rate, regular rhythm and normal heart sounds.  Exam reveals no gallop and no friction rub.   No murmur heard. Pulmonary/Chest: Effort normal and breath sounds normal. No respiratory distress. She has no wheezes. She exhibits no tenderness.  Abdominal: Soft. Bowel sounds are normal. She exhibits distension. She exhibits no mass. There is tenderness in the right upper quadrant. There is no rebound and no guarding.  +fluid wave  Musculoskeletal: Normal range of motion. She exhibits no edema or tenderness.  BLE edema  Lymphadenopathy:    She has no cervical adenopathy.  Neurological: She is alert and oriented to person, place, and time. No cranial nerve  deficit. She exhibits normal muscle tone.  Skin: Skin is warm and dry. No rash noted. No erythema. No pallor.  Skin is diffusely jaundiced  Nursing note and vitals reviewed.   ED Course  Procedures   DIAGNOSTIC STUDIES: Oxygen Saturation is 98% on RA, normal by my interpretation.    COORDINATION OF CARE: 11:16 PM Discussed treatment plan which includes CT A/P with contrast, lab work, pain management with pt at bedside and pt agreed to plan.  Labs Reviewed  CBC WITH DIFFERENTIAL/PLATELET - Abnormal; Notable for the following:    RBC 2.34 (*)    Hemoglobin 8.1 (*)    HCT 22.8 (*)    MCH 34.6 (*)    Platelets 90 (*)    All other components within normal limits  COMPREHENSIVE METABOLIC PANEL - Abnormal; Notable for the following:    Sodium 134 (*)    Potassium 3.1 (*)  CO2 20 (*)    Glucose, Bld 107 (*)    Creatinine, Ser 2.34 (*)    Albumin 2.9 (*)    Total Bilirubin 4.7 (*)    GFR calc non Af Amer 24 (*)    GFR calc Af Amer 28 (*)    All other components within normal limits  PROTIME-INR - Abnormal; Notable for the following:    Prothrombin Time 20.5 (*)    INR 1.76 (*)    All other components within normal limits  URINALYSIS, ROUTINE W REFLEX MICROSCOPIC (NOT AT [facility]) - Abnormal; Notable for the following:    Color, Urine AMBER (*)    APPearance CLOUDY (*)    Hgb urine dipstick LARGE (*)    Protein, ur 30 (*)    Leukocytes, UA TRACE (*)    All other components within normal limits  AMMONIA - Abnormal; Notable for the following:    Ammonia 64 (*)    All other components within normal limits  URINE MICROSCOPIC-ADD ON - Abnormal; Notable for the following:    Squamous Epithelial / LPF FEW (*)    Bacteria, UA FEW (*)    All other components within normal limits  LIPASE, BLOOD  POC URINE PREG, ED    Imaging Review No results found.  I personally reviewed and evaluated these images and lab results as a part of my medical decision-making.  MDM   Final  diagnoses:  None   Patient presents to the ED for abdominal pain, distension and constipation.  She has a history of cirrhosis.  Differential includes SBO vs. SBP.  Will evaluate with labs and CT scan of abdomen.  Patient given morphine for pain control.   CT scan neg for SBO or acute pathology.  SBP still a possibility, blood work shows worsening INR of 1.7 and platelets of 90.  Patietn will require tap from IR inpateint.  Blood cultures drawn, will page triad hospitalist for admission.  I spoke with Dr. Lovell SheehanJenkins, she recommends giving the patient Zosyn, this was done. Patient was admitted for further care. She is now tachycardic but blood pressure is stable.  I personally performed the services described in this documentation, which was scribed in my presence. The recorded information has been reviewed and is accurate.     Tomasita CrumbleAdeleke Valori Hollenkamp, MD 02/20/15 [phone]

## 2015-02-19 NOTE — ED Notes (Signed)
Patient presents with c/o inability to have a BM since Thursday.  Was taking pain med for her 3 surgeries since March

## 2015-02-20 ENCOUNTER — Encounter (HOSPITAL_COMMUNITY): Payer: Self-pay | Admitting: General Practice

## 2015-02-20 ENCOUNTER — Inpatient Hospital Stay (HOSPITAL_COMMUNITY): Payer: Medicaid Other

## 2015-02-20 ENCOUNTER — Emergency Department (HOSPITAL_COMMUNITY): Payer: Medicaid Other

## 2015-02-20 DIAGNOSIS — K7581 Nonalcoholic steatohepatitis (NASH): Secondary | ICD-10-CM | POA: Diagnosis present

## 2015-02-20 DIAGNOSIS — K2210 Ulcer of esophagus without bleeding: Secondary | ICD-10-CM | POA: Diagnosis present

## 2015-02-20 DIAGNOSIS — Z6835 Body mass index (BMI) 35.0-35.9, adult: Secondary | ICD-10-CM | POA: Diagnosis not present

## 2015-02-20 DIAGNOSIS — K7460 Unspecified cirrhosis of liver: Secondary | ICD-10-CM

## 2015-02-20 DIAGNOSIS — I129 Hypertensive chronic kidney disease with stage 1 through stage 4 chronic kidney disease, or unspecified chronic kidney disease: Secondary | ICD-10-CM | POA: Diagnosis present

## 2015-02-20 DIAGNOSIS — I8500 Esophageal varices without bleeding: Secondary | ICD-10-CM | POA: Diagnosis present

## 2015-02-20 DIAGNOSIS — Z87891 Personal history of nicotine dependence: Secondary | ICD-10-CM | POA: Diagnosis not present

## 2015-02-20 DIAGNOSIS — F1010 Alcohol abuse, uncomplicated: Secondary | ICD-10-CM | POA: Diagnosis present

## 2015-02-20 DIAGNOSIS — E7220 Disorder of urea cycle metabolism, unspecified: Secondary | ICD-10-CM

## 2015-02-20 DIAGNOSIS — Z79899 Other long term (current) drug therapy: Secondary | ICD-10-CM | POA: Diagnosis not present

## 2015-02-20 DIAGNOSIS — K7031 Alcoholic cirrhosis of liver with ascites: Secondary | ICD-10-CM | POA: Diagnosis present

## 2015-02-20 DIAGNOSIS — D62 Acute posthemorrhagic anemia: Secondary | ICD-10-CM | POA: Diagnosis present

## 2015-02-20 DIAGNOSIS — N184 Chronic kidney disease, stage 4 (severe): Secondary | ICD-10-CM | POA: Diagnosis present

## 2015-02-20 DIAGNOSIS — N179 Acute kidney failure, unspecified: Secondary | ICD-10-CM | POA: Diagnosis present

## 2015-02-20 DIAGNOSIS — R509 Fever, unspecified: Secondary | ICD-10-CM

## 2015-02-20 DIAGNOSIS — K5900 Constipation, unspecified: Secondary | ICD-10-CM | POA: Diagnosis present

## 2015-02-20 DIAGNOSIS — K3189 Other diseases of stomach and duodenum: Secondary | ICD-10-CM | POA: Diagnosis present

## 2015-02-20 DIAGNOSIS — E876 Hypokalemia: Secondary | ICD-10-CM | POA: Diagnosis present

## 2015-02-20 DIAGNOSIS — K7290 Hepatic failure, unspecified without coma: Secondary | ICD-10-CM | POA: Diagnosis present

## 2015-02-20 DIAGNOSIS — D61818 Other pancytopenia: Secondary | ICD-10-CM | POA: Diagnosis present

## 2015-02-20 DIAGNOSIS — R1084 Generalized abdominal pain: Secondary | ICD-10-CM | POA: Diagnosis present

## 2015-02-20 DIAGNOSIS — R1030 Lower abdominal pain, unspecified: Secondary | ICD-10-CM

## 2015-02-20 DIAGNOSIS — G40909 Epilepsy, unspecified, not intractable, without status epilepticus: Secondary | ICD-10-CM | POA: Diagnosis present

## 2015-02-20 DIAGNOSIS — R000 Tachycardia, unspecified: Secondary | ICD-10-CM | POA: Diagnosis not present

## 2015-02-20 DIAGNOSIS — R600 Localized edema: Secondary | ICD-10-CM | POA: Diagnosis present

## 2015-02-20 DIAGNOSIS — K766 Portal hypertension: Secondary | ICD-10-CM | POA: Diagnosis present

## 2015-02-20 DIAGNOSIS — R195 Other fecal abnormalities: Secondary | ICD-10-CM

## 2015-02-20 DIAGNOSIS — K652 Spontaneous bacterial peritonitis: Secondary | ICD-10-CM | POA: Diagnosis present

## 2015-02-20 DIAGNOSIS — K921 Melena: Secondary | ICD-10-CM | POA: Diagnosis present

## 2015-02-20 DIAGNOSIS — G9340 Encephalopathy, unspecified: Secondary | ICD-10-CM

## 2015-02-20 DIAGNOSIS — F329 Major depressive disorder, single episode, unspecified: Secondary | ICD-10-CM | POA: Diagnosis present

## 2015-02-20 LAB — COMPREHENSIVE METABOLIC PANEL
ALBUMIN: 2.9 g/dL — AB (ref 3.5–5.0)
ALT: 22 U/L (ref 14–54)
AST: 30 U/L (ref 15–41)
Alkaline Phosphatase: 53 U/L (ref 38–126)
Anion gap: 11 (ref 5–15)
BILIRUBIN TOTAL: 4.7 mg/dL — AB (ref 0.3–1.2)
BUN: 17 mg/dL (ref 6–20)
CO2: 20 mmol/L — ABNORMAL LOW (ref 22–32)
CREATININE: 2.34 mg/dL — AB (ref 0.44–1.00)
Calcium: 9.4 mg/dL (ref 8.9–10.3)
Chloride: 103 mmol/L (ref 101–111)
GFR calc Af Amer: 28 mL/min — ABNORMAL LOW (ref >60)
GFR calc non Af Amer: 24 mL/min — ABNORMAL LOW (ref >60)
Glucose, Bld: 107 mg/dL — ABNORMAL HIGH (ref 65–99)
POTASSIUM: 3.1 mmol/L — AB (ref 3.5–5.1)
Sodium: 134 mmol/L — ABNORMAL LOW (ref 135–145)
TOTAL PROTEIN: 7.8 g/dL (ref 6.5–8.1)

## 2015-02-20 LAB — PROTIME-INR
INR: 1.76 — ABNORMAL HIGH (ref 0.00–1.49)
Prothrombin Time: 20.5 seconds — ABNORMAL HIGH (ref 11.6–15.2)

## 2015-02-20 LAB — URINE MICROSCOPIC-ADD ON

## 2015-02-20 LAB — CBC
HEMATOCRIT: 25.3 % — AB (ref 36.0–46.0)
HEMOGLOBIN: 8.6 g/dL — AB (ref 12.0–15.0)
MCH: 34 pg (ref 26.0–34.0)
MCHC: 34 g/dL (ref 30.0–36.0)
MCV: 100 fL (ref 78.0–100.0)
Platelets: 109 10*3/uL — ABNORMAL LOW (ref 150–400)
RBC: 2.53 MIL/uL — AB (ref 3.87–5.11)
RDW: 13.8 % (ref 11.5–15.5)
WBC: 11.9 10*3/uL — ABNORMAL HIGH (ref 4.0–10.5)

## 2015-02-20 LAB — BASIC METABOLIC PANEL
ANION GAP: 15 (ref 5–15)
BUN: 19 mg/dL (ref 6–20)
CHLORIDE: 100 mmol/L — AB (ref 101–111)
CO2: 20 mmol/L — AB (ref 22–32)
Calcium: 9.6 mg/dL (ref 8.9–10.3)
Creatinine, Ser: 2.4 mg/dL — ABNORMAL HIGH (ref 0.44–1.00)
GFR calc non Af Amer: 23 mL/min — ABNORMAL LOW (ref >60)
GFR, EST AFRICAN AMERICAN: 27 mL/min — AB (ref >60)
Glucose, Bld: 138 mg/dL — ABNORMAL HIGH (ref 65–99)
Potassium: 3.4 mmol/L — ABNORMAL LOW (ref 3.5–5.1)
Sodium: 135 mmol/L (ref 135–145)

## 2015-02-20 LAB — URINALYSIS, ROUTINE W REFLEX MICROSCOPIC
Bilirubin Urine: NEGATIVE
Glucose, UA: NEGATIVE mg/dL
KETONES UR: NEGATIVE mg/dL
NITRITE: NEGATIVE
PH: 5 (ref 5.0–8.0)
Protein, ur: 30 mg/dL — AB
Specific Gravity, Urine: 1.014 (ref 1.005–1.030)
Urobilinogen, UA: 0.2 mg/dL (ref 0.0–1.0)

## 2015-02-20 LAB — CBC WITH DIFFERENTIAL/PLATELET
BASOS ABS: 0 10*3/uL (ref 0.0–0.1)
BASOS PCT: 0 %
Eosinophils Absolute: 0.3 10*3/uL (ref 0.0–0.7)
Eosinophils Relative: 3 %
HEMATOCRIT: 22.8 % — AB (ref 36.0–46.0)
HEMOGLOBIN: 8.1 g/dL — AB (ref 12.0–15.0)
LYMPHS PCT: 20 %
Lymphs Abs: 1.6 10*3/uL (ref 0.7–4.0)
MCH: 34.6 pg — ABNORMAL HIGH (ref 26.0–34.0)
MCHC: 35.5 g/dL (ref 30.0–36.0)
MCV: 97.4 fL (ref 78.0–100.0)
Monocytes Absolute: 0.8 10*3/uL (ref 0.1–1.0)
Monocytes Relative: 10 %
NEUTROS ABS: 5.1 10*3/uL (ref 1.7–7.7)
NEUTROS PCT: 67 %
Platelets: 90 10*3/uL — ABNORMAL LOW (ref 150–400)
RBC: 2.34 MIL/uL — AB (ref 3.87–5.11)
RDW: 13.5 % (ref 11.5–15.5)
WBC: 7.7 10*3/uL (ref 4.0–10.5)

## 2015-02-20 LAB — AMMONIA: AMMONIA: 64 umol/L — AB (ref 9–35)

## 2015-02-20 LAB — POC URINE PREG, ED: PREG TEST UR: NEGATIVE

## 2015-02-20 LAB — LIPASE, BLOOD: Lipase: 24 U/L (ref 11–51)

## 2015-02-20 MED ORDER — ONDANSETRON HCL 4 MG/2ML IJ SOLN
4.0000 mg | Freq: Four times a day (QID) | INTRAMUSCULAR | Status: DC | PRN
  Administered 2015-02-20 – 2015-02-23 (×2): 4 mg via INTRAVENOUS
  Filled 2015-02-20: qty 2

## 2015-02-20 MED ORDER — ACETAMINOPHEN 325 MG PO TABS
650.0000 mg | ORAL_TABLET | Freq: Four times a day (QID) | ORAL | Status: DC | PRN
Start: 2015-02-20 — End: 2015-02-23
  Administered 2015-02-20 – 2015-02-21 (×2): 650 mg via ORAL
  Filled 2015-02-20 (×2): qty 2

## 2015-02-20 MED ORDER — ACETAMINOPHEN 650 MG RE SUPP: 650.0000 mg | Freq: Four times a day (QID) | RECTAL | Status: DC | PRN

## 2015-02-20 MED ORDER — ONDANSETRON HCL 4 MG/2ML IJ SOLN
4.0000 mg | Freq: Once | INTRAMUSCULAR | Status: AC
  Administered 2015-02-20: 4 mg via INTRAVENOUS
  Filled 2015-02-20: qty 2

## 2015-02-20 MED ORDER — LACTULOSE 10 GM/15ML PO SOLN
30.0000 g | Freq: Three times a day (TID) | ORAL | Status: DC
  Administered 2015-02-21 (×3): 30 g via ORAL
  Filled 2015-02-20 (×4): qty 45

## 2015-02-20 MED ORDER — HYDROMORPHONE HCL 1 MG/ML IJ SOLN
0.5000 mg | INTRAMUSCULAR | Status: DC | PRN
  Administered 2015-02-20: 1 mg via INTRAVENOUS
  Filled 2015-02-20 (×2): qty 1

## 2015-02-20 MED ORDER — FUROSEMIDE 40 MG PO TABS
40.0000 mg | ORAL_TABLET | Freq: Once | ORAL | Status: AC
  Administered 2015-02-20: 40 mg via ORAL
  Filled 2015-02-20: qty 1

## 2015-02-20 MED ORDER — MAGNESIUM CITRATE PO SOLN
1.0000 | Freq: Once | ORAL | Status: AC
  Administered 2015-02-20: 1 via ORAL
  Filled 2015-02-20: qty 296

## 2015-02-20 MED ORDER — OXYCODONE HCL 5 MG PO TABS
5.0000 mg | ORAL_TABLET | ORAL | Status: DC | PRN
  Administered 2015-02-20 – 2015-02-21 (×3): 5 mg via ORAL
  Filled 2015-02-20 (×3): qty 1

## 2015-02-20 MED ORDER — PIPERACILLIN-TAZOBACTAM 3.375 G IVPB 30 MIN
3.3750 g | Freq: Once | INTRAVENOUS | Status: DC
  Filled 2015-02-20: qty 50

## 2015-02-20 MED ORDER — ONDANSETRON HCL 4 MG PO TABS: 4.0000 mg | ORAL_TABLET | Freq: Four times a day (QID) | ORAL | Status: DC | PRN

## 2015-02-20 MED ORDER — SODIUM CHLORIDE 0.9 % IV SOLN
INTRAVENOUS | Status: DC
  Administered 2015-02-20: 06:00:00 via INTRAVENOUS

## 2015-02-20 MED ORDER — WHITE PETROLATUM GEL
Status: AC
  Administered 2015-02-20: 18:00:00
  Filled 2015-02-20: qty 1

## 2015-02-20 MED ORDER — DEXTROSE 5 % IV SOLN
2.0000 g | Freq: Two times a day (BID) | INTRAVENOUS | Status: DC
  Administered 2015-02-20: 2 g via INTRAVENOUS
  Filled 2015-02-20 (×2): qty 2

## 2015-02-20 MED ORDER — MORPHINE SULFATE (PF) 4 MG/ML IV SOLN
6.0000 mg | Freq: Once | INTRAVENOUS | Status: AC
  Administered 2015-02-20: 6 mg via INTRAVENOUS
  Filled 2015-02-20: qty 2

## 2015-02-20 MED ORDER — SODIUM CHLORIDE 0.9 % IJ SOLN
3.0000 mL | Freq: Two times a day (BID) | INTRAMUSCULAR | Status: DC
  Administered 2015-02-20 – 2015-02-22 (×3): 3 mL via INTRAVENOUS

## 2015-02-20 NOTE — H&P (Addendum)
[facility] Admission History and Physical       Julie MerlMichelle L Ralls RUE:[id number] DOB: 02/22/1971 DOA: 02/19/2015  Referring physician: EDP PCP: Lora PaulaFUNCHES, JOSALYN C, MD  Specialists:   Chief Complaint: ABD Pain and Fever  HPI: Julie Rowland is a 44 y.o. female with a history of NASH/Cirrhosis, Portal HTN, HTN, Pancytopenia, and Seizures who presents to the E with complaints of increasing ABD pain and Distention with Fever and chillsxX 3 days.   She also reports having constipation x 3 days.   She was referred for admission and placed on empiric antibiotic Rx for possible SBP with IV Cefotaxime.   Also her  Ammonia level was found to be elevated at 64.     Of note she has had a recent Gastric Variceal Banding performed by GI Dr. [name] on 02/08/2015.     Review of Systems:  Constitutional: No Weight Loss, No Weight Gain, Night Sweats, +Fevers, +Chills, Dizziness, Light Headedness, Fatigue, or Generalized Weakness HEENT: No Headaches, Difficulty Swallowing,Tooth/Dental Problems,Sore Throat,  No Sneezing, Rhinitis, Ear Ache, Nasal Congestion, or Post Nasal Drip,  Cardio-vascular:  No Chest pain, Orthopnea, PND, Edema in Lower Extremities, Anasarca, Dizziness, Palpitations  Resp: No Dyspnea, No DOE, No Productive Cough, No Non-Productive Cough, No Hemoptysis, No Wheezing.    GI: No Heartburn, Indigestion, +Abdominal Pain and Distention, Nausea, Vomiting, Diarrhea, +Constipation, Hematemesis, Hematochezia, Melena, Change in Bowel Habits,  Loss of Appetite  GU: No Dysuria, No Change in Color of Urine, No Urgency or Urinary Frequency, No Flank pain.  Musculoskeletal: No Joint Pain or Swelling, No Decreased Range of Motion, No Back Pain.  Neurologic: No Syncope, No Seizures, Muscle Weakness, Paresthesia, Vision Disturbance or Loss, No Diplopia, No Vertigo, No Difficulty Walking,  Skin: No Rash or Lesions. Psych: No Change in Mood or Affect, No Depression or Anxiety, No Memory  loss, No Confusion, or Hallucinations   Past Medical History  Diagnosis Date  . Migraine headache     At age 44  . Hypertension Dx 2003  . Anxiety Dx 2002  . Seizures (HCC) 2014 and 2009  . Portal hypertension (HCC)   . Ascites      Past Surgical History  Procedure Laterality Date  . Colonoscopy  2015  . Vein ligation and stripping N/A 07/14/2014    Procedure: EXCISION OF VARICOSE VEINS AT UMBILICUS;  Surgeon: Larina Earthlyodd F Early, MD;  [facility];  Service: Vascular;  Laterality: N/A;  . Umbilical hernia repair N/A 07/14/2014    Procedure: HERNIA REPAIR UMBILICAL ADULT;  Surgeon: Larina Earthlyodd F Early, MD;  [facility];  Service: Vascular;  Laterality: N/A;  . Esophagogastroduodenoscopy (egd) with propofol N/A 12/07/2014    JYN:[id number]:moderate portal hypertensive gastropathy/grade 3 varices  . Esophageal banding N/A 12/07/2014    Procedure: ESOPHAGEAL BANDING (4 bands);  Surgeon: West BaliSandi L Fields, MD;  Location: [facility];  Service: Endoscopy;  Laterality: N/A;  . Esophagogastroduodenoscopy (egd) with propofol N/A 02/08/2015    Procedure: ESOPHAGOGASTRODUODENOSCOPY (EGD) WITH PROPOFOL;  Surgeon: West BaliSandi L Fields, MD;  Location: [facility];  Service: Endoscopy;  Laterality: N/A;  1030  . Gastric varices banding N/A 02/08/2015    Procedure: GASTRIC VARICES BANDING;  Surgeon: West BaliSandi L Fields, MD;  Location: [facility];  Service: Endoscopy;  Laterality: N/A;  3 bands      Prior to Admission medications   Medication Sig Start Date End Date Taking? Authorizing Provider  furosemide (LASIX) 40 MG tablet WITH ALDACTONE 100 MG DAILY 02/08/15  Yes Sandi L  Fields, MD  spironolactone (ALDACTONE) 100 MG tablet Take 1 tablet (100 mg total) by mouth daily. 01/20/15  Yes [name], MD  acetaminophen-codeine (TYLENOL #3) 300-30 MG tablet Take 1 tablet by mouth 2 (two) times daily. Patient not taking: Reported on 02/19/2015 01/20/15   Dessa Phi, MD  ciprofloxacin (CIPRO) 250 MG tablet 1 po bid for 7 days Patient not  taking: Reported on 02/19/2015 02/08/15   West Bali, MD  cyclobenzaprine (FLEXERIL) 10 MG tablet Take 1 tablet (10 mg total) by mouth 2 (two) times daily as needed for muscle spasms (caution will cause drowsiness). For headache per patient 10/09/12   Cleora Fleet, MD  Lactulose 20 GM/30ML SOLN Take 30 mLs (20 g total) by mouth 3 (three) times daily. Patient not taking: Reported on 02/19/2015 01/21/15   Dessa Phi, MD  traMADol (ULTRAM) 50 MG tablet Take 1 tablet (50 mg total) by mouth every 8 (eight) hours as needed. Patient not taking: Reported on 02/19/2015 01/20/15   Dessa Phi, MD  zolpidem (AMBIEN) 10 MG tablet Take 10 mg by mouth at bedtime as needed for sleep.    Historical Provider, MD     No Known Allergies  Social History:  reports that she quit smoking about 6 months ago. Her smoking use included Cigarettes. She quit after 20 years of use. She has never used smokeless tobacco. She reports that she drinks alcohol. She reports that she does not use illicit drugs.    Family History  Problem Relation Age of Onset  . Hypertension Mother   . Cancer Mother   . Diabetes Mother   . Colon cancer Neg Hx   . Liver disease         Physical Exam:  GEN:  Pleasant Obese ill Appearing  44 y.o. Caucasian female examined and in no acute distress; cooperative with exam Filed Vitals:   02/20/15 0200 02/20/15 0245 02/20/15 0415 02/20/15 0423  BP: 135/67 130/70 130/83 130/83  Pulse: 98  102 102  Temp:    97.8 F (36.6 C)  TempSrc:    Oral  Resp:    20  Height:      Weight:      SpO2: 91%  98% 99%   Blood pressure 130/83, pulse 102, temperature 97.8 F (36.6 C), temperature source Oral, resp. rate 20, height  (1.753 m), weight 104.327 kg (230 lb), SpO2 99 %. PSYCH: She is alert and oriented x4; does not appear anxious does not appear depressed; affect is normal HEENT: Normocephalic and Atraumatic, Mucous membranes pink; PERRLA; EOM intact; Fundi:  Benign;  No scleral  icterus, Nares: Patent, Oropharynx: Clear, Fair Dentition,    Neck:  FROM, No Cervical Lymphadenopathy nor Thyromegaly or Carotid Bruit; No JVD; Breasts:: Not examined CHEST WALL: No tenderness CHEST: Normal respiration, clear to auscultation bilaterally HEART: Regular rate and rhythm; no murmurs rubs or gallops BACK: No kyphosis or scoliosis; No CVA tenderness ABDOMEN: Positive Bowel Sounds, Distended, Diffusely Tender, No Rebound or Guarding; No Masses, No Organomegaly. Rectal Exam: Not done EXTREMITIES: No Cyanosis, Clubbing, or Edema; +Venous Stasis Changes of BLEs, No Ulcerations. Genitalia: not examined PULSES: 2+ and symmetric SKIN: Normal hydration no rash or ulceration CNS:  Alert and Oriented x 4, No Focal Deficits  Vascular: pulses palpable throughout    Labs on Admission:  Basic Metabolic Panel:  Recent Labs Lab 02/19/15 2349  NA 134*  K 3.1*  CL 103  CO2 20*  GLUCOSE 107*  BUN 17  CREATININE 2.34*  CALCIUM 9.4   Liver Function Tests:  Recent Labs Lab 02/19/15 2349  AST 30  ALT 22  ALKPHOS 53  BILITOT 4.7*  PROT 7.8  ALBUMIN 2.9*    Recent Labs Lab 02/19/15 2349  LIPASE 24    Recent Labs Lab 02/19/15 2349  AMMONIA 64*   CBC:  Recent Labs Lab 02/19/15 2349  WBC 7.7  NEUTROABS 5.1  HGB 8.1*  HCT 22.8*  MCV 97.4  PLT 90*   Cardiac Enzymes: No results for input(s): CKTOTAL, CKMB, CKMBINDEX, TROPONINI in the last 168 hours.  BNP (last 3 results) No results for input(s): BNP in the last 8760 hours.  ProBNP (last 3 results) No results for input(s): PROBNP in the last 8760 hours.  CBG: No results for input(s): GLUCAP in the last 168 hours.  Radiological Exams on Admission: No results found.   EKG: Independently reviewed.    Assessment/Plan:   44 y.o. female with  Principal Problem:   1.     SBP (spontaneous bacterial peritonitis) (HCC)   IR consult for Diagnostic and Therapeutic Paracentesis   Empiric IV  Ceftriaxone   Active Problems:   2.    Fever due to #1     3.    Cirrhosis (HCC)/NASH (nonalcoholic steatohepatitis)   Monitor LFTS   Monitor Ammonia Levels     4.    Abdominal pain- due to Ascites, and Possible SBP   Pain Control with PRN IV Dilaudid     5.    Hyperammonemia (HCC)   Increase Lactulose to TID x 2 days    6.    Constipation   Should resolve with Lactulose Rx     7.    Essential hypertension   On Lasix and Spironolactone Rx   Monitor BPs      8.    Pancytopenia (HCC) due to #3   Monitor Trend    9.    DVT Prophylaxis   SCDs        Code Status:     FULL CODE        Family Communication:    No Family Present    Disposition Plan:    Inpatient Status        Time spent:   41 Minutes      Ron Parker [facility] Pager [phone]   If 7AM -7PM Please Contact the Day Rounding Team MD for [facility]  If 7PM-7AM, Please Contact Night-Floor Coverage  [email or web address] Password [email or web address] 02/20/2015, 5:02 AM     ADDENDUM:   Patient was seen and examined on 02/20/2015

## 2015-02-20 NOTE — Progress Notes (Signed)
Received report from Jessica, RN in ED.  

## 2015-02-20 NOTE — Progress Notes (Addendum)
Noticed right lower extremity greater in size compared to left lower extremity.  Pt unable to explain if this is common for her.  Dr. Butler Denmarkizwan aware.  Will continue to monitor.

## 2015-02-20 NOTE — ED Notes (Signed)
Attempted report 

## 2015-02-20 NOTE — ED Notes (Signed)
Pt tried urinating, but was unable to. Informed Jessica - RN.

## 2015-02-20 NOTE — ED Notes (Signed)
Assisted pt to the bathroom, but pt stated that she was unable to urinate or defecate.

## 2015-02-20 NOTE — ED Notes (Signed)
In and out cath performed by Kenney Housemananya - EMT and Shanda BumpsJessica - RN assisted.

## 2015-02-20 NOTE — Progress Notes (Signed)
Patient presented to US for image guided paracentesis, upon reviewing images no ascites is seen. Procedure cancelled.  Pattricia BossKoreen Mandisa Persinger PA-C Interventional Radiology  02/20/15  10:28 AM

## 2015-02-20 NOTE — ED Notes (Signed)
Placed pt on 2L of oxygen, per Shanda BumpsJessica - RN

## 2015-02-21 ENCOUNTER — Inpatient Hospital Stay (HOSPITAL_COMMUNITY): Payer: Medicaid Other

## 2015-02-21 ENCOUNTER — Ambulatory Visit: Payer: No Typology Code available for payment source | Admitting: Family Medicine

## 2015-02-21 DIAGNOSIS — R609 Edema, unspecified: Secondary | ICD-10-CM

## 2015-02-21 DIAGNOSIS — K7682 Hepatic encephalopathy: Secondary | ICD-10-CM

## 2015-02-21 DIAGNOSIS — K7460 Unspecified cirrhosis of liver: Secondary | ICD-10-CM

## 2015-02-21 DIAGNOSIS — R600 Localized edema: Secondary | ICD-10-CM

## 2015-02-21 DIAGNOSIS — K7290 Hepatic failure, unspecified without coma: Secondary | ICD-10-CM

## 2015-02-21 LAB — BASIC METABOLIC PANEL
ANION GAP: 12 (ref 5–15)
Anion gap: 10 (ref 5–15)
BUN: 18 mg/dL (ref 6–20)
BUN: 20 mg/dL (ref 6–20)
CALCIUM: 9 mg/dL (ref 8.9–10.3)
CHLORIDE: 97 mmol/L — AB (ref 101–111)
CHLORIDE: 98 mmol/L — AB (ref 101–111)
CO2: 23 mmol/L (ref 22–32)
CO2: 26 mmol/L (ref 22–32)
CREATININE: 2.91 mg/dL — AB (ref 0.44–1.00)
Calcium: 9.2 mg/dL (ref 8.9–10.3)
Creatinine, Ser: 2.78 mg/dL — ABNORMAL HIGH (ref 0.44–1.00)
GFR calc non Af Amer: 20 mL/min — ABNORMAL LOW (ref >60)
GFR calc non Af Amer: 19 mL/min — ABNORMAL LOW (ref >60)
GFR, EST AFRICAN AMERICAN: 23 mL/min — AB (ref >60)
GFR, EST AFRICAN AMERICAN: 21 mL/min — AB (ref >60)
Glucose, Bld: 105 mg/dL — ABNORMAL HIGH (ref 65–99)
Glucose, Bld: 117 mg/dL — ABNORMAL HIGH (ref 65–99)
POTASSIUM: 2.8 mmol/L — AB (ref 3.5–5.1)
Potassium: 2.7 mmol/L — CL (ref 3.5–5.1)
Sodium: 132 mmol/L — ABNORMAL LOW (ref 135–145)
Sodium: 134 mmol/L — ABNORMAL LOW (ref 135–145)

## 2015-02-21 LAB — CBC
HCT: 22.3 % — ABNORMAL LOW (ref 36.0–46.0)
HEMATOCRIT: 21.8 % — AB (ref 36.0–46.0)
HEMOGLOBIN: 7.6 g/dL — AB (ref 12.0–15.0)
HEMOGLOBIN: 7.5 g/dL — AB (ref 12.0–15.0)
MCH: 34.1 pg — AB (ref 26.0–34.0)
MCH: 34.1 pg — ABNORMAL HIGH (ref 26.0–34.0)
MCHC: 34.1 g/dL (ref 30.0–36.0)
MCHC: 34.4 g/dL (ref 30.0–36.0)
MCV: 100 fL (ref 78.0–100.0)
MCV: 99.1 fL (ref 78.0–100.0)
Platelets: 89 10*3/uL — ABNORMAL LOW (ref 150–400)
Platelets: 89 10*3/uL — ABNORMAL LOW (ref 150–400)
RBC: 2.23 MIL/uL — AB (ref 3.87–5.11)
RBC: 2.2 MIL/uL — AB (ref 3.87–5.11)
RDW: 14 % (ref 11.5–15.5)
RDW: 13.9 % (ref 11.5–15.5)
WBC: 9.1 10*3/uL (ref 4.0–10.5)
WBC: 8.6 10*3/uL (ref 4.0–10.5)

## 2015-02-21 LAB — RETICULOCYTES
RBC.: 2.19 MIL/uL — AB (ref 3.87–5.11)
Retic Count, Absolute: 78.8 10*3/uL (ref 19.0–186.0)
Retic Ct Pct: 3.6 % — ABNORMAL HIGH (ref 0.4–3.1)

## 2015-02-21 LAB — IRON AND TIBC
Iron: 97 ug/dL (ref 28–170)
SATURATION RATIOS: 37 % — AB (ref 10.4–31.8)
TIBC: 262 ug/dL (ref 250–450)
UIBC: 165 ug/dL

## 2015-02-21 LAB — OCCULT BLOOD X 1 CARD TO LAB, STOOL: Fecal Occult Bld: POSITIVE — AB

## 2015-02-21 LAB — ABO/RH: ABO/RH(D): AB POS

## 2015-02-21 LAB — VITAMIN B12: VITAMIN B 12: 1368 pg/mL — AB (ref 180–914)

## 2015-02-21 LAB — FOLATE: FOLATE: 8.5 ng/mL (ref >5.9)

## 2015-02-21 LAB — PREPARE RBC (CROSSMATCH)

## 2015-02-21 LAB — FERRITIN: Ferritin: 462 ng/mL — ABNORMAL HIGH (ref 11–307)

## 2015-02-21 LAB — MAGNESIUM: MAGNESIUM: 2.4 mg/dL (ref 1.7–2.4)

## 2015-02-21 MED ORDER — SPIRONOLACTONE 25 MG PO TABS
25.0000 mg | ORAL_TABLET | Freq: Every day | ORAL | Status: DC
  Administered 2015-02-21 – 2015-02-22 (×2): 25 mg via ORAL
  Filled 2015-02-21 (×2): qty 1

## 2015-02-21 MED ORDER — FUROSEMIDE 40 MG PO TABS
40.0000 mg | ORAL_TABLET | Freq: Once | ORAL | Status: AC
  Administered 2015-02-21: 40 mg via ORAL
  Filled 2015-02-21: qty 1

## 2015-02-21 MED ORDER — POTASSIUM CHLORIDE CRYS ER 20 MEQ PO TBCR
40.0000 meq | EXTENDED_RELEASE_TABLET | ORAL | Status: AC
  Administered 2015-02-21 (×2): 40 meq via ORAL
  Filled 2015-02-21 (×2): qty 2

## 2015-02-21 MED ORDER — SPIRONOLACTONE 100 MG PO TABS: 100.0000 mg | ORAL_TABLET | Freq: Every day | ORAL | Status: DC

## 2015-02-21 MED ORDER — LACTULOSE 10 GM/15ML PO SOLN: 30.0000 g | Freq: Two times a day (BID) | ORAL | Status: DC

## 2015-02-21 MED ORDER — POTASSIUM CHLORIDE CRYS ER 20 MEQ PO TBCR: 40.0000 meq | EXTENDED_RELEASE_TABLET | Freq: Three times a day (TID) | ORAL | Status: DC

## 2015-02-21 MED ORDER — SODIUM CHLORIDE 0.9 % IV SOLN: Freq: Once | INTRAVENOUS | Status: DC

## 2015-02-21 MED ORDER — FUROSEMIDE 40 MG PO TABS: ORAL_TABLET | ORAL | Status: DC

## 2015-02-21 NOTE — Progress Notes (Signed)
CRITICAL VALUE ALERT  Critical value received:  Potassium 2.7  Date of notification:  11/7  Time of notification:  651  Critical value read back:Yes.    Nurse who received alert:  Madelin RearLonnie Benjimin Hadden   MD notified (1st page):  Schooler   Time of first page:  653  MD notified (2nd page):  Time of second page:  Responding MD:  Butler Denmarkizwan  Time MD responded:  735 placed order for potassium

## 2015-02-21 NOTE — Progress Notes (Addendum)
[facility] Progress Note   AILEN ATKIN  UJW:[id number]  DOB: 1970-08-14  DOA: 02/19/2015 PCP: Lora Paula, MD  Brief narrative: JENIVA SWANIGAN is a 44 y.o. female with cirrhosis due to NASH and alcohol abuse, HTN, pancytopenia, seizure disorder, morbid obesity who presents with abdominal pain and mild confusion. Initially suspected to have SBP and started on IV antibiotics in the ER. Also, mentioned she was severely constipated and had tried to disimpact herself at home. Ammonia level was 64 and she admitted to being foggy, confused. Her mother was afraid to let her drive.    Subjective: No new complaints.   Assessment/Plan: Abdominal pain - started on IV antibiotics in the ER- paracentesis ordered - no ascitic fluid found- antibiotics discontinued - pain was likely due to severe constipation  Anemia - acute on chronic- no signs of acute blood loss other than a small amount of blood when she was trying to disimpact herself - will give 1 U PRBC today- check anemia panel and stool hemoccult  Constipation - disimpacted by nursing staff- given an enema and Mag citrate- had numerous BMs- abd pain resolved - was previously on Lactulose at home which she stopped on her own for unknown reasons  Hepatic encephalopathy - resolved after BMs - has been placed on lactulose- I have had a detailed discuss about the role of Lactulose in preventing hepatic encephalopathy - the patient appears to comprehend the importance of using it and the goal to have 2-3 BMs daily while using it  Cirrhosis with portal HTN - resume Lasix and Aldactone- patient recalled taking Lasix but does not recall if she was on Aldactone-   - she stopped drinking alcohol a while back  Pancytopenia - due to cirrhosis/ portal HTN  Right leg edema - patient cannot recall when this started - venous duplex negative for DVT- outpt follow up- may improve with diuretics  CKD 4 -stable - needs  outpt nephrology referral   Code Status:     Code Status Orders        Start     Ordered   02/20/15 0548  Full code   Continuous     02/20/15 0547     Family Communication:  Disposition Plan: home  DVT prophylaxis: scds Consultants: none Procedures:  Antibiotics: Anti-infectives    Start     Dose/Rate Route Frequency Ordered Stop   02/20/15 1000  cefTRIAXone (ROCEPHIN) 2 g in dextrose 5 % 50 mL IVPB  Status:  Discontinued     2 g 100 mL/hr over 30 Minutes Intravenous Every 12 hours 02/20/15 0525 02/20/15 1638   02/20/15 0445  piperacillin-tazobactam (ZOSYN) IVPB 3.375 g  Status:  Discontinued     3.375 g 100 mL/hr over 30 Minutes Intravenous  Once 02/20/15 0430 02/20/15 0524      Objective: Filed Weights   02/19/15 2147 02/20/15 0615  Weight: 104.327 kg (230 lb) 108.2 kg (238 lb 8.6 oz)    Intake/Output Summary (Last 24 hours) at 02/21/15 1318 Last data filed at 02/21/15 0900  Gross per 24 hour  Intake      0 ml  Output    600 ml  Net   -600 ml     Vitals Filed Vitals:   02/20/15 1504 02/20/15 2024 02/21/15 0018 02/21/15 0422  BP:  110/67 123/64 142/74  Pulse:  89 93 94  Temp: 98.3 F (36.8 C) 98.3 F (36.8 C) 98.8 F (37.1 C) 98.4 F (36.9 C)  TempSrc:  Oral Oral Oral Oral  Resp:  18 18 18   Height:      Weight:      SpO2:  97% 97% 97%    Exam:  General:  Pt is alert, not in acute distress  HEENT: No icterus, No thrush, oral mucosa moist  Cardiovascular: regular rate and rhythm, S1/S2 No murmur  Respiratory: clear to auscultation bilaterally   Abdomen: Soft, +Bowel sounds, non tender, non distended, no guarding  MSK: No cyanosis or clubbing + edema right leg with pitting  Data Reviewed: Basic Metabolic Panel:  Recent Labs Lab 02/19/15 2349 02/20/15 1244 02/21/15 0549 02/21/15 1150  NA 134* 135 132* 134*  K 3.1* 3.4* 2.7* 2.8*  CL 103 100* 97* 98*  CO2 20* 20* 23 26  GLUCOSE 107* 138* 105* 117*  BUN 17 19 18 20   CREATININE  2.34* 2.40* 2.78* 2.91*  CALCIUM 9.4 9.6 9.0 9.2  MG  --   --  2.4  --    Liver Function Tests:  Recent Labs Lab 02/19/15 2349  AST 30  ALT 22  ALKPHOS 53  BILITOT 4.7*  PROT 7.8  ALBUMIN 2.9*    Recent Labs Lab 02/19/15 2349  LIPASE 24    Recent Labs Lab 02/19/15 2349  AMMONIA 64*   CBC:  Recent Labs Lab 02/19/15 2349 02/20/15 1244 02/21/15 0549 02/21/15 1150  WBC 7.7 11.9* 9.1 8.6  NEUTROABS 5.1  --   --   --   HGB 8.1* 8.6* 7.6* 7.5*  HCT 22.8* 25.3* 22.3* 21.8*  MCV 97.4 100.0 100.0 99.1  PLT 90* 109* 89* 89*   Cardiac Enzymes: No results for input(s): CKTOTAL, CKMB, CKMBINDEX, TROPONINI in the last 168 hours. BNP (last 3 results) No results for input(s): BNP in the last 8760 hours.  ProBNP (last 3 results) No results for input(s): PROBNP in the last 8760 hours.  CBG: No results for input(s): GLUCAP in the last 168 hours.  Recent Results (from the past 240 hour(s))  Culture, blood (routine x 2)     Status: None (Preliminary result)   Collection Time: 02/20/15  5:00 AM  Result Value Ref Range Status   Specimen Description BLOOD RIGHT ARM  Final   Special Requests BOTTLES DRAWN AEROBIC AND ANAEROBIC 10CC  Final   Culture NO GROWTH 1 DAY  Final   Report Status PENDING  Incomplete  Culture, blood (routine x 2)     Status: None (Preliminary result)   Collection Time: 02/20/15  5:12 AM  Result Value Ref Range Status   Specimen Description BLOOD RIGHT HAND  Final   Special Requests BOTTLES DRAWN AEROBIC ONLY 5CC  Final   Culture NO GROWTH 1 DAY  Final   Report Status PENDING  Incomplete     Studies: Ct Abdomen Pelvis Wo Contrast  02/20/2015  CLINICAL DATA:  Abdominal pain. EXAM: CT ABDOMEN AND PELVIS WITHOUT CONTRAST TECHNIQUE: Multidetector CT imaging of the abdomen and pelvis was performed following the standard protocol without IV contrast. COMPARISON:  06/30/2014 FINDINGS: The liver is cirrhotic. There is a small volume ascites. There is no  evidence of small bowel obstruction. There is a large volume colonic stool. No extraluminal air. There are unremarkable unenhanced appearances of the spleen, pancreas, adrenals and kidneys. Uterus and ovaries appear unremarkable. There are multiple calculi within the gallbladder lumen. There is no bile duct dilatation. No skeletal lesions. There is no significant abnormality in the lower chest except for minimal atelectatic appearing posterior base opacities. IMPRESSION:  1. Negative for bowel obstruction. 2. Cirrhosis and small volume ascites. 3. Cholelithiasis Electronically Signed   By: Ellery Plunk M.D.   On: 02/20/2015 06:54   US Abdomen Limited  02/20/2015  CLINICAL DATA:  Small amount of ascites seen on a CT earlier today. EXAM: LIMITED ABDOMEN ULTRASOUND FOR ASCITES TECHNIQUE: Limited ultrasound survey for ascites was performed in all four abdominal quadrants. COMPARISON:  CT obtained earlier today. FINDINGS: No sonographically visible ascites. IMPRESSION: No sonographically visible ascites. Electronically Signed   By: Beckie Salts M.D.   On: 02/20/2015 12:28    Scheduled Meds:  Scheduled Meds: . sodium chloride   Intravenous Once  . lactulose  30 g Oral TID  . potassium chloride  40 mEq Oral Q4H  . sodium chloride  3 mL Intravenous Q12H   Continuous Infusions:   Time spent on care of this patient: 35 min   Dainel Arcidiacono, MD 02/21/2015, 1:18 PM  LOS: 1 day   [facility] Office  [phone] Pager - Text Page per [email or web address] If 7PM-7AM, please contact night-coverage [email or web address]

## 2015-02-21 NOTE — Progress Notes (Signed)
VASCULAR LAB PRELIMINARY  PRELIMINARY  PRELIMINARY  PRELIMINARY  Right lower extremity venous duplex completed.    Preliminary report:  There is no DVT or SVT noted in the right lower extremity.  There is interstitial fluid noted in the right calf.   Julie Rowland, RVT 02/21/2015, 8:30 AM

## 2015-02-21 NOTE — Progress Notes (Signed)
Pt. Requested to speak with MD before signing the blood consent.  Text paged Dr. Butler Denmarkizwan.  Will await for return call and continue to monitor.  Forbes Cellarelcine Carlus Stay, RN

## 2015-02-21 NOTE — Care Management Note (Signed)
Case Management Note  Patient Details  Name: Julie Rowland MRN: [id number] Date of Birth: 09/02/1970  Subjective/Objective:   Patient is for dc today, she is from home, she is indep. [facility] with Dr. Armen PickupFunches.  She has transportation at [employer]                   Action/Plan:   Expected Discharge Date:                  Expected Discharge Plan:  Home/Self Care  In-House Referral:     Discharge planning Services  CM Consult, Indigent Health Clinic  Post Acute Care Choice:    Choice offered to:     DME Arranged:    DME Agency:     HH Arranged:    HH Agency:     Status of Service:  Completed, signed off  Medicare Important Message Given:    Date Medicare IM Given:    Medicare IM give by:    Date Additional Medicare IM Given:    Additional Medicare Important Message give by:     If discussed at Long Length of Stay Meetings, dates discussed:    Additional Comments:  Leone Havenaylor, Jelena Malicoat [place], RN 02/21/2015, 1:16 PM

## 2015-02-22 ENCOUNTER — Encounter (HOSPITAL_COMMUNITY): Payer: Self-pay | Admitting: Physician Assistant

## 2015-02-22 ENCOUNTER — Ambulatory Visit: Payer: No Typology Code available for payment source | Admitting: Family Medicine

## 2015-02-22 DIAGNOSIS — I8500 Esophageal varices without bleeding: Secondary | ICD-10-CM

## 2015-02-22 DIAGNOSIS — K7030 Alcoholic cirrhosis of liver without ascites: Secondary | ICD-10-CM

## 2015-02-22 DIAGNOSIS — R195 Other fecal abnormalities: Secondary | ICD-10-CM

## 2015-02-22 LAB — TYPE AND SCREEN
ABO/RH(D): AB POS
ANTIBODY SCREEN: NEGATIVE
UNIT DIVISION: 0

## 2015-02-22 LAB — BASIC METABOLIC PANEL
Anion gap: 11 (ref 5–15)
BUN: 19 mg/dL (ref 6–20)
CHLORIDE: 102 mmol/L (ref 101–111)
CO2: 24 mmol/L (ref 22–32)
CREATININE: 2.98 mg/dL — AB (ref 0.44–1.00)
Calcium: 9.3 mg/dL (ref 8.9–10.3)
GFR calc Af Amer: 21 mL/min — ABNORMAL LOW (ref >60)
GFR calc non Af Amer: 18 mL/min — ABNORMAL LOW (ref >60)
GLUCOSE: 88 mg/dL (ref 65–99)
Potassium: 3.2 mmol/L — ABNORMAL LOW (ref 3.5–5.1)
SODIUM: 137 mmol/L (ref 135–145)

## 2015-02-22 LAB — CBC
HCT: 23.7 % — ABNORMAL LOW (ref 36.0–46.0)
Hemoglobin: 8.1 g/dL — ABNORMAL LOW (ref 12.0–15.0)
MCH: 33.3 pg (ref 26.0–34.0)
MCHC: 34.2 g/dL (ref 30.0–36.0)
MCV: 97.5 fL (ref 78.0–100.0)
PLATELETS: 92 10*3/uL — AB (ref 150–400)
RBC: 2.43 MIL/uL — ABNORMAL LOW (ref 3.87–5.11)
RDW: 15.1 % (ref 11.5–15.5)
WBC: 7 10*3/uL (ref 4.0–10.5)

## 2015-02-22 MED ORDER — ALPRAZOLAM 0.5 MG PO TABS
1.0000 mg | ORAL_TABLET | ORAL | Status: DC | PRN
Start: 2015-02-22 — End: 2015-02-22
  Filled 2015-02-22: qty 2

## 2015-02-22 MED ORDER — SODIUM CHLORIDE 0.9 % IV SOLN
INTRAVENOUS | Status: DC
  Administered 2015-02-22 – 2015-02-23 (×2): 1000 mL via INTRAVENOUS

## 2015-02-22 MED ORDER — PANTOPRAZOLE SODIUM 40 MG PO TBEC
40.0000 mg | DELAYED_RELEASE_TABLET | Freq: Every day | ORAL | Status: DC
  Administered 2015-02-23: 40 mg via ORAL
  Filled 2015-02-22: qty 1

## 2015-02-22 MED ORDER — ALPRAZOLAM 0.5 MG PO TABS
1.0000 mg | ORAL_TABLET | Freq: Two times a day (BID) | ORAL | Status: DC | PRN
  Administered 2015-02-22 – 2015-02-23 (×2): 1 mg via ORAL
  Filled 2015-02-22: qty 2

## 2015-02-22 MED ORDER — LACTULOSE 10 GM/15ML PO SOLN
10.0000 g | Freq: Two times a day (BID) | ORAL | Status: DC
  Administered 2015-02-22 – 2015-02-23 (×2): 10 g via ORAL
  Filled 2015-02-22 (×2): qty 15

## 2015-02-22 MED ORDER — LACTULOSE 10 GM/15ML PO SOLN
20.0000 g | Freq: Three times a day (TID) | ORAL | Status: DC
  Administered 2015-02-22: 20 g via ORAL
  Filled 2015-02-22: qty 30

## 2015-02-22 MED ORDER — POTASSIUM CHLORIDE CRYS ER 20 MEQ PO TBCR
40.0000 meq | EXTENDED_RELEASE_TABLET | ORAL | Status: AC
  Administered 2015-02-22 (×2): 40 meq via ORAL
  Filled 2015-02-22 (×2): qty 2

## 2015-02-22 MED ORDER — FUROSEMIDE 40 MG PO TABS
40.0000 mg | ORAL_TABLET | Freq: Once | ORAL | Status: AC
  Administered 2015-02-22: 40 mg via ORAL
  Filled 2015-02-22: qty 1

## 2015-02-22 NOTE — Progress Notes (Signed)
CSW received call from pt mom requesting CSW help in getting medical records to patients probation officer- per pt mom pt is being evaluated to be taken off probation.  CSW spoke with pt and received verbal and written permission to speak with her probation officer- Officer Tresa EndoKelly- and provide with medical information explaining pt current condition  CSW contacted officer and provided requested information.  No further CSW needs identified at this time  CSW signing off  Merlyn LotJenna Holoman, [facility] Clinical Social Worker [phone]

## 2015-02-22 NOTE — Progress Notes (Addendum)
[facility] Progress Note   CREINA CARLEO  HQI:[id number]  DOB: Sep 02, 1970  DOA: 02/19/2015 PCP: Lora Paula, MD  Brief narrative: Julie Rowland is a 44 y.o. female with cirrhosis due to NASH and alcohol abuse, HTN, pancytopenia, seizure disorder, morbid obesity who presents with abdominal pain and mild confusion. Initially suspected to have SBP and started on IV antibiotics in the ER. Also, mentioned she was severely constipated and had tried to disimpact herself at home. Ammonia level was 64 and she admitted to being foggy, confused. Her mother was afraid to let her drive.    Subjective: No new complaints. No nausea, vomiting. Had 4 loose stools yesterday.   Assessment/Plan: Abdominal pain/Constipation - started on IV antibiotics in the ER- paracentesis ordered - no ascitic fluid found- antibiotics discontinued - pain was likely due to severe constipation - disimpacted by nursing staff- given an enema and Mag citrate- had numerous BMs- abd pain resolved - was previously on Lactulose at home which she stopped on her own for unknown reasons  Anemia - acute on chronic- no signs of acute blood loss other than a small amount of blood when she was trying to disimpact herself - 11/7- given 1 U PRBC- checked anemia panel- reveals AOCD - stool hemoccult + and therefore GI consulted  Hemoccult + stool - banding of grade 3 varices done a few wks ago in [place] - have asked for a GI eval  ARF on CKD 3 - urine output decreasing- hold diuretics, start NS 75 cc/hr and follow renal function  Hypokalemia - replace and follow  Hepatic encephalopathy - resolved after BMs - has been placed on lactulose- I have had a detailed discuss about the role of Lactulose in preventing hepatic encephalopathy - the patient appears to comprehend the importance of using it and the goal to have 2-3 BMs daily while using it  Cirrhosis with portal HTN - will hold Lasix and  Aldactone until renal function improves - patient recalled taking Lasix but does not recall if she was on Aldactone-   - she stopped drinking alcohol a while back  Pancytopenia - due to cirrhosis/ portal HTN  Right leg edema - patient cannot recall when this started - venous duplex negative for DVT- outpt follow up- may improve with diuretics   Code Status:     Code Status Orders        Start     Ordered   02/20/15 0548  Full code   Continuous     02/20/15 0547     Family Communication:  Disposition Plan: f/u on GI eval and renal function for improvement DVT prophylaxis: scds Consultants: none Procedures:  Antibiotics: Anti-infectives    Start     Dose/Rate Route Frequency Ordered Stop   02/20/15 1000  cefTRIAXone (ROCEPHIN) 2 g in dextrose 5 % 50 mL IVPB  Status:  Discontinued     2 g 100 mL/hr over 30 Minutes Intravenous Every 12 hours 02/20/15 0525 02/20/15 1638   02/20/15 0445  piperacillin-tazobactam (ZOSYN) IVPB 3.375 g  Status:  Discontinued     3.375 g 100 mL/hr over 30 Minutes Intravenous  Once 02/20/15 0430 02/20/15 0524      Objective: Filed Weights   02/19/15 2147 02/20/15 0615  Weight: 104.327 kg (230 lb) 108.2 kg (238 lb 8.6 oz)    Intake/Output Summary (Last 24 hours) at 02/22/15 1431 Last data filed at 02/22/15 1238  Gross per 24 hour  Intake   1125 ml  Output    300 ml  Net    825 ml     Vitals Filed Vitals:   02/21/15 2150 02/22/15 0015 02/22/15 0515 02/22/15 1430  BP: 128/62 136/69 117/64 130/64  Pulse: 87  88 86  Temp: 98.5 F (36.9 C) 98.5 F (36.9 C) 98.2 F (36.8 C) 98.5 F (36.9 C)  TempSrc: Oral Oral Oral Oral  Resp: 18  18 16   Height:      Weight:      SpO2: 99% 95% 96% 100%    Exam:  General:  Pt is alert, not in acute distress  HEENT: No icterus, No thrush, oral mucosa moist  Cardiovascular: regular rate and rhythm, S1/S2 No murmur  Respiratory: clear to auscultation bilaterally   Abdomen: Soft, +Bowel  sounds, non tender, non distended, no guarding  MSK: No cyanosis or clubbing + edema right leg with pitting  Data Reviewed: Basic Metabolic Panel:  Recent Labs Lab 02/19/15 2349 02/20/15 1244 02/21/15 0549 02/21/15 1150 02/22/15 0515  NA 134* 135 132* 134* 137  K 3.1* 3.4* 2.7* 2.8* 3.2*  CL 103 100* 97* 98* 102  CO2 20* 20* 23 26 24   GLUCOSE 107* 138* 105* 117* 88  BUN 17 19 18 20 19   CREATININE 2.34* 2.40* 2.78* 2.91* 2.98*  CALCIUM 9.4 9.6 9.0 9.2 9.3  MG  --   --  2.4  --   --    Liver Function Tests:  Recent Labs Lab 02/19/15 2349  AST 30  ALT 22  ALKPHOS 53  BILITOT 4.7*  PROT 7.8  ALBUMIN 2.9*    Recent Labs Lab 02/19/15 2349  LIPASE 24    Recent Labs Lab 02/19/15 2349  AMMONIA 64*   CBC:  Recent Labs Lab 02/19/15 2349 02/20/15 1244 02/21/15 0549 02/21/15 1150 02/22/15 0515  WBC 7.7 11.9* 9.1 8.6 7.0  NEUTROABS 5.1  --   --   --   --   HGB 8.1* 8.6* 7.6* 7.5* 8.1*  HCT 22.8* 25.3* 22.3* 21.8* 23.7*  MCV 97.4 100.0 100.0 99.1 97.5  PLT 90* 109* 89* 89* 92*   Cardiac Enzymes: No results for input(s): CKTOTAL, CKMB, CKMBINDEX, TROPONINI in the last 168 hours. BNP (last 3 results) No results for input(s): BNP in the last 8760 hours.  ProBNP (last 3 results) No results for input(s): PROBNP in the last 8760 hours.  CBG: No results for input(s): GLUCAP in the last 168 hours.  Recent Results (from the past 240 hour(s))  Culture, blood (routine x 2)     Status: None (Preliminary result)   Collection Time: 02/20/15  5:00 AM  Result Value Ref Range Status   Specimen Description BLOOD RIGHT ARM  Final   Special Requests BOTTLES DRAWN AEROBIC AND ANAEROBIC 10CC  Final   Culture NO GROWTH 2 DAYS  Final   Report Status PENDING  Incomplete  Culture, blood (routine x 2)     Status: None (Preliminary result)   Collection Time: 02/20/15  5:12 AM  Result Value Ref Range Status   Specimen Description BLOOD RIGHT HAND  Final   Special Requests  BOTTLES DRAWN AEROBIC ONLY 5CC  Final   Culture NO GROWTH 2 DAYS  Final   Report Status PENDING  Incomplete     Studies: No results found.  Scheduled Meds:  Scheduled Meds: . sodium chloride   Intravenous Once  . lactulose  10 g Oral BID  . sodium chloride  3 mL Intravenous Q12H   Continuous Infusions:  Time spent on care of this patient: 35 min   Karista Aispuro, MD 02/22/2015, 2:31 PM  LOS: 2 days   [facility] Office  [phone] Pager - Text Page per [email or web address] If 7PM-7AM, please contact night-coverage [email or web address]
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
## 2015-02-23 ENCOUNTER — Inpatient Hospital Stay (HOSPITAL_COMMUNITY): Payer: Medicaid Other | Admitting: Anesthesiology

## 2015-02-23 ENCOUNTER — Encounter (HOSPITAL_COMMUNITY): Payer: Self-pay | Admitting: Anesthesiology

## 2015-02-23 ENCOUNTER — Encounter (HOSPITAL_COMMUNITY)
Admission: EM | Disposition: A | Discharge status: Home / Self Care | Payer: Self-pay | Source: Home / Self Care | Attending: Internal Medicine

## 2015-02-23 DIAGNOSIS — R109 Unspecified abdominal pain: Secondary | ICD-10-CM

## 2015-02-23 DIAGNOSIS — D62 Acute posthemorrhagic anemia: Secondary | ICD-10-CM | POA: Insufficient documentation

## 2015-02-23 DIAGNOSIS — K7290 Hepatic failure, unspecified without coma: Principal | ICD-10-CM

## 2015-02-23 DIAGNOSIS — G9340 Encephalopathy, unspecified: Secondary | ICD-10-CM

## 2015-02-23 DIAGNOSIS — I10 Essential (primary) hypertension: Secondary | ICD-10-CM

## 2015-02-23 DIAGNOSIS — K7031 Alcoholic cirrhosis of liver with ascites: Secondary | ICD-10-CM

## 2015-02-23 HISTORY — PX: ESOPHAGOGASTRODUODENOSCOPY (EGD) WITH PROPOFOL: SHX5813

## 2015-02-23 LAB — CBC
HCT: 22.5 % — ABNORMAL LOW (ref 36.0–46.0)
HEMOGLOBIN: 7.7 g/dL — AB (ref 12.0–15.0)
MCH: 33.6 pg (ref 26.0–34.0)
MCHC: 34.2 g/dL (ref 30.0–36.0)
MCV: 98.3 fL (ref 78.0–100.0)
Platelets: 87 10*3/uL — ABNORMAL LOW (ref 150–400)
RBC: 2.29 MIL/uL — AB (ref 3.87–5.11)
RDW: 14.9 % (ref 11.5–15.5)
WBC: 6 10*3/uL (ref 4.0–10.5)

## 2015-02-23 LAB — BASIC METABOLIC PANEL
ANION GAP: 10 (ref 5–15)
BUN: 21 mg/dL — ABNORMAL HIGH (ref 6–20)
CHLORIDE: 104 mmol/L (ref 101–111)
CO2: 21 mmol/L — ABNORMAL LOW (ref 22–32)
CREATININE: 2.91 mg/dL — AB (ref 0.44–1.00)
Calcium: 8.8 mg/dL — ABNORMAL LOW (ref 8.9–10.3)
GFR calc non Af Amer: 19 mL/min — ABNORMAL LOW (ref >60)
GFR, EST AFRICAN AMERICAN: 21 mL/min — AB (ref >60)
Glucose, Bld: 90 mg/dL (ref 65–99)
POTASSIUM: 3.3 mmol/L — AB (ref 3.5–5.1)
SODIUM: 135 mmol/L (ref 135–145)

## 2015-02-23 SURGERY — ESOPHAGOGASTRODUODENOSCOPY (EGD) WITH PROPOFOL
Anesthesia: Monitor Anesthesia Care

## 2015-02-23 MED ORDER — PROPOFOL 500 MG/50ML IV EMUL
INTRAVENOUS | Status: DC | PRN
  Administered 2015-02-23: 75 ug/kg/min via INTRAVENOUS

## 2015-02-23 MED ORDER — PANTOPRAZOLE SODIUM 40 MG PO TBEC
40.0000 mg | DELAYED_RELEASE_TABLET | Freq: Two times a day (BID) | ORAL | Status: DC
  Administered 2015-02-23: 40 mg via ORAL
  Filled 2015-02-23: qty 1

## 2015-02-23 MED ORDER — FENTANYL CITRATE (PF) 100 MCG/2ML IJ SOLN
INTRAMUSCULAR | Status: DC | PRN
  Administered 2015-02-23: 100 ug via INTRAVENOUS
  Administered 2015-02-23: 50 ug via INTRAVENOUS
  Administered 2015-02-23: 100 ug via INTRAVENOUS

## 2015-02-23 MED ORDER — SUCRALFATE 1 GM/10ML PO SUSP: 1.0000 g | Freq: Four times a day (QID) | ORAL | Status: DC

## 2015-02-23 MED ORDER — PANTOPRAZOLE SODIUM 40 MG PO TBEC: 40.0000 mg | DELAYED_RELEASE_TABLET | Freq: Two times a day (BID) | ORAL | Status: DC

## 2015-02-23 MED ORDER — LACTULOSE 10 GM/15ML PO SOLN: 10.0000 g | Freq: Two times a day (BID) | ORAL | Status: DC

## 2015-02-23 MED ORDER — LIDOCAINE HCL (CARDIAC) 20 MG/ML IV SOLN
INTRAVENOUS | Status: DC | PRN
  Administered 2015-02-23: 50 mg via INTRAVENOUS

## 2015-02-23 MED ORDER — SUCRALFATE 1 GM/10ML PO SUSP
1.0000 g | Freq: Four times a day (QID) | ORAL | Status: DC
  Administered 2015-02-23: 1 g via ORAL
  Filled 2015-02-23: qty 10

## 2015-02-23 MED ORDER — MIDAZOLAM HCL 5 MG/5ML IJ SOLN
INTRAMUSCULAR | Status: DC | PRN
  Administered 2015-02-23: 2 mg via INTRAVENOUS

## 2015-02-23 NOTE — Consult Note (Signed)
Julie Rowland 
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
## 2015-02-23 NOTE — Progress Notes (Signed)
NURSING PROGRESS NOTE  Julie MerlMichelle L Wenzl [id number] Discharge Data: 02/23/2015 8:27 PM Attending Provider: No att. providers found [id number]:FUNCHES, Ellison CarwinJOSALYN C, MD     Julie MerlMichelle L Armitage to be D/C'd Home per MD order.  Discussed with the patient the After Visit Summary and all questions fully answered. All IV's discontinued with no bleeding noted. All belongings returned to patient for patient to take home.   Last Vital Signs:  Blood pressure 128/64, pulse 79, temperature 98.6 F (37 C), temperature source Oral, resp. rate 17, height 5\' 9"  (1.753 m), weight 108.2 kg (238 lb 8.6 oz), SpO2 100 %.  Discharge Medication List   Medication List    STOP taking these medications        ciprofloxacin 500 MG tablet  Commonly known as:  CIPRO      TAKE these medications        ALPRAZolam 1 MG tablet  Commonly known as:  XANAX  Take 1 mg by mouth as needed for anxiety.     cyclobenzaprine 10 MG tablet  Commonly known as:  FLEXERIL  Take 1 tablet (10 mg total) by mouth 2 (two) times daily as needed for muscle spasms (caution will cause drowsiness). For headache per patient     furosemide 40 MG tablet  Commonly known as:  LASIX  WITH ALDACTONE 100 MG DAILY     lactulose 10 GM/15ML solution  Commonly known as:  CHRONULAC  Take 15 mLs (10 g total) by mouth 2 (two) times daily.     pantoprazole 40 MG tablet  Commonly known as:  PROTONIX  Take 1 tablet (40 mg total) by mouth 2 (two) times daily.     spironolactone 100 MG tablet  Commonly known as:  ALDACTONE  Take 1 tablet (100 mg total) by mouth daily.     sucralfate 1 GM/10ML suspension  Commonly known as:  CARAFATE  Take 10 mLs (1 g total) by mouth every 6 (six) hours.     zolpidem 10 MG tablet  Commonly known as:  AMBIEN  Take 10 mg by mouth at bedtime as needed for sleep.         Bennie Pieriniyndi Mischa Brittingham, RN
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
## 2015-02-23 NOTE — Op Note (Signed)
[facility] [address] [place], [id number]   ENDOSCOPY PROCEDURE REPORT  PATIENT: Julie Rowland, Julie L  MR#: [id number] BIRTHDATE: 03-31-1971 , 44  yrs. old GENDER: female ENDOSCOPIST: Benancio DeedsSteven P Armbruster, MD REFERRED BY: PROCEDURE DATE:  02/23/2015 PROCEDURE:  EGD, diagnostic ASA CLASS:     Class III INDICATIONS:  hemocult positive stool and anemia. MEDICATIONS: Per Anesthesia TOPICAL ANESTHETIC:  DESCRIPTION OF PROCEDURE: After the risks benefits and alternatives of the procedure were thoroughly explained, informed consent was obtained.  The    endoscope was introduced through the mouth and advanced to the second portion of the duodenum , Without limitations.  The instrument was slowly withdrawn as the mucosa was fully examined.  FINDINGS: The esophagus had 3 clean based ulcerations in the mid to lower esophagus, representing sites of prior esophageal banding for varices.  The ulcerations were roughly 1cm in size each, without any high risk stigmata of bleeding.  Esophageal varices were otherwise noted but did not appear large, and deflated with insufflation of the esophagus.  The DH, GEJ, and SCJ were located 40cm from the incisors.  There was evidenence of portal hypertensive gastropathy throughout the examined stomach, but no focal ulcerations or erosions were noted.  The duodenal bulb and 2nd portion of the duodenum were otherwise normal.  Retroflexed views revealed no abnormalities.     The scope was then withdrawn from the patient and the procedure completed.  COMPLICATIONS: There were no immediate complications.  ENDOSCOPIC IMPRESSION: Post-esophageal banding ulcerations noted in the esophagus, clean based without stigmata of bleeding Portal hypertensive gastropathy Normal duodenum  Patient has chronic anemia, suspect more likely due to portal hypertensive gastropathy, perhaps with some recent oozing from esophageal ulcers, but  no evidence of bleeding on exam today nor was there high risk stigmata for bleeding  RECOMMENDATIONS: Return to medical ward Increase protonix to 40mg  twice daily Add liquid carafate suspension 10cc po q 6 hours for treatment of esophageal ulcerations Consideration for beta blocker (nadolol) in the future if renal function / blood pressure allow Continue lactulose and diuretics Patient to follow up with primary GI in upcoming weeks for further esophageal varix banding as needed    eSigned:  Benancio DeedsSteven P Armbruster, MD 02/23/2015 9:38 AM    CC:  PATIENT NAME:  Julie Rowland, Julie L MR#: [id number]

## 2015-02-23 NOTE — Anesthesia Preprocedure Evaluation (Signed)
Anesthesia Evaluation  Patient identified by MRN, date of birth, ID band Patient awake    Reviewed: Allergy & Precautions, NPO status , Patient's Chart, lab work & pertinent test results  History of Anesthesia Complications Negative for: history of anesthetic complications  Airway Mallampati: II  TM Distance: >3 FB Neck ROM: Full    Dental  (+) Teeth Intact   Pulmonary neg shortness of breath, neg sleep apnea, neg COPD, neg recent URI, former smoker,    breath sounds clear to auscultation       Cardiovascular hypertension, + Peripheral Vascular Disease   Rhythm:Regular     Neuro/Psych  Headaches, Seizures -,  Anxiety Depression    GI/Hepatic (+) Cirrhosis       , GI bleed?   Endo/Other  Morbid obesity  Renal/GU negative Renal ROS     Musculoskeletal   Abdominal   Peds  Hematology  (+) anemia ,   Anesthesia Other Findings   Reproductive/Obstetrics                             Anesthesia Physical Anesthesia Plan  ASA: III  Anesthesia Plan: MAC   Post-op Pain Management:    Induction: Intravenous  Airway Management Planned: Nasal Cannula, Natural Airway and Simple Face Mask  Additional Equipment: None  Intra-op Plan:   Post-operative Plan:   Informed Consent: I have reviewed the patients History and Physical, chart, labs and discussed the procedure including the risks, benefits and alternatives for the proposed anesthesia with the patient or authorized representative who has indicated his/her understanding and acceptance.   Dental advisory given  Plan Discussed with: CRNA and Surgeon  Anesthesia Plan Comments:         Anesthesia Quick Evaluation

## 2015-02-23 NOTE — Transfer of Care (Signed)
Immediate Anesthesia Transfer of Care Note  Patient: Julie Rowland  Procedure(s) Performed: Procedure(s): ESOPHAGOGASTRODUODENOSCOPY (EGD) WITH PROPOFOL (N/A)  Patient Location: PACU and Endoscopy Unit  Anesthesia Type:MAC  Level of Consciousness: awake, alert , oriented and patient cooperative  Airway & Oxygen Therapy: Patient Spontanous Breathing and Patient connected to nasal cannula oxygen  Post-op Assessment: Report given to RN, Post -op Vital signs reviewed and stable and Patient moving all extremities X 4  Post vital signs: Reviewed and stable  Last Vitals:  Filed Vitals:   02/23/15 0935  BP: 125/61  Pulse: 89  Temp: 36.7 C  Resp: 16    Complications: No apparent anesthesia complications

## 2015-02-23 NOTE — Interval H&P Note (Signed)
History and Physical Interval Note:  02/23/2015 9:02 AM  Julie Rowland  has presented today for surgery, with the diagnosis of anemia,  FOBT +  The various methods of treatment have been discussed with the patient and family. After consideration of risks, benefits and other options for treatment, the patient has consented to  Procedure(s): ESOPHAGOGASTRODUODENOSCOPY (EGD) WITH PROPOFOL (N/A) as a surgical intervention .  The patient's history has been reviewed, patient examined, no change in status, stable for surgery.  I have reviewed the patient's chart and labs.  Questions were answered to the patient's satisfaction.     Reeves ForthSteven Paul Fillmore Bynum

## 2015-02-24 ENCOUNTER — Encounter (HOSPITAL_COMMUNITY): Payer: Self-pay | Admitting: Gastroenterology

## 2015-02-24 NOTE — Anesthesia Postprocedure Evaluation (Signed)
  Anesthesia Post-op Note  Patient: Julie Rowland  Procedure(s) Performed: Procedure(s): ESOPHAGOGASTRODUODENOSCOPY (EGD) WITH PROPOFOL (N/A)  Patient Location: Endoscopy Unit  Anesthesia Type:MAC  Level of Consciousness: awake  Airway and Oxygen Therapy: Patient Spontanous Breathing  Post-op Pain: none  Post-op Assessment: Post-op Vital signs reviewed, Patient's Cardiovascular Status Stable, Respiratory Function Stable, Patent Airway, No signs of Nausea or vomiting and Pain level controlled              Post-op Vital Signs: Reviewed and stable  Last Vitals:  Filed Vitals:   02/23/15 1200  BP:   Pulse: 79  Temp: 37 C  Resp: 17    Complications: No apparent anesthesia complications

## 2015-02-25 ENCOUNTER — Ambulatory Visit: Payer: No Typology Code available for payment source | Attending: Family Medicine

## 2015-02-25 DIAGNOSIS — E7220 Disorder of urea cycle metabolism, unspecified: Secondary | ICD-10-CM

## 2015-02-25 DIAGNOSIS — R7989 Other specified abnormal findings of blood chemistry: Secondary | ICD-10-CM

## 2015-02-25 DIAGNOSIS — K7581 Nonalcoholic steatohepatitis (NASH): Secondary | ICD-10-CM

## 2015-02-25 LAB — BASIC METABOLIC PANEL
BUN: 25 mg/dL (ref 7–25)
CALCIUM: 9.1 mg/dL (ref 8.6–10.2)
CHLORIDE: 99 mmol/L (ref 98–110)
CO2: 24 mmol/L (ref 20–31)
CREATININE: 2.61 mg/dL — AB (ref 0.50–1.10)
Glucose, Bld: 115 mg/dL — ABNORMAL HIGH (ref 65–99)
Potassium: 3.7 mmol/L (ref 3.5–5.3)
Sodium: 138 mmol/L (ref 135–146)

## 2015-02-25 LAB — CULTURE, BLOOD (ROUTINE X 2)
CULTURE: NO GROWTH
CULTURE: NO GROWTH

## 2015-02-25 LAB — COMPLETE METABOLIC PANEL WITH GFR
ALT: 21 U/L (ref 6–29)
AST: 39 U/L — AB (ref 10–30)
Albumin: 2.9 g/dL — ABNORMAL LOW (ref 3.6–5.1)
Alkaline Phosphatase: 21 U/L — ABNORMAL LOW (ref 33–115)
BUN: 25 mg/dL (ref 7–25)
CALCIUM: 9.1 mg/dL (ref 8.6–10.2)
CHLORIDE: 99 mmol/L (ref 98–110)
CO2: 24 mmol/L (ref 20–31)
CREATININE: 2.61 mg/dL — AB (ref 0.50–1.10)
GFR, EST AFRICAN AMERICAN: 25 mL/min — AB (ref >=60)
GFR, Est Non African American: 22 mL/min — ABNORMAL LOW (ref >=60)
Glucose, Bld: 115 mg/dL — ABNORMAL HIGH (ref 65–99)
POTASSIUM: 3.7 mmol/L (ref 3.5–5.3)
Sodium: 138 mmol/L (ref 135–146)
Total Bilirubin: 2.9 mg/dL — ABNORMAL HIGH (ref 0.2–1.2)
Total Protein: 7.5 g/dL (ref 6.1–8.1)

## 2015-02-25 LAB — AMMONIA: AMMONIA: 79 umol/L — AB (ref 16–53)

## 2015-02-28 ENCOUNTER — Other Ambulatory Visit: Payer: Self-pay | Admitting: Family Medicine

## 2015-02-28 DIAGNOSIS — R7989 Other specified abnormal findings of blood chemistry: Secondary | ICD-10-CM

## 2015-02-28 MED ORDER — LACTULOSE 10 GM/15ML PO SOLN: 20.0000 g | Freq: Two times a day (BID) | ORAL | Status: DC

## 2015-03-02 ENCOUNTER — Telehealth: Payer: Self-pay | Admitting: *Deleted

## 2015-03-02 DIAGNOSIS — R1030 Lower abdominal pain, unspecified: Secondary | ICD-10-CM

## 2015-03-02 NOTE — Telephone Encounter (Signed)
-----   Message from Dessa PhiJosalyn Funches, MD sent at 02/28/2015  1:14 PM EST ----- Ammonia is elevated increase lactulose to 20 grams (30 ml) BID if already doing 15 ml BID Cr is stable but still elevated, renal ultrasound ordered to check on size of kidneys, renal referral placed

## 2015-03-02 NOTE — Telephone Encounter (Signed)
LVM to return call.

## 2015-03-03 ENCOUNTER — Encounter (HOSPITAL_BASED_OUTPATIENT_CLINIC_OR_DEPARTMENT_OTHER): Payer: No Typology Code available for payment source | Admitting: Clinical

## 2015-03-03 ENCOUNTER — Ambulatory Visit: Payer: Medicaid Other | Attending: Family Medicine | Admitting: Family Medicine

## 2015-03-03 ENCOUNTER — Encounter: Payer: Self-pay | Admitting: Family Medicine

## 2015-03-03 VITALS — BP 137/79 | HR 78 | Temp 98.0°F | Resp 16 | Ht 69.0 in | Wt 224.0 lb

## 2015-03-03 DIAGNOSIS — F329 Major depressive disorder, single episode, unspecified: Secondary | ICD-10-CM | POA: Diagnosis not present

## 2015-03-03 DIAGNOSIS — R410 Disorientation, unspecified: Secondary | ICD-10-CM | POA: Diagnosis not present

## 2015-03-03 DIAGNOSIS — F419 Anxiety disorder, unspecified: Principal | ICD-10-CM

## 2015-03-03 DIAGNOSIS — R109 Unspecified abdominal pain: Secondary | ICD-10-CM | POA: Insufficient documentation

## 2015-03-03 DIAGNOSIS — K7460 Unspecified cirrhosis of liver: Secondary | ICD-10-CM | POA: Insufficient documentation

## 2015-03-03 DIAGNOSIS — G4700 Insomnia, unspecified: Secondary | ICD-10-CM | POA: Diagnosis not present

## 2015-03-03 DIAGNOSIS — D649 Anemia, unspecified: Secondary | ICD-10-CM | POA: Insufficient documentation

## 2015-03-03 DIAGNOSIS — I8500 Esophageal varices without bleeding: Secondary | ICD-10-CM | POA: Insufficient documentation

## 2015-03-03 DIAGNOSIS — Z87891 Personal history of nicotine dependence: Secondary | ICD-10-CM | POA: Diagnosis not present

## 2015-03-03 DIAGNOSIS — K5900 Constipation, unspecified: Secondary | ICD-10-CM | POA: Diagnosis not present

## 2015-03-03 DIAGNOSIS — Z79899 Other long term (current) drug therapy: Secondary | ICD-10-CM | POA: Insufficient documentation

## 2015-03-03 DIAGNOSIS — D638 Anemia in other chronic diseases classified elsewhere: Secondary | ICD-10-CM | POA: Diagnosis not present

## 2015-03-03 DIAGNOSIS — F32A Depression, unspecified: Secondary | ICD-10-CM

## 2015-03-03 DIAGNOSIS — D696 Thrombocytopenia, unspecified: Secondary | ICD-10-CM | POA: Insufficient documentation

## 2015-03-03 DIAGNOSIS — F418 Other specified anxiety disorders: Secondary | ICD-10-CM

## 2015-03-03 MED ORDER — CYCLOBENZAPRINE HCL 10 MG PO TABS: 10.0000 mg | ORAL_TABLET | Freq: Two times a day (BID) | ORAL | Status: DC | PRN

## 2015-03-03 MED ORDER — ZOLPIDEM TARTRATE 10 MG PO TABS: 10.0000 mg | ORAL_TABLET | Freq: Every evening | ORAL | Status: DC | PRN

## 2015-03-03 MED ORDER — SERTRALINE HCL 25 MG PO TABS: 25.0000 mg | ORAL_TABLET | Freq: Every day | ORAL | Status: DC

## 2015-03-03 NOTE — Patient Instructions (Addendum)
Marcelino DusterMichelle was seen today for abdominal pain.  Diagnoses and all orders for this visit:  Anemia, chronic disease -     CBC  Thrombocytopenia (HCC) -     CBC  Esophageal varices determined by endoscopy (HCC)  Abdominal pain, unspecified abdominal location -     cyclobenzaprine (FLEXERIL) 10 MG tablet; Take 1 tablet (10 mg total) by mouth 2 (two) times daily as needed for muscle spasms (caution will cause drowsiness).  Insomnia -     zolpidem (AMBIEN) 10 MG tablet; Take 1 tablet (10 mg total) by mouth at bedtime as needed for sleep.  Depression -     sertraline (ZOLOFT) 25 MG tablet; Take 1 tablet (25 mg total) by mouth daily.   F/u in 4 weeks for depression  Dr. Armen PickupFunches

## 2015-03-03 NOTE — Progress Notes (Signed)
ASSESSMENT: Pt experiencing symptoms of anxiety and depression. [facility]; may benefit from psychoeducation and supportive counseling regarding coping with symptoms of anxiety and depression.  Stage of Change: precontemplative  PLAN: 1. F/U with behavioral health consultant in one month 2. Psychiatric Medications: Xanax, Ambien 3. Behavioral recommendation(s):   -Consider educational material regarding coping with depression and anxiety  SUBJECTIVE: Pt. referred by Dr Armen PickupFunches for symptoms of anxiety and depression:  Pt. reports the following symptoms/concerns: Pt states that she  Duration of problem: at least one month Severity: moderate  OBJECTIVE: Orientation & Cognition: Oriented x3. Thought processes normal and appropriate to situation. Mood: teary. Affect: appropriate Appearance: appropriate Risk of harm to self or others: no known risk of harm to self or others Substance use: none Assessments administered: BHC9: 11/ GAD7: 13  Diagnosis: Anxiety and depression CPT Code: F41.8 -------------------------------------------- Other(s) present in the room: mother  Time spent with patient in exam room: 12 minutes

## 2015-03-03 NOTE — Progress Notes (Signed)
HFU abdominal pain No pain today  No Suicide thought in the past to weeks

## 2015-03-03 NOTE — Progress Notes (Signed)
Patient ID: Julie Rowland, female   DOB: 1970-07-16, 44 y.o.   MRN: [id number]   Subjective:  Patient ID: Julie Rowland, female    DOB: Aug 08, 1970  Age: 44 y.o. MRN: [id number]  CC: Abdominal Pain   HPI Julie Rowland presents for HFU abdominal pain, constipation, confusion. She presents today with her mother   1. HFU: she was hospitalized from 11/5-11/12/2014 for abdominal pain, constipation and confusion. Ammonia level 64, down from previous level of 84.  She has anemia and thrombocytopenia.  She had CT abdomen done on 02/20/2015 that revealed cirrhosis with small volume ascites, gallstones. Negative for bowel obstruction. She is compliant with regimen. Is now taking 10 g of lactulose BID.  2. Depressed mood: depressed because she cannot work now. She is often confused. She is living at home.   Social History  Substance Use Topics  . Smoking status: Former Smoker -- 20 years    Types: Cigarettes    Quit date: 08/02/2014  . Smokeless tobacco: Never Used  . Alcohol Use: 0.0 oz/week    0 Standard drinks or equivalent per week     Comment: Has not had ETOH in 09/08/14    Outpatient Prescriptions Prior to Visit  Medication Sig Dispense Refill  . ALPRAZolam (XANAX) 1 MG tablet Take 1 mg by mouth as needed for anxiety.    . cyclobenzaprine (FLEXERIL) 10 MG tablet Take 1 tablet (10 mg total) by mouth 2 (two) times daily as needed for muscle spasms (caution will cause drowsiness). For headache per patient 45 tablet 1  . furosemide (LASIX) 40 MG tablet WITH ALDACTONE 100 MG DAILY 30 tablet 11  . lactulose (CHRONULAC) 10 GM/15ML solution Take 30 mLs (20 g total) by mouth 2 (two) times daily. 240 mL 3  . pantoprazole (PROTONIX) 40 MG tablet Take 1 tablet (40 mg total) by mouth 2 (two) times daily. 60 tablet 0  . spironolactone (ALDACTONE) 100 MG tablet Take 1 tablet (100 mg total) by mouth daily. 30 tablet 11  . sucralfate (CARAFATE) 1 GM/10ML suspension Take 10 mLs (1 g  total) by mouth every 6 (six) hours. 420 mL 0  . zolpidem (AMBIEN) 10 MG tablet Take 10 mg by mouth at bedtime as needed for sleep.     No facility-administered medications prior to visit.    ROS Review of Systems  Constitutional: Negative for fever and chills.  Eyes: Negative for visual disturbance.  Respiratory: Negative for shortness of breath.   Cardiovascular: Positive for leg swelling (R leg, R ankle ). Negative for chest pain.  Gastrointestinal: Negative for abdominal pain and blood in stool.  Musculoskeletal: Negative for back pain and arthralgias.  Skin: Negative for rash.  Allergic/Immunologic: Negative for immunocompromised state.  Hematological: Negative for adenopathy. Does not bruise/bleed easily.  Psychiatric/Behavioral: Negative for suicidal ideas and dysphoric mood.    Objective:  BP 137/79 mmHg  Pulse 78  Temp(Src) 98 F (36.7 C) (Oral)  Resp 16  Ht 5\' 9"  (1.753 m)  Wt 224 lb (101.606 kg)  BMI 33.06 kg/m2  SpO2 99%  LMP  (Approximate)  BP/Weight 03/03/2015 02/23/2015 02/20/2015  Systolic BP 137 128 -  Diastolic BP 79 64 -  Wt. (Lbs) 224 - 238.54  BMI 33.06 - 35.21   Physical Exam  Constitutional: She is oriented to person, place, and time. She appears well-developed and well-nourished. No distress.  HENT:  Head: Normocephalic and atraumatic.  Cardiovascular: Normal rate, regular rhythm, normal heart sounds and intact distal  pulses.   Pulmonary/Chest: Effort normal and breath sounds normal.  Musculoskeletal: She exhibits no edema.  Neurological: She is alert and oriented to person, place, and time.  Skin: Skin is warm and dry. No rash noted.  Psychiatric: She has a normal mood and affect.   Depression screen [facility] 2/9 03/03/2015 03/03/2015 11/04/2014 10/04/2014 08/23/2014  Decreased Interest 0 0 0 0 0  Down, Depressed, Hopeless 2 0 0 0 0  PHQ - 2 Score 2 0 0 0 0  Altered sleeping 3 - - - -  Tired, decreased energy 2 - - - -  Change in appetite 1 - - - -   Feeling bad or failure about yourself  2 - - - -  Trouble concentrating 0 - - - -  Moving slowly or fidgety/restless 1 - - - -  PHQ-9 Score 11 - - - -    GAD 7 : Generalized Anxiety Score 03/03/2015  Nervous, Anxious, on Edge 1  Control/stop worrying 3  Worry too much - different things 2  Trouble relaxing 1  Restless 2  Easily annoyed or irritable 2  Afraid - awful might happen 2  Total GAD 7 Score 13     Assessment & Plan:   Problem List Items Addressed This Visit    Abdominal pain   Relevant Medications   cyclobenzaprine (FLEXERIL) 10 MG tablet   Anemia, chronic disease - Primary (Chronic)   Relevant Orders   CBC   Depression   Relevant Medications   sertraline (ZOLOFT) 25 MG tablet   Esophageal varices determined by endoscopy (HCC)   Insomnia   Relevant Medications   zolpidem (AMBIEN) 10 MG tablet   Thrombocytopenia (HCC) (Chronic)   Relevant Orders   CBC      No orders of the defined types were placed in this encounter.    Follow-up: No Follow-up on file.   Dessa Phi MD

## 2015-03-04 LAB — CBC
HEMATOCRIT: 27.3 % — AB (ref 36.0–46.0)
HEMOGLOBIN: 9.5 g/dL — AB (ref 12.0–15.0)
MCH: 33.5 pg (ref 26.0–34.0)
MCHC: 34.8 g/dL (ref 30.0–36.0)
MCV: 96.1 fL (ref 78.0–100.0)
MPV: 9.3 fL (ref 8.6–12.4)
Platelets: 99 10*3/uL — ABNORMAL LOW (ref 150–400)
RBC: 2.84 MIL/uL — AB (ref 3.87–5.11)
RDW: 13.8 % (ref 11.5–15.5)
WBC: 6.7 10*3/uL (ref 4.0–10.5)

## 2015-03-04 NOTE — Telephone Encounter (Signed)
Pt. Called requesting Tylenol #3 because she is in pain and could not sleep last night. Please f/u with pt.

## 2015-03-07 MED ORDER — ACETAMINOPHEN-CODEINE #3 300-30 MG PO TABS: 1.0000 | ORAL_TABLET | Freq: Three times a day (TID) | ORAL | Status: DC | PRN

## 2015-03-07 NOTE — Telephone Encounter (Signed)
Patient returned phone call, please f/u with pt.    °

## 2015-03-07 NOTE — Telephone Encounter (Signed)
Tylenol #3 refilled  

## 2015-03-08 ENCOUNTER — Telehealth: Payer: Self-pay | Admitting: Family Medicine

## 2015-03-08 NOTE — Telephone Encounter (Signed)
Patient returning call to nurse.Marland Kitchen.Marland Kitchen.Marland Kitchen.Marland Kitchen.please follow up with patient

## 2015-03-08 NOTE — Telephone Encounter (Signed)
LVM Rx ready at front office

## 2015-03-09 NOTE — Telephone Encounter (Signed)
LVM to return call.

## 2015-03-09 NOTE — Telephone Encounter (Signed)
-----   Message from Dessa PhiJosalyn Funches, MD sent at 03/04/2015  9:38 AM EST ----- hgb improved to 9.5, anemia improved

## 2015-03-14 NOTE — Telephone Encounter (Signed)
LVM to return call.

## 2015-03-15 ENCOUNTER — Inpatient Hospital Stay: Payer: No Typology Code available for payment source | Admitting: Family Medicine

## 2015-03-21 ENCOUNTER — Encounter (HOSPITAL_COMMUNITY): Payer: Self-pay | Admitting: Emergency Medicine

## 2015-03-21 DIAGNOSIS — R278 Other lack of coordination: Secondary | ICD-10-CM | POA: Diagnosis present

## 2015-03-21 DIAGNOSIS — K7581 Nonalcoholic steatohepatitis (NASH): Secondary | ICD-10-CM | POA: Diagnosis present

## 2015-03-21 DIAGNOSIS — Z6829 Body mass index (BMI) 29.0-29.9, adult: Secondary | ICD-10-CM

## 2015-03-21 DIAGNOSIS — Z8711 Personal history of peptic ulcer disease: Secondary | ICD-10-CM

## 2015-03-21 DIAGNOSIS — E860 Dehydration: Secondary | ICD-10-CM | POA: Diagnosis present

## 2015-03-21 DIAGNOSIS — E669 Obesity, unspecified: Secondary | ICD-10-CM | POA: Diagnosis present

## 2015-03-21 DIAGNOSIS — N179 Acute kidney failure, unspecified: Secondary | ICD-10-CM | POA: Diagnosis present

## 2015-03-21 DIAGNOSIS — Z86010 Personal history of colonic polyps: Secondary | ICD-10-CM

## 2015-03-21 DIAGNOSIS — N184 Chronic kidney disease, stage 4 (severe): Secondary | ICD-10-CM | POA: Diagnosis present

## 2015-03-21 DIAGNOSIS — E876 Hypokalemia: Secondary | ICD-10-CM | POA: Diagnosis present

## 2015-03-21 DIAGNOSIS — K7290 Hepatic failure, unspecified without coma: Principal | ICD-10-CM | POA: Diagnosis present

## 2015-03-21 DIAGNOSIS — Z833 Family history of diabetes mellitus: Secondary | ICD-10-CM

## 2015-03-21 DIAGNOSIS — D61818 Other pancytopenia: Secondary | ICD-10-CM | POA: Diagnosis present

## 2015-03-21 DIAGNOSIS — G40909 Epilepsy, unspecified, not intractable, without status epilepticus: Secondary | ICD-10-CM | POA: Diagnosis present

## 2015-03-21 DIAGNOSIS — I8500 Esophageal varices without bleeding: Secondary | ICD-10-CM | POA: Diagnosis present

## 2015-03-21 DIAGNOSIS — Z87891 Personal history of nicotine dependence: Secondary | ICD-10-CM

## 2015-03-21 DIAGNOSIS — Z23 Encounter for immunization: Secondary | ICD-10-CM

## 2015-03-21 DIAGNOSIS — D696 Thrombocytopenia, unspecified: Secondary | ICD-10-CM | POA: Diagnosis present

## 2015-03-21 DIAGNOSIS — Z79899 Other long term (current) drug therapy: Secondary | ICD-10-CM

## 2015-03-21 DIAGNOSIS — Z809 Family history of malignant neoplasm, unspecified: Secondary | ICD-10-CM

## 2015-03-21 DIAGNOSIS — D638 Anemia in other chronic diseases classified elsewhere: Secondary | ICD-10-CM | POA: Diagnosis present

## 2015-03-21 DIAGNOSIS — Z8249 Family history of ischemic heart disease and other diseases of the circulatory system: Secondary | ICD-10-CM

## 2015-03-21 DIAGNOSIS — D689 Coagulation defect, unspecified: Secondary | ICD-10-CM | POA: Diagnosis present

## 2015-03-21 DIAGNOSIS — E872 Acidosis: Secondary | ICD-10-CM | POA: Diagnosis present

## 2015-03-21 DIAGNOSIS — I129 Hypertensive chronic kidney disease with stage 1 through stage 4 chronic kidney disease, or unspecified chronic kidney disease: Secondary | ICD-10-CM | POA: Diagnosis present

## 2015-03-21 DIAGNOSIS — K766 Portal hypertension: Secondary | ICD-10-CM | POA: Diagnosis present

## 2015-03-21 DIAGNOSIS — E869 Volume depletion, unspecified: Secondary | ICD-10-CM | POA: Diagnosis present

## 2015-03-21 DIAGNOSIS — K7030 Alcoholic cirrhosis of liver without ascites: Secondary | ICD-10-CM | POA: Diagnosis present

## 2015-03-21 DIAGNOSIS — F329 Major depressive disorder, single episode, unspecified: Secondary | ICD-10-CM | POA: Diagnosis present

## 2015-03-21 LAB — CBG MONITORING, ED: GLUCOSE-CAPILLARY: 108 mg/dL — AB (ref 65–99)

## 2015-03-21 NOTE — Telephone Encounter (Signed)
Pt. Mother called requesting to speak to nurse regarding the medication of lactulose (CHRONULAC) 10 GM/15ML solution. Pt. Mother stated that the amount of the medications only combines to 4 days worth of medication, she is going to come in today to pick up medication and would like to speak to the nurse. Please f/u with pt.

## 2015-03-21 NOTE — ED Notes (Signed)
Patient here with brother. Responds to commands and is able to report her name, but otherwise disoriented. Flat affect with sluggish response. Eyes appear jaundice. History of liver disease. Brother reports that patient was fine this AM when he left for work at 0730, but upon returning this evening she was confused. Patient states "I feel fine". Denies pain and is unsure of why she is here.

## 2015-03-22 ENCOUNTER — Inpatient Hospital Stay (HOSPITAL_COMMUNITY): Payer: Medicaid Other

## 2015-03-22 ENCOUNTER — Encounter (HOSPITAL_COMMUNITY): Payer: Self-pay | Admitting: Family Medicine

## 2015-03-22 ENCOUNTER — Inpatient Hospital Stay (HOSPITAL_COMMUNITY)
Admission: EM | Admit: 2015-03-22 | Discharge: 2015-03-30 | DRG: 442 | Disposition: A | Discharge status: Home / Self Care | Payer: Medicaid Other | Attending: Internal Medicine | Admitting: Internal Medicine

## 2015-03-22 ENCOUNTER — Ambulatory Visit (HOSPITAL_COMMUNITY): Payer: Self-pay

## 2015-03-22 DIAGNOSIS — K7460 Unspecified cirrhosis of liver: Secondary | ICD-10-CM

## 2015-03-22 DIAGNOSIS — E876 Hypokalemia: Secondary | ICD-10-CM | POA: Diagnosis present

## 2015-03-22 DIAGNOSIS — G40909 Epilepsy, unspecified, not intractable, without status epilepticus: Secondary | ICD-10-CM | POA: Diagnosis present

## 2015-03-22 DIAGNOSIS — Z6829 Body mass index (BMI) 29.0-29.9, adult: Secondary | ICD-10-CM | POA: Diagnosis not present

## 2015-03-22 DIAGNOSIS — Z833 Family history of diabetes mellitus: Secondary | ICD-10-CM | POA: Diagnosis not present

## 2015-03-22 DIAGNOSIS — I8500 Esophageal varices without bleeding: Secondary | ICD-10-CM | POA: Diagnosis present

## 2015-03-22 DIAGNOSIS — K7290 Hepatic failure, unspecified without coma: Secondary | ICD-10-CM | POA: Diagnosis not present

## 2015-03-22 DIAGNOSIS — N179 Acute kidney failure, unspecified: Secondary | ICD-10-CM | POA: Diagnosis present

## 2015-03-22 DIAGNOSIS — E669 Obesity, unspecified: Secondary | ICD-10-CM | POA: Diagnosis present

## 2015-03-22 DIAGNOSIS — E869 Volume depletion, unspecified: Secondary | ICD-10-CM | POA: Diagnosis present

## 2015-03-22 DIAGNOSIS — Z87891 Personal history of nicotine dependence: Secondary | ICD-10-CM | POA: Diagnosis not present

## 2015-03-22 DIAGNOSIS — D638 Anemia in other chronic diseases classified elsewhere: Secondary | ICD-10-CM | POA: Diagnosis present

## 2015-03-22 DIAGNOSIS — K766 Portal hypertension: Secondary | ICD-10-CM | POA: Diagnosis present

## 2015-03-22 DIAGNOSIS — D689 Coagulation defect, unspecified: Secondary | ICD-10-CM | POA: Diagnosis present

## 2015-03-22 DIAGNOSIS — N289 Disorder of kidney and ureter, unspecified: Secondary | ICD-10-CM

## 2015-03-22 DIAGNOSIS — R4182 Altered mental status, unspecified: Secondary | ICD-10-CM | POA: Diagnosis not present

## 2015-03-22 DIAGNOSIS — E860 Dehydration: Secondary | ICD-10-CM | POA: Diagnosis present

## 2015-03-22 DIAGNOSIS — R188 Other ascites: Secondary | ICD-10-CM

## 2015-03-22 DIAGNOSIS — Z86010 Personal history of colonic polyps: Secondary | ICD-10-CM | POA: Diagnosis not present

## 2015-03-22 DIAGNOSIS — E872 Acidosis: Secondary | ICD-10-CM | POA: Diagnosis present

## 2015-03-22 DIAGNOSIS — D696 Thrombocytopenia, unspecified: Secondary | ICD-10-CM | POA: Diagnosis present

## 2015-03-22 DIAGNOSIS — Z79899 Other long term (current) drug therapy: Secondary | ICD-10-CM | POA: Diagnosis not present

## 2015-03-22 DIAGNOSIS — Z8711 Personal history of peptic ulcer disease: Secondary | ICD-10-CM | POA: Diagnosis not present

## 2015-03-22 DIAGNOSIS — K7581 Nonalcoholic steatohepatitis (NASH): Secondary | ICD-10-CM | POA: Diagnosis present

## 2015-03-22 DIAGNOSIS — N184 Chronic kidney disease, stage 4 (severe): Secondary | ICD-10-CM | POA: Diagnosis present

## 2015-03-22 DIAGNOSIS — G9340 Encephalopathy, unspecified: Secondary | ICD-10-CM | POA: Insufficient documentation

## 2015-03-22 DIAGNOSIS — K7030 Alcoholic cirrhosis of liver without ascites: Secondary | ICD-10-CM | POA: Diagnosis present

## 2015-03-22 DIAGNOSIS — D61818 Other pancytopenia: Secondary | ICD-10-CM | POA: Diagnosis present

## 2015-03-22 DIAGNOSIS — I129 Hypertensive chronic kidney disease with stage 1 through stage 4 chronic kidney disease, or unspecified chronic kidney disease: Secondary | ICD-10-CM | POA: Diagnosis present

## 2015-03-22 DIAGNOSIS — K7682 Hepatic encephalopathy: Secondary | ICD-10-CM | POA: Diagnosis present

## 2015-03-22 DIAGNOSIS — R278 Other lack of coordination: Secondary | ICD-10-CM | POA: Diagnosis present

## 2015-03-22 DIAGNOSIS — Z8249 Family history of ischemic heart disease and other diseases of the circulatory system: Secondary | ICD-10-CM | POA: Diagnosis not present

## 2015-03-22 DIAGNOSIS — R401 Stupor: Secondary | ICD-10-CM

## 2015-03-22 DIAGNOSIS — Z809 Family history of malignant neoplasm, unspecified: Secondary | ICD-10-CM | POA: Diagnosis not present

## 2015-03-22 DIAGNOSIS — R569 Unspecified convulsions: Secondary | ICD-10-CM

## 2015-03-22 DIAGNOSIS — F329 Major depressive disorder, single episode, unspecified: Secondary | ICD-10-CM | POA: Diagnosis present

## 2015-03-22 DIAGNOSIS — R7989 Other specified abnormal findings of blood chemistry: Secondary | ICD-10-CM

## 2015-03-22 DIAGNOSIS — Z23 Encounter for immunization: Secondary | ICD-10-CM | POA: Diagnosis not present

## 2015-03-22 LAB — LACTIC ACID, PLASMA
LACTIC ACID, VENOUS: 2.1 mmol/L — AB (ref 0.5–2.0)
LACTIC ACID, VENOUS: 2.9 mmol/L — AB (ref 0.5–2.0)
Lactic Acid, Venous: 2.7 mmol/L (ref 0.5–2.0)

## 2015-03-22 LAB — BASIC METABOLIC PANEL
ANION GAP: 17 — AB (ref 5–15)
ANION GAP: 16 — AB (ref 5–15)
BUN: 64 mg/dL — AB (ref 6–20)
BUN: 62 mg/dL — AB (ref 6–20)
CALCIUM: 10.5 mg/dL — AB (ref 8.9–10.3)
CHLORIDE: 106 mmol/L (ref 101–111)
CO2: 18 mmol/L — ABNORMAL LOW (ref 22–32)
CO2: 20 mmol/L — ABNORMAL LOW (ref 22–32)
Calcium: 10.5 mg/dL — ABNORMAL HIGH (ref 8.9–10.3)
Chloride: 103 mmol/L (ref 101–111)
Creatinine, Ser: 6.14 mg/dL — ABNORMAL HIGH (ref 0.44–1.00)
Creatinine, Ser: 6.17 mg/dL — ABNORMAL HIGH (ref 0.44–1.00)
GFR calc Af Amer: 9 mL/min — ABNORMAL LOW (ref >60)
GFR calc Af Amer: 9 mL/min — ABNORMAL LOW (ref >60)
GFR calc non Af Amer: 8 mL/min — ABNORMAL LOW (ref >60)
GFR, EST NON AFRICAN AMERICAN: 7 mL/min — AB (ref >60)
GLUCOSE: 106 mg/dL — AB (ref 65–99)
GLUCOSE: 108 mg/dL — AB (ref 65–99)
POTASSIUM: 3.7 mmol/L (ref 3.5–5.1)
POTASSIUM: 3.5 mmol/L (ref 3.5–5.1)
SODIUM: 139 mmol/L (ref 135–145)
Sodium: 141 mmol/L (ref 135–145)

## 2015-03-22 LAB — MAGNESIUM: Magnesium: 2.1 mg/dL (ref 1.7–2.4)

## 2015-03-22 LAB — URINALYSIS, ROUTINE W REFLEX MICROSCOPIC
BILIRUBIN URINE: NEGATIVE
BILIRUBIN URINE: NEGATIVE
GLUCOSE, UA: NEGATIVE mg/dL
Glucose, UA: NEGATIVE mg/dL
KETONES UR: NEGATIVE mg/dL
Ketones, ur: NEGATIVE mg/dL
LEUKOCYTES UA: NEGATIVE
Leukocytes, UA: NEGATIVE
NITRITE: NEGATIVE
Nitrite: NEGATIVE
PH: 5.5 (ref 5.0–8.0)
PROTEIN: NEGATIVE mg/dL
Protein, ur: NEGATIVE mg/dL
SPECIFIC GRAVITY, URINE: 1.012 (ref 1.005–1.030)
Specific Gravity, Urine: 1.01 (ref 1.005–1.030)
pH: 5.5 (ref 5.0–8.0)

## 2015-03-22 LAB — URINE MICROSCOPIC-ADD ON

## 2015-03-22 LAB — PROTEIN / CREATININE RATIO, URINE
Creatinine, Urine: 115.85 mg/dL
Protein Creatinine Ratio: 0.16 mg/mg{Cre} — ABNORMAL HIGH (ref 0.00–0.15)
Total Protein, Urine: 18 mg/dL

## 2015-03-22 LAB — COMPREHENSIVE METABOLIC PANEL
ALK PHOS: 54 U/L (ref 38–126)
ALT: 23 U/L (ref 14–54)
ALT: 24 U/L (ref 14–54)
ANION GAP: 17 — AB (ref 5–15)
AST: 39 U/L (ref 15–41)
AST: 43 U/L — ABNORMAL HIGH (ref 15–41)
Albumin: 3.2 g/dL — ABNORMAL LOW (ref 3.5–5.0)
Albumin: 3.6 g/dL (ref 3.5–5.0)
Alkaline Phosphatase: 46 U/L (ref 38–126)
Anion gap: 16 — ABNORMAL HIGH (ref 5–15)
BUN: 60 mg/dL — AB (ref 6–20)
BUN: 67 mg/dL — ABNORMAL HIGH (ref 6–20)
CALCIUM: 11.1 mg/dL — AB (ref 8.9–10.3)
CHLORIDE: 101 mmol/L (ref 101–111)
CO2: 22 mmol/L (ref 22–32)
CO2: 23 mmol/L (ref 22–32)
CREATININE: 6.41 mg/dL — AB (ref 0.44–1.00)
CREATININE: 7.09 mg/dL — AB (ref 0.44–1.00)
Calcium: 10.4 mg/dL — ABNORMAL HIGH (ref 8.9–10.3)
Chloride: 96 mmol/L — ABNORMAL LOW (ref 101–111)
GFR calc Af Amer: 8 mL/min — ABNORMAL LOW (ref >60)
GFR, EST AFRICAN AMERICAN: 7 mL/min — AB (ref >60)
GFR, EST NON AFRICAN AMERICAN: 6 mL/min — AB (ref >60)
GFR, EST NON AFRICAN AMERICAN: 7 mL/min — AB (ref >60)
Glucose, Bld: 115 mg/dL — ABNORMAL HIGH (ref 65–99)
Glucose, Bld: 117 mg/dL — ABNORMAL HIGH (ref 65–99)
Potassium: 3.2 mmol/L — ABNORMAL LOW (ref 3.5–5.1)
Potassium: 3.2 mmol/L — ABNORMAL LOW (ref 3.5–5.1)
Sodium: 136 mmol/L (ref 135–145)
Sodium: 139 mmol/L (ref 135–145)
Total Bilirubin: 4.7 mg/dL — ABNORMAL HIGH (ref 0.3–1.2)
Total Bilirubin: 5.3 mg/dL — ABNORMAL HIGH (ref 0.3–1.2)
Total Protein: 8.3 g/dL — ABNORMAL HIGH (ref 6.5–8.1)
Total Protein: 9.4 g/dL — ABNORMAL HIGH (ref 6.5–8.1)

## 2015-03-22 LAB — GLUCOSE, CAPILLARY: GLUCOSE-CAPILLARY: 109 mg/dL — AB (ref 65–99)

## 2015-03-22 LAB — SODIUM, URINE, RANDOM: SODIUM UR: 55 mmol/L

## 2015-03-22 LAB — CBC
HCT: 27 % — ABNORMAL LOW (ref 36.0–46.0)
HCT: 31 % — ABNORMAL LOW (ref 36.0–46.0)
HEMOGLOBIN: 10.6 g/dL — AB (ref 12.0–15.0)
Hemoglobin: 9.5 g/dL — ABNORMAL LOW (ref 12.0–15.0)
MCH: 32.6 pg (ref 26.0–34.0)
MCH: 33.1 pg (ref 26.0–34.0)
MCHC: 34.2 g/dL (ref 30.0–36.0)
MCHC: 35.2 g/dL (ref 30.0–36.0)
MCV: 94.1 fL (ref 78.0–100.0)
MCV: 95.4 fL (ref 78.0–100.0)
PLATELETS: 76 10*3/uL — AB (ref 150–400)
PLATELETS: 94 10*3/uL — AB (ref 150–400)
RBC: 2.87 MIL/uL — ABNORMAL LOW (ref 3.87–5.11)
RBC: 3.25 MIL/uL — AB (ref 3.87–5.11)
RDW: 14.8 % (ref 11.5–15.5)
RDW: 15 % (ref 11.5–15.5)
WBC: 5.9 10*3/uL (ref 4.0–10.5)
WBC: 7 10*3/uL (ref 4.0–10.5)

## 2015-03-22 LAB — PROTIME-INR
INR: 1.51 — AB (ref 0.00–1.49)
PROTHROMBIN TIME: 18.2 s — AB (ref 11.6–15.2)

## 2015-03-22 LAB — CREATININE, URINE, RANDOM: Creatinine, Urine: 116.11 mg/dL

## 2015-03-22 LAB — AMMONIA: AMMONIA: 214 umol/L — AB (ref 9–35)

## 2015-03-22 LAB — MRSA PCR SCREENING: MRSA BY PCR: NEGATIVE

## 2015-03-22 MED ORDER — LORAZEPAM 2 MG/ML IJ SOLN
1.0000 mg | Freq: Once | INTRAMUSCULAR | Status: AC
  Administered 2015-03-22: 1 mg via INTRAVENOUS
  Filled 2015-03-22: qty 1

## 2015-03-22 MED ORDER — SODIUM CHLORIDE 0.9 % IV BOLUS (SEPSIS)
1000.0000 mL | Freq: Once | INTRAVENOUS | Status: AC
  Administered 2015-03-22: 1000 mL via INTRAVENOUS

## 2015-03-22 MED ORDER — PANTOPRAZOLE SODIUM 40 MG PO TBEC
40.0000 mg | DELAYED_RELEASE_TABLET | Freq: Two times a day (BID) | ORAL | Status: DC
  Filled 2015-03-22: qty 1

## 2015-03-22 MED ORDER — POTASSIUM CHLORIDE 10 MEQ/100ML IV SOLN
10.0000 meq | INTRAVENOUS | Status: AC
  Administered 2015-03-22 (×3): 10 meq via INTRAVENOUS
  Filled 2015-03-22 (×3): qty 100

## 2015-03-22 MED ORDER — POTASSIUM CHLORIDE CRYS ER 20 MEQ PO TBCR
20.0000 meq | EXTENDED_RELEASE_TABLET | Freq: Once | ORAL | Status: DC
  Filled 2015-03-22: qty 1

## 2015-03-22 MED ORDER — CEFTRIAXONE SODIUM 2 G IJ SOLR
2.0000 g | INTRAMUSCULAR | Status: DC
  Administered 2015-03-22 – 2015-03-25 (×4): 2 g via INTRAVENOUS
  Filled 2015-03-22 (×5): qty 2

## 2015-03-22 MED ORDER — SODIUM CHLORIDE 0.9 % IV SOLN
INTRAVENOUS | Status: DC
  Administered 2015-03-22: 07:00:00 via INTRAVENOUS

## 2015-03-22 MED ORDER — LACTULOSE 10 GM/15ML PO SOLN
20.0000 g | Freq: Two times a day (BID) | ORAL | Status: DC
  Filled 2015-03-22: qty 30

## 2015-03-22 MED ORDER — LACTULOSE 10 GM/15ML PO SOLN
30.0000 g | Freq: Once | ORAL | Status: AC
  Administered 2015-03-22: 30 g via ORAL
  Filled 2015-03-22: qty 45

## 2015-03-22 MED ORDER — LACTULOSE ENEMA
300.0000 mL | Freq: Once | ORAL | Status: AC
  Administered 2015-03-22: 300 mL via RECTAL
  Filled 2015-03-22: qty 300

## 2015-03-22 MED ORDER — SODIUM CHLORIDE 0.9 % IV SOLN
INTRAVENOUS | Status: DC
  Administered 2015-03-22: 11:00:00 via INTRAVENOUS

## 2015-03-22 MED ORDER — PANTOPRAZOLE SODIUM 40 MG IV SOLR
40.0000 mg | Freq: Two times a day (BID) | INTRAVENOUS | Status: DC
  Administered 2015-03-22 – 2015-03-25 (×7): 40 mg via INTRAVENOUS
  Filled 2015-03-22 (×6): qty 40

## 2015-03-22 MED ORDER — SODIUM BICARBONATE 8.4 % IV SOLN
INTRAVENOUS | Status: DC
  Administered 2015-03-22: 19:00:00 via INTRAVENOUS
  Filled 2015-03-22 (×4): qty 150

## 2015-03-22 NOTE — H&P (Signed)
History and Physical  Patient Name: Julie Rowland     NWG:[id number]    DOB: 26-Mar-1971    DOA: 03/22/2015 Referring physician: Dereck Leep, MD PCP: Lora Paula, MD      Chief Complaint: Altered mental status  HPI: Julie Rowland is a 44 y.o. female with a past medical history significant for NASH/EtOH cirrhosis with varices, hepatic encephalopathy on lactulose and worsening renal function who presents with acute confusion.  History is collected from the patient's mother and father as the patient is unable to provide her own history. They report that she has been at her baseline for the last month since being hospitalized for SBP in the beginning of November. However, over the last 2 days she has gotten increasingly slow and somnolent. Tonight the patient was able to have supper with her family and then go to bed, but around 11 PM family found her wandering around the house not responding to questions and newly jaundiced, and so they brought her to the ER.    Family notes worse jaundice, disorientation, increased somnolence over 1-2 days, 3 bowel movements per day. Family are aware of no fever, abdominal pain, decreased urine output, missed medications or changes in medication or taking extra medication.  In the ED, the patient was afebrile, without ascites, and had no focal complaints but was altered.  She had new AKI with SCr 7 mg/dL from 2.6 mg/dL and an elevated anion gap.  Her ammonia was elevated to 214 umol/L, and her bilirubin was elevated.     Review of Systems:  Unable to obtain due to patient mentation.     Allergies: No Known Allergies  Home medications: 1. Furosemide 40 mg daily 2. Spironolactone 100 mg daily 3. Lactulose 20 mg twice daily 4. Pantoprazole 40 mg twice daily 5. Sertraline 25 mg daily 6. Zolpidem 10 mg as needed for sleep  Past medical history: 1. Cirrhosis       -Presumed alcohol (previous history of alcoholism) and NASH  -Endoscopy noted varices, status post banding       -Started lactulose during hospitalization 1 month ago 2. Depression   Past surgical history: EGDs and colonoscopy only  Family history:  Mother, diabetes, hypertension, cancer. No family history of liver disease.   Social History:  Patient lives with her parents.  She does not drive.  She is a former smoker. She formerly used alcohol. At home, at her baseline, the patient is independent with ADLs.  She has three children who live with her husband in [place].      Physical Exam: BP 150/67 mmHg  Pulse 91  Temp(Src) 97.7 F (36.5 C) (Oral)  Resp 14  SpO2 95% General appearance: Well-developed, adult female, somnolent, dazed.   Eyes: Marked scleral icterus. No discharge.     ENT: No nasal deformity, discharge, or epistaxis.  OP moist without lesions.   Skin: Warm and dry.  Mild jaundice.  No suspicious rashes or lesions. Cardiac: Tachycardic, regular, nl S1-S2, 1/6 SEM at LUSB.  Capillary refill is brisk.  No LE edema.  Radial pulses 2+ and symmetric. Respiratory: Normal respiratory rate and rhythm.  CTAB without rales or wheezes. Abdomen: Abdomen soft without rigidity.  No ascites, distension.  No TTP in RUQ. MSK: No deformities or effusions. Neuro: Confused and dazed.  Able to respond yes, but not consistently.  Follows commands slowly.  Pupils mydriatric, some reaction to light, symmetric.   Psych: Unable to assess.  Labs on Admission:  The metabolic panel shows hypokalemia, serum creatinine 7 g/dL from a previous of 2.6 mg/dL, with a previous of 0.7 mg/dL as recently as August of this year. Hypercalcemia. Elevated anion gap. AST is 43 U/L, total bilirubin is elevated to 5.3 mg/dL from a previous of 2.9 mg/dL. The ammonia level is 214 umol/L, from a previous of 80 during her last hospitalization.  The INR is 1.5 The complete blood count shows normocytic anemia, improved from previous. Stable  thrombocytopenia.   EKG: Independently reviewed. Sinus tachycardia    Assessment/Plan 1. Encephalopathy:  This is a global alteration in consciousness.  No evidence of infection, SBP doubted.  No reported or suspected ingestions.  Suspected hyperammonemia and AKI.   -Continue lactulose   2. Cirrhosis:  The total bilirubin, ammonia, and INR are elevated.  MELD 31 -Hold diuretics -Consult to gastroenterology, appreciate recommendations  3. AKI:  AKI insetting of worsening liver failure. No clinical signs of ascites or fluid shifts. The differential includes hypovolemia from diuretics, hepatorenal syndrome, intrinsic renal disease. -Urinalysis -Renal US -Hold diuretics -Urine electrolytes (urea given furosemide use) -Consult to nephrology, appreciate recommendations -Will treat with fluid resuscitation initially, and defer albumin/splanchnic vasoconstrictors to GI and nephrology if fluid resuscitation is unsuccessful  4. Hypokalemia:  -Cautious supplementation in setting of renal failure  5. Hypercalcemia:  -Fluid resuscitation and trend calcium  6. Elevated anion gap:  Suspect lactic acidosis and uremia in setting of liver failure and renal failure. -Check lactic acid  7. Anemia and thrombocytopenia:  Stable.   8. Depression:  -Hold sertraline altered     DVT PPx: SCDs Diet: Renal Consultants: Nephrology and Gastroenterology Code Status: Full Family Communication: Father at the bedside and mother by phone.  Medical decision making: What exists of the patient's previous chart was reviewed in depth and the case was discussed with with Dr. Mora Bellmanni. Patient seen 2:12 AM on 03/22/2015.  Disposition Plan:  Admit for fluid resuscitation, renal US and studies.  Consultation with specialty services regarding renal failure and cirrhotic encephalopathy.  Expect 5+ days hospitalization.      Alberteen SamChristopher P Danford [facility] Pager [phone]

## 2015-03-22 NOTE — Progress Notes (Signed)
Julie MerlMichelle L Gladish [id number] Admitted to [id number] 03/22/2015 6:54 AM Attending Provider: No att. providers found    Julie Rowland is a 44 y.o. female patient admitted from ED awake, alert  & disoriented,  Full Code, VSS - Blood pressure 138/67, pulse 89, temperature 97.5 F (36.4 C), temperature source Oral, resp. rate 18, SpO2 100 %., O2  RA, no c/o shortness of breath, no c/o chest pain, no distress noted. Tele # 22 placed and pt is currently running:SR   IV site WDL:  with a transparent dsg that's clean dry and intact.  Allergies:  No Known Allergies   Past Medical History  Diagnosis Date  . Migraine headache     At age 44  . Hypertension Dx 2003  . Anxiety Dx 2002  . Seizures (HCC) 2014 and 2009  . Portal hypertension (HCC)   . Ascites 10/2014.    Small volume, not enough to tap  . Hepatic encephalopathy (HCC) 02/2015.    History:   Pt unable to provide history at this time.  Admission INP armband ID verified, and in place. SR up x 2, fall risk High; bed alarm placed.  Requesting camera room when it is available.  Skin, clean-dry- intact without evidence of bruising, or skin tears, jaundiced.    Will cont to monitor and assist as needed.  Elisha PonderFaucette, Thomasenia Dowse Nicole, RN 03/22/2015 6:54 AM

## 2015-03-22 NOTE — ED Provider Notes (Addendum)
CSN: [id number]     Arrival date & time 03/21/15  2331 History  By signing my name below, I, Julie Rowland, attest that this documentation has been prepared under the direction and in the presence of Tomasita CrumbleAdeleke Ronney Honeywell, MD . Electronically Signed: Freida Busmaniana Rowland, Scribe. 03/22/2015. 1:25 AM.    Chief Complaint  Patient presents with  . Altered Mental Status   LEVEL 5 CAVEAT DUE TO Altered Mental Status   The history is provided by a relative. No language interpreter was used.     HPI Comments:  Julie Rowland is a 44 y.o. female who presents to the Emergency Department for AMS. Per pt's father, pt became non communicative and disoriented ~ 2330 last night. Father reports extensive medical history including: hepatic encephalopathy, portal HTN, esophogeal varices, 2 banding procedures, cirrhosis, anemia, pancytopenia, and a blood transfusion with last hospitalization on 02/19/15 .  At this time pt is not answering questions. Unable to fully obtain HPI/ROS secondary to mental status.   Past Medical History  Diagnosis Date  . Migraine headache     At age 44  . Hypertension Dx 2003  . Anxiety Dx 2002  . Seizures (HCC) 2014 and 2009  . Portal hypertension (HCC)   . Ascites 10/2014.    Small volume, not enough to tap  . Hepatic encephalopathy (HCC) 02/2015.   Past Surgical History  Procedure Laterality Date  . Colonoscopy  2015    Hyperplastic polyp. Random biopsies negative for collagenous/microscopic colitis. [facility].  . Vein ligation and stripping N/A 07/14/2014    Procedure: EXCISION OF VARICOSE VEINS AT UMBILICUS;  Surgeon: Larina Earthlyodd F Early, MD;  [facility];  Service: Vascular;  Laterality: N/A;  . Umbilical hernia repair N/A 07/14/2014    Procedure: HERNIA REPAIR UMBILICAL ADULT;  Surgeon: Larina Earthlyodd F Early, MD;  [facility];  Service: Vascular;  Laterality: N/A;  . Esophagogastroduodenoscopy (egd) with propofol N/A 12/07/2014    GMW:NUUVOZDGSLF:moderate portal hypertensive  gastropathy/grade 3 varices  . Esophageal banding N/A 12/07/2014    Procedure: ESOPHAGEAL BANDING (4 bands);  Surgeon: West BaliSandi L Fields, MD;  Location: [facility] ORS;  Service: Endoscopy;  Laterality: N/A;  . Esophagogastroduodenoscopy (egd) with propofol N/A 02/08/2015    Procedure: ESOPHAGOGASTRODUODENOSCOPY (EGD) WITH PROPOFOL;  Surgeon: West BaliSandi L Fields, MD;  Location: [facility] ORS;  Service: Endoscopy;  Laterality: N/A;  1030  . Gastric varices banding N/A 02/08/2015    Procedure: GASTRIC VARICES BANDING;  Surgeon: West BaliSandi L Fields, MD;  Location: [facility] ORS;  Service: Endoscopy;  Laterality: N/A;  3 bands  . Esophagogastroduodenoscopy (egd) with propofol N/A 02/23/2015    Procedure: ESOPHAGOGASTRODUODENOSCOPY (EGD) WITH PROPOFOL;  Surgeon: Ruffin FrederickSteven Paul Armbruster, MD;  [facility];  Service: Gastroenterology;  Laterality: N/A;   Family History  Problem Relation Age of Onset  . Hypertension Mother   . Cancer Mother   . Diabetes Mother   . Colon cancer Neg Hx   . Liver disease     Social History  Substance Use Topics  . Smoking status: Former Smoker -- 20 years    Types: Cigarettes    Quit date: 08/02/2014  . Smokeless tobacco: Never Used  . Alcohol Use: 0.0 oz/week    0 Standard drinks or equivalent per week     Comment: Has not had ETOH in 09/08/14   OB History    No data available     Review of Systems  Unable to perform ROS: Mental status change  Allergies  Review of patient's allergies indicates no known allergies.  Home Medications   Prior to Admission medications   Medication Sig Start Date End Date Taking? Authorizing Provider  acetaminophen-codeine (TYLENOL #3) 300-30 MG tablet Take 1 tablet by mouth every 8 (eight) hours as needed for moderate pain. 03/07/15   [name], MD  ALPRAZolam (XANAX) 1 MG tablet Take 1 mg by mouth as needed for anxiety.    Historical Provider, MD  cyclobenzaprine (FLEXERIL) 10 MG tablet Take 1 tablet (10 mg total) by mouth 2 (two)  times daily as needed for muscle spasms (caution will cause drowsiness). 03/03/15   [name], MD  furosemide (LASIX) 40 MG tablet WITH ALDACTONE 100 MG DAILY 02/21/15   Calvert Cantor, MD  lactulose (CHRONULAC) 10 GM/15ML solution Take 30 mLs (20 g total) by mouth 2 (two) times daily. 02/28/15   [name], MD  pantoprazole (PROTONIX) 40 MG tablet Take 1 tablet (40 mg total) by mouth 2 (two) times daily. 02/23/15   Penny Pia, MD  sertraline (ZOLOFT) 25 MG tablet Take 1 tablet (25 mg total) by mouth daily. 03/03/15   Dessa Phi, MD  spironolactone (ALDACTONE) 100 MG tablet Take 1 tablet (100 mg total) by mouth daily. 02/21/15   Calvert Cantor, MD  sucralfate (CARAFATE) 1 GM/10ML suspension Take 10 mLs (1 g total) by mouth every 6 (six) hours. 02/23/15   Penny Pia, MD  zolpidem (AMBIEN) 10 MG tablet Take 1 tablet (10 mg total) by mouth at bedtime as needed for sleep. 03/03/15   [name], MD   BP 137/62 mmHg  Pulse 91  Temp(Src) 97.7 F (36.5 C) (Oral)  Resp 17  SpO2 97% Physical Exam  Constitutional: She appears well-developed and well-nourished. She appears distressed.  HENT:  Head: Normocephalic and atraumatic.  Nose: Nose normal.  Mouth/Throat: Oropharynx is clear and moist. No oropharyngeal exudate.  Eyes: Conjunctivae and EOM are normal. Pupils are equal, round, and reactive to light. Scleral icterus is present.  Neck: Normal range of motion. Neck supple. No JVD present. No tracheal deviation present. No thyromegaly present.  Cardiovascular: Normal rate, regular rhythm and normal heart sounds.  Exam reveals no gallop and no friction rub.   No murmur heard. Pulmonary/Chest: Effort normal and breath sounds normal. No respiratory distress. She has no wheezes. She exhibits no tenderness.  Abdominal: Soft. Bowel sounds are normal. She exhibits distension. She exhibits no mass. There is no tenderness. There is no rebound and no guarding.  Musculoskeletal: Normal range of  motion. She exhibits no edema or tenderness.  Lymphadenopathy:    She has no cervical adenopathy.  Neurological: She is alert. No cranial nerve deficit. She exhibits normal muscle tone.  Drowsy but arousable to verbal stimuli  Skin: Skin is warm and dry. No rash noted. No erythema. No pallor.  jaundiced  Nursing note and vitals reviewed.   ED Course  Procedures   DIAGNOSTIC STUDIES:  Oxygen Saturation is 97% on RA, normal by my interpretation.      Labs Review Labs Reviewed  COMPREHENSIVE METABOLIC PANEL - Abnormal; Notable for the following:    Potassium 3.2 (*)    Chloride 96 (*)    Glucose, Bld 117 (*)    BUN 67 (*)    Creatinine, Ser 7.09 (*)    Calcium 11.1 (*)    Total Protein 9.4 (*)    AST 43 (*)    Total Bilirubin 5.3 (*)    GFR calc non Af Amer 6 (*)  GFR calc Af Amer 7 (*)    Anion gap 17 (*)    All other components within normal limits  CBC - Abnormal; Notable for the following:    RBC 3.25 (*)    Hemoglobin 10.6 (*)    HCT 31.0 (*)    Platelets 94 (*)    All other components within normal limits  AMMONIA - Abnormal; Notable for the following:    Ammonia 214 (*)    All other components within normal limits  PROTIME-INR - Abnormal; Notable for the following:    Prothrombin Time 18.2 (*)    INR 1.51 (*)    All other components within normal limits  CBG MONITORING, ED - Abnormal; Notable for the following:    Glucose-Capillary 108 (*)    All other components within normal limits  CBG MONITORING, ED    Imaging Review No results found. I have personally reviewed and evaluated these images and lab results as part of my medical decision-making.   EKG Interpretation   Date/Time:  Tuesday March 22 2015 01:14:00 EST Ventricular Rate:  91 PR Interval:  177 QRS Duration: 88 QT Interval:  393 QTC Calculation: 483 R Axis:   55 Text Interpretation:  Sinus rhythm Baseline wander in lead(s) I II aVR No  significant change since last tracing  Confirmed by Erroll Luna  [phone]) on 03/22/2015 1:25:25 AM      MDM   Final diagnoses:  None   Patient presents emergency department for altered mental status. This occurred with sudden onset according to father. Ammonia levels over 200, creatinine is now 7, baseline is 2. Patient has developed hepatorenal syndrome due to her liver cirrhosis. This usually portends a poor outcome. Patient will be admitted for further care. I spoke with Dr. Maryfrances Bunnell with [facility] who accepts the patient to telemetry.   I personally performed the services described in this documentation, which was scribed in my presence. The recorded information has been reviewed and is accurate.     Tomasita Crumble, MD 03/22/15 6045  Tomasita Crumble, MD 04/08/15 4098

## 2015-03-22 NOTE — Progress Notes (Signed)
PROGRESS NOTE    Julie Rowland DOB: 10/05/1970 DOA: 03/22/2015 PCP: Lora PaulaFUNCHES, JOSALYN C, MD  HPI/Brief narrative 44 year old female patient with history of Nash/alcoholic cirrhosis complicated by portal hypertension, esophageal varices, hepatic encephalopathy & coagulopathy, HTN, pancytopenia, seizures, depression, recent hospitalization 02/19/15-02/23/15 for abdominal pain, suspected SBP, underwent EGD which showed esophageal ulcerations & hepatic encephalopathy, was discharged on lactulose, PPI, sucralfate, Lasix and Aldactone, said to have improved and at baseline since discharge until 2 days ago when she got progressively somnolent, confused and jaundiced and [facility]. As per family, having 3 BMs per day and no reported fever, abdominal pain decreased output, missed medications or change in medications or taking extra medications. In the ED, creatinine 7 (discharged recently at 2.6), elevated lactate and ammonia 214. Admitted initially to medical floor. On reevaluation later on 12/6, patient extremely drowsy and at risk for decline. Hence transferred to stepdown unit for close monitoring and management. [facility] and [facility].  Assessment/Plan:  Dehydration  - Secondary to poor oral intake,? GI losses from lactulose and diuretics.  - Hold diuretics.  - Hydrate aggressively and follow BMP.   Acute on chronic kidney disease - Last normal creatinine was on 12/14/14:1.06. Since 01/20/15, creatinine steadily increased from 1.6-2.6 at discharge on 02/25/15 - Now presented with creatinine of 7.09 - DD: Prerenal from dehydration, hepatorenal syndrome - Renal ultrasound: No hydronephrosis - Check urine sodium and creatinine - Strict intake output charting and daily weights: Discussed with nursing-states that in and out catheterization was done in ED but no output documented. Requested bladder scan now. - Continue IV normal saline at 125 ML per  hour. - Nephrology consulted. - Seems to be slightly better than last night: Creatinine improved from 7.09 > 6.41  Hypokalemia - Replace IV and follow. Check magnesium.  Acute hepatic encephalopathy - encephalopathy may be a combination of hepatic encephalopathy +/- uremia - Patient somnolent and unsafe to take by mouth meds or diet. Will make nothing by mouth for now. - Change lactulose to rectally. - No focal deficits. No clinical suspicion for infection. - Follow clinically and daily ammonia levels.  Nash/alcoholic cirrhosis complicated by portal hypertension, esophageal varices, hepatic encephalopathy, coagulopathy and? Hepatorenal syndrome - Recent EGD had shown esophageal ulcers. Will change to IV Protonix - Nothing by mouth, IV fluids and supportive treatment - [facility] consulted for assistance.  Lactic acidosis - Most likely secondary to poor tissue perfusion from dehydration and poor clearance related to liver disease - Continue IV hydration and follow lactate.  Hypercalcemia - Most likely secondary to dehydration. Continue IV hydration. Improving.  Depression  - Hold medications.  Anemia and thrombocytopenia - No reported bleeding. Follow CBC daily.  Documented seizure history - Monitor for seizures and seizure precautions   DVT prophylaxis: SCDs Code Status: Full Family Communication: None at bedside  Disposition Plan: Transfer patient to stepdown unit 12/6 for close monitoring and management.   Consultants:  Nephrology    [facility]  Procedures:  None   Antibiotics:  None   Subjective: Patient is extremely drowsy and unable to provide any history. As per nursing, oriented to self only but will not take medications.   Objective: Filed Vitals:   03/22/15 0345 03/22/15 0445 03/22/15 0545 03/22/15 0645  BP: 129/55 152/97 145/96 138/67  Pulse: 86 87  89  Temp:    97.5 F (36.4 C)  TempSrc:    Oral  Resp: 28 17  18   SpO2:  97% 98%  100%     Intake/Output Summary (Last 24 hours) at 03/22/15 1030 Last data filed at 03/22/15 0900  Gross per 24 hour  Intake      0 ml  Output      0 ml  Net      0 ml   There were no vitals filed for this visit.   Exam:  General exam: Moderately built and obese young female sitting up in bed, drowsy, unsteady and swaying. HEENT: Nontraumatic. Pupils equally reacting to light and accommodation. Oral mucosa extremely dry.? Scleral icterus Neck: Supple. Respiratory system: Clear to auscultation . No increased work of breathing. Cardiovascular system: S1 & S2 heard, RRR. No JVD, murmurs, gallops, clicks or pedal edema. Telemetry: Sinus rhythm  Gastrointestinal system: Abdomen is nondistended, soft and nontender. Normal bowel sounds heard. Central nervous system: extremely drowsy. Barely opens eyes and drifts back to sleep. Not oriented. Does not follow instructions. No focal neurological deficits. Extremities: spontaneously moves all extremities.   Data Reviewed: Basic Metabolic Panel:  Recent Labs Lab 03/22/15 0008 03/22/15 0905  NA 136 139  K 3.2* 3.2*  CL 96* 101  CO2 23 22  GLUCOSE 117* 115*  BUN 67* 60*  CREATININE 7.09* 6.41*  CALCIUM 11.1* 10.4*   Liver Function Tests:  Recent Labs Lab 03/22/15 0008 03/22/15 0905  AST 43* 39  ALT 24 23  ALKPHOS 54 46  BILITOT 5.3* 4.7*  PROT 9.4* 8.3*  ALBUMIN 3.6 3.2*   No results for input(s): LIPASE, AMYLASE in the last 168 hours.  Recent Labs Lab 03/22/15 0009  AMMONIA 214*   CBC:  Recent Labs Lab 03/22/15 0008 03/22/15 0905  WBC 7.0 5.9  HGB 10.6* 9.5*  HCT 31.0* 27.0*  MCV 95.4 94.1  PLT 94* 76*   Cardiac Enzymes: No results for input(s): CKTOTAL, CKMB, CKMBINDEX, TROPONINI in the last 168 hours. BNP (last 3 results) No results for input(s): PROBNP in the last 8760 hours. CBG:  Recent Labs Lab 03/21/15 2338  GLUCAP 108*    No results found for this or any previous visit (from the past 240  hour(s)).         Studies: US Renal  03/22/2015  CLINICAL DATA:  Renal disease, hypertension EXAM: RENAL / URINARY TRACT ULTRASOUND COMPLETE COMPARISON:  CT 02/20/2015 FINDINGS: Right Kidney: Length: 11.1 cm. Echogenicity within normal limits. No mass or hydronephrosis visualized. Left Kidney: Length: 10.7 cm. Echogenicity within normal limits. No mass or hydronephrosis visualized. Bladder: Appears normal for degree of bladder distention. IMPRESSION: Normal renal ultrasound. Electronically Signed   By: Charlett Nose M.D.   On: 03/22/2015 09:10        Scheduled Meds: . lactulose  300 mL Rectal Once  . pantoprazole (PROTONIX) IV  40 mg Intravenous Q12H   Continuous Infusions: . sodium chloride      Principal Problem:   Altered mental status Active Problems:   Portal hypertension (HCC)   Encephalopathy, hepatic (HCC)   Hepatic cirrhosis (HCC)   Thrombocytopenia (HCC)   Anemia, chronic disease   Altered mental state    Time spent: 50 minutes.    Marcellus Scott, MD, FACP, FHM. [facility] Pager [phone]  If 7PM-7AM, please contact night-coverage [email or web address] Password [email or web address] 03/22/2015, 10:30 AM    LOS: 0 days

## 2015-03-22 NOTE — Consult Note (Signed)
Reason for Consult: Cirrhosis Referring Physician: Hospital team  Julie Rowland is an 45 y.o. female.  HPI: Patient seen and examined and case discussed with her boyfriend who does not have too many answers to my questions and her hospital computer chart was reviewed and she has seen the [facility] gastroenterologist as well as the other group here but has not been referred to a transplant center and currently she is admitted with encephalopathy and renal failure but no signs of bleeding but she did have a recent bout of SBP but she is unable to provide any other history   Past Medical History  Diagnosis Date  . Migraine headache     At age 87  . Hypertension Dx 2003  . Anxiety Dx 2002  . Seizures ([place]) 2014 and 2009  . Portal hypertension ([place])   . Ascites 10/2014.    Small volume, not enough to tap  . Hepatic encephalopathy ([place]) 02/2015.    Past Surgical History  Procedure Laterality Date  . Colonoscopy  2015    Hyperplastic polyp. Random biopsies negative for collagenous/microscopic colitis. Performed at [facility].  . Vein ligation and stripping N/A 07/14/2014    Procedure: EXCISION OF VARICOSE VEINS AT UMBILICUS;  Surgeon: Rosetta Posner, MD;  Location: [place];  Service: Vascular;  Laterality: N/A;  . Umbilical hernia repair N/A 07/14/2014    Procedure: HERNIA REPAIR UMBILICAL ADULT;  Surgeon: Rosetta Posner, MD;  Location: [facility] OR;  Service: Vascular;  Laterality: N/A;  . Esophagogastroduodenoscopy (egd) with propofol N/A 12/07/2014    [id number] portal hypertensive gastropathy/grade 3 varices  . Esophageal banding N/A 12/07/2014    Procedure: ESOPHAGEAL BANDING (4 bands);  Surgeon: Danie Binder, MD;  Location: [facility];  Service: Endoscopy;  Laterality: N/A;  . Esophagogastroduodenoscopy (egd) with propofol N/A 02/08/2015    Procedure: ESOPHAGOGASTRODUODENOSCOPY (EGD) WITH PROPOFOL;  Surgeon: Danie Binder, MD;  Location: [facility];  Service: Endoscopy;  Laterality: N/A;   1030  . Gastric varices banding N/A 02/08/2015    Procedure: GASTRIC VARICES BANDING;  Surgeon: Danie Binder, MD;  Location: [facility];  Service: Endoscopy;  Laterality: N/A;  3 bands  . Esophagogastroduodenoscopy (egd) with propofol N/A 02/23/2015    Procedure: ESOPHAGOGASTRODUODENOSCOPY (EGD) WITH PROPOFOL;  Surgeon: Manus Gunning, MD;  Location: [place];  Service: Gastroenterology;  Laterality: N/A;    Family History  Problem Relation Age of Onset  . Hypertension Mother   . Cancer Mother   . Diabetes Mother   . Colon cancer Neg Hx   . Liver disease Neg Hx     Social History:  reports that she quit smoking about 7 months ago. Her smoking use included Cigarettes. She quit after 20 years of use. She has never used smokeless tobacco. She reports that she drinks alcohol. She reports that she does not use illicit drugs.  Allergies: No Known Allergies  Medications: I have reviewed the patient's current medications.  Results for orders placed or performed during the hospital encounter of 03/22/15 (from the past 48 hour(s))  CBG monitoring, ED     Status: Abnormal   Collection Time: 03/21/15 11:38 PM  Result Value Ref Range   Glucose-Capillary 108 (H) 65 - 99 mg/dL  Comprehensive metabolic panel     Status: Abnormal   Collection Time: 03/22/15 12:08 AM  Result Value Ref Range   Sodium 136 135 - 145 mmol/L   Potassium 3.2 (L) 3.5 - 5.1 mmol/L   Chloride 96 (  L) 101 - 111 mmol/L   CO2 23 22 - 32 mmol/L   Glucose, Bld 117 (H) 65 - 99 mg/dL   BUN 67 (H) 6 - 20 mg/dL   Creatinine, Ser 7.09 (H) 0.44 - 1.00 mg/dL   Calcium 11.1 (H) 8.9 - 10.3 mg/dL   Total Protein 9.4 (H) 6.5 - 8.1 g/dL   Albumin 3.6 3.5 - 5.0 g/dL   AST 43 (H) 15 - 41 U/L   ALT 24 14 - 54 U/L   Alkaline Phosphatase 54 38 - 126 U/L   Total Bilirubin 5.3 (H) 0.3 - 1.2 mg/dL   GFR calc non Af Amer 6 (L) >60 mL/min   GFR calc Af Amer 7 (L) >60 mL/min    Comment: (NOTE) The eGFR has been calculated using the  CKD EPI equation. This calculation has not been validated in all clinical situations. eGFR's persistently <60 mL/min signify possible Chronic Kidney Disease.    Anion gap 17 (H) 5 - 15  CBC     Status: Abnormal   Collection Time: 03/22/15 12:08 AM  Result Value Ref Range   WBC 7.0 4.0 - 10.5 K/uL   RBC 3.25 (L) 3.87 - 5.11 MIL/uL   Hemoglobin 10.6 (L) 12.0 - 15.0 g/dL   HCT 31.0 (L) 36.0 - 46.0 %   MCV 95.4 78.0 - 100.0 fL   MCH 32.6 26.0 - 34.0 pg   MCHC 34.2 30.0 - 36.0 g/dL   RDW 14.8 11.5 - 15.5 %   Platelets 94 (L) 150 - 400 K/uL    Comment: PLATELET COUNT CONFIRMED BY SMEAR SPECIMEN CHECKED FOR CLOTS REPEATED TO VERIFY   Ammonia     Status: Abnormal   Collection Time: 03/22/15 12:09 AM  Result Value Ref Range   Ammonia 214 (H) 9 - 35 umol/L  Protime-INR     Status: Abnormal   Collection Time: 03/22/15  1:26 AM  Result Value Ref Range   Prothrombin Time 18.2 (H) 11.6 - 15.2 seconds   INR 1.51 (H) 0.00 - 1.49  Lactic acid, plasma     Status: Abnormal   Collection Time: 03/22/15  3:24 AM  Result Value Ref Range   Lactic Acid, Venous 2.1 (HH) 0.5 - 2.0 mmol/L    Comment: CRITICAL RESULT CALLED TO, READ BACK BY AND VERIFIED WITHTheodis Sato [phone] WILDERK   Urinalysis, Routine w reflex microscopic (not at [facility])     Status: Abnormal   Collection Time: 03/22/15  4:00 AM  Result Value Ref Range   Color, Urine AMBER (A) YELLOW    Comment: BIOCHEMICALS MAY BE AFFECTED BY COLOR   APPearance CLOUDY (A) CLEAR   Specific Gravity, Urine 1.012 1.005 - 1.030   pH 5.5 5.0 - 8.0   Glucose, UA NEGATIVE NEGATIVE mg/dL   Hgb urine dipstick LARGE (A) NEGATIVE   Bilirubin Urine NEGATIVE NEGATIVE   Ketones, ur NEGATIVE NEGATIVE mg/dL   Protein, ur NEGATIVE NEGATIVE mg/dL   Nitrite NEGATIVE NEGATIVE   Leukocytes, UA NEGATIVE NEGATIVE  Urine microscopic-add on     Status: Abnormal   Collection Time: 03/22/15  4:00 AM  Result Value Ref Range   Squamous Epithelial / LPF 0-5 (A)  NONE SEEN   WBC, UA 0-5 0 - 5 WBC/hpf   RBC / HPF TOO NUMEROUS TO COUNT 0 - 5 RBC/hpf   Bacteria, UA FEW (A) NONE SEEN   Casts HYALINE CASTS (A) NEGATIVE  Lactic acid, plasma     Status: Abnormal  Collection Time: 03/22/15  7:11 AM  Result Value Ref Range   Lactic Acid, Venous 2.9 (HH) 0.5 - 2.0 mmol/L    Comment: CRITICAL RESULT CALLED TO, READ BACK BY AND VERIFIED WITH: KAYLA PRICE,RN AT 0804 03/22/15 BY ZBEECH.   Comprehensive metabolic panel     Status: Abnormal   Collection Time: 03/22/15  9:05 AM  Result Value Ref Range   Sodium 139 135 - 145 mmol/L   Potassium 3.2 (L) 3.5 - 5.1 mmol/L   Chloride 101 101 - 111 mmol/L   CO2 22 22 - 32 mmol/L   Glucose, Bld 115 (H) 65 - 99 mg/dL   BUN 60 (H) 6 - 20 mg/dL   Creatinine, Ser 6.41 (H) 0.44 - 1.00 mg/dL   Calcium 10.4 (H) 8.9 - 10.3 mg/dL   Total Protein 8.3 (H) 6.5 - 8.1 g/dL   Albumin 3.2 (L) 3.5 - 5.0 g/dL   AST 39 15 - 41 U/L   ALT 23 14 - 54 U/L   Alkaline Phosphatase 46 38 - 126 U/L   Total Bilirubin 4.7 (H) 0.3 - 1.2 mg/dL   GFR calc non Af Amer 7 (L) >60 mL/min   GFR calc Af Amer 8 (L) >60 mL/min    Comment: (NOTE) The eGFR has been calculated using the CKD EPI equation. This calculation has not been validated in all clinical situations. eGFR's persistently <60 mL/min signify possible Chronic Kidney Disease.    Anion gap 16 (H) 5 - 15  CBC     Status: Abnormal   Collection Time: 03/22/15  9:05 AM  Result Value Ref Range   WBC 5.9 4.0 - 10.5 K/uL   RBC 2.87 (L) 3.87 - 5.11 MIL/uL   Hemoglobin 9.5 (L) 12.0 - 15.0 g/dL   HCT 27.0 (L) 36.0 - 46.0 %   MCV 94.1 78.0 - 100.0 fL   MCH 33.1 26.0 - 34.0 pg   MCHC 35.2 30.0 - 36.0 g/dL   RDW 15.0 11.5 - 15.5 %   Platelets 76 (L) 150 - 400 K/uL    Comment: PLATELET COUNT CONFIRMED BY SMEAR  Magnesium     Status: None   Collection Time: 03/22/15 12:05 PM  Result Value Ref Range   Magnesium 2.1 1.7 - 2.4 mg/dL  Glucose, capillary     Status: Abnormal   Collection  Time: 03/22/15  1:23 PM  Result Value Ref Range   Glucose-Capillary 109 (H) 65 - 99 mg/dL    US Renal  03/22/2015  CLINICAL DATA:  Renal disease, hypertension EXAM: RENAL / URINARY TRACT ULTRASOUND COMPLETE COMPARISON:  CT 02/20/2015 FINDINGS: Right Kidney: Length: 11.1 cm. Echogenicity within normal limits. No mass or hydronephrosis visualized. Left Kidney: Length: 10.7 cm. Echogenicity within normal limits. No mass or hydronephrosis visualized. Bladder: Appears normal for degree of bladder distention. IMPRESSION: Normal renal ultrasound. Electronically Signed   By: Rolm Baptise M.D.   On: 03/22/2015 09:10   Dg Chest Port 1 View  03/22/2015  CLINICAL DATA:  44 year old female with altered mental status, confusion. Initial encounter. EXAM: PORTABLE CHEST 1 VIEW COMPARISON:  06/24/2014 and earlier. FINDINGS: Portable AP semi upright views at 1053 hours. Stable to mildly lower lung volumes. Allowing for portable technique, the lungs are clear. No pneumothorax. Normal cardiac size and mediastinal contours. Visualized tracheal air column is within normal limits. IMPRESSION: No acute cardiopulmonary abnormality. Electronically Signed   By: Genevie Ann M.D.   On: 03/22/2015 11:04    ROS negative except above  Blood pressure 128/57, pulse 88, temperature 97.7 F (36.5 C), temperature source Oral, resp. rate 14, height 5' 9"  (1.753 m), weight 91.853 kg (202 lb 8 oz), SpO2 100 %. Physical Exam patient is smiling at me but not answering questions and her exam is pertinent for her abdomen being soft nontender without significant ascites labs reviewed  Assessment/Plan:  cirrhosis and renal failure probable encephalopathy Plan: I think the patient would be best served by transferring to a transplant center and she definitely needs a renal consult and a set of urine lites to rule in or out acute renal failure versus hepatorenal and the meantime continue lactulose and consider empiric antibiotics or diagnostic tap to  rule out SBP and look for signs of other infection or causes of her altered mental status and will follow with you while here would recommend transfer as above   [place] E 03/22/2015, 3:19 PM

## 2015-03-22 NOTE — Progress Notes (Signed)
Pt transferred to unit 2200. Pt is having disorientation x 4, pulling at lines and attempting to get out of bed. NP made aware, pt does have a headache and is having some visual disturbances. I asked pt when her last drink was and she said "not long ago". Nurse Tech at bedside to monitor pt.

## 2015-03-22 NOTE — Consult Note (Signed)
[facility] Consultation Note Requesting Physician:  Dr. Waymon AmatoHongalgi Reason for Consult:  AKI on CKD  HPI: The patient is a 44 y.o. year-old female with history of NASH/alcoholic cirrhosis complicated by portal hypertension, esophageal varices, hepatic encephalopathy & coagulopathy, HTN, pancytopenia, seizures, depression. Hospitalized  02/19/15-02/23/15 for abdominal pain, suspected SBP, had EGD which showed esophageal ulcerations & hepatic encephalopathy, was discharged on lactulose, PPI, sucralfate, Lasix and Aldactone. Creatinine at time of discharge 2.6 and stable there. Pt developed progressive somnolence, worsening jaundice, confusion over the past few days , and on evaluation in the ED had creatinine of 7, ammonia of 214. We were asked to see for AKI.   Renal US showed normal sized kidneys with normal echogenicity. UA neg for protein but showed large HB, TNTC RBC. K low at 3.2, CO2 normal.  Ca elevated 11.1.   Weight 02/19/15 during last admission 238 lb on 11/6 ->202 lb today. Has received IVF hydration and no UOP is recorded at all but creatinine has dropped 7.09->6.4. Bladder scan suggests 500 cc urine in the bladder.  Calcium has come down to 10.4.  No urine electrolytes available. No documented hypotension. No known NSAID use.  Has not had any recent paracentesis and in fact on 02/20/15 US of last admission had only small volume ascites  The patient cannot provide any history on her own.    Creatinine Summary For Reference CREATININE, SER  Date/Time Value Ref Range Status  03/22/2015 09:05 AM 6.41* 0.44 - 1.00 mg/dL Final  11/91/478212/09/2014 95:6212:08 AM 7.09* 0.44 - 1.00 mg/dL Final  13/08/657811/12/2014 46:9607:14 AM 2.91* 0.44 - 1.00 mg/dL Final  29/52/841311/11/2014 24:4005:15 AM 2.98* 0.44 - 1.00 mg/dL Final  10/27/253611/10/2014 64:4011:50 AM 2.91* 0.44 - 1.00 mg/dL Final  34/74/259511/10/2014 63:8705:49 AM 2.78* 0.44 - 1.00 mg/dL Final  56/43/329511/09/2014 18:8412:44 PM 2.40* 0.44 - 1.00 mg/dL Final  16/60/630111/08/2014 60:1011:49 PM 2.34* 0.44 - 1.00 mg/dL Final   93/23/557310/20/2016 22:0202:00 PM 2.25* 0.44 - 1.00 mg/dL Final  54/27/062303/18/2016 76:2806:45 AM 0.76 0.50 - 1.10 mg/dL Final  31/51/761603/17/2016 07:3712:22 AM 0.64 0.50 - 1.10 mg/dL Final  10/62/694812/28/2015 54:6212:57 PM 0.64 0.50 - 1.10 mg/dL Final  70/35/009301/22/2013 81:8208:48 PM 0.76 0.50 - 1.10 mg/dL Final  99/37/169609/16/2010 78:9305:10 PM 0.73 0.4 - 1.2 mg/dL Final  81/01/751009/08/2008 25:8506:36 AM 0.89 0.4 - 1.2 mg/dL Final  27/78/242309/07/2008 53:6105:38 PM 1.15  0.4 - 1.2 mg/dL Final  44/31/540009/07/2008 86:7604:53 AM 0.74 0.4 - 1.2 mg/dL Final     Past Medical History  Diagnosis Date  . Migraine headache     At age 44  . Hypertension Dx 2003  . Anxiety Dx 2002  . Seizures ([facility]) 2014 and 2009  . Portal hypertension ([facility])   . Ascites 10/2014.    Small volume, not enough to tap  . Hepatic encephalopathy ([facility]) 02/2015.      Past Surgical History  Procedure Laterality Date  . Colonoscopy  2015    Hyperplastic polyp. Random biopsies negative for collagenous/microscopic colitis. [facility].  . Vein ligation and stripping N/A 07/14/2014    Procedure: EXCISION OF VARICOSE VEINS AT UMBILICUS;  Surgeon: Larina Earthlyodd F Early, MD;  [facility];  Service: Vascular;  Laterality: N/A;  . Umbilical hernia repair N/A 07/14/2014    Procedure: HERNIA REPAIR UMBILICAL ADULT;  Surgeon: Larina Earthlyodd F Early, MD;  [facility];  Service: Vascular;  Laterality: N/A;  . Esophagogastroduodenoscopy (egd) with propofol N/A 12/07/2014    [id number]:moderate portal hypertensive gastropathy/grade 3 varices  .  Esophageal banding N/A 12/07/2014    Procedure: ESOPHAGEAL BANDING (4 bands);  Surgeon: West Bali, MD;  Location: [facility];  Service: Endoscopy;  Laterality: N/A;  . Esophagogastroduodenoscopy (egd) with propofol N/A 02/08/2015    Procedure: ESOPHAGOGASTRODUODENOSCOPY (EGD) WITH PROPOFOL;  Surgeon: West Bali, MD;  Location: [facility];  Service: Endoscopy;  Laterality: N/A;  1030  . Gastric varices banding N/A 02/08/2015    Procedure: GASTRIC VARICES BANDING;  Surgeon: West Bali, MD;  Location[facility];  Service: Endoscopy;  Laterality: N/A;  3 bands  . Esophagogastroduodenoscopy (egd) with propofol N/A 02/23/2015    Procedure: ESOPHAGOGASTRODUODENOSCOPY (EGD) WITH PROPOFOL;  Surgeon: Ruffin Frederick, MD;  Location: [facility] ENDOSCOPY;  Service: Gastroenterology;  Laterality: N/A;     Family History  Problem Relation Age of Onset  . Hypertension Mother   . Cancer Mother   . Diabetes Mother   . Colon cancer Neg Hx   . Liver disease Neg Hx    Social History:  reports that she quit smoking about 7 months ago. Her smoking use included Cigarettes. She quit after 20 years of use. She has never used smokeless tobacco. She reports that she drinks alcohol. She reports that she does not use illicit drugs.  Allergies: No Known Allergies  Home medications: Prior to Admission medications   Medication Sig Start Date End Date Taking? Authorizing Provider  furosemide (LASIX) 40 MG tablet WITH ALDACTONE 100 MG DAILY Patient taking differently: Take 40 mg by mouth daily.  02/21/15  Yes Calvert Cantor, MD  lactulose (CHRONULAC) 10 GM/15ML solution Take 30 mLs (20 g total) by mouth 2 (two) times daily. 02/28/15  Yes [name], MD  pantoprazole (PROTONIX) 40 MG tablet Take 1 tablet (40 mg total) by mouth 2 (two) times daily. 02/23/15  Yes Penny Pia, MD  sertraline (ZOLOFT) 25 MG tablet Take 1 tablet (25 mg total) by mouth daily. 03/03/15  Yes [name], MD  spironolactone (ALDACTONE) 100 MG tablet Take 1 tablet (100 mg total) by mouth daily. 02/21/15  Yes Calvert Cantor, MD  zolpidem (AMBIEN) 10 MG tablet Take 1 tablet (10 mg total) by mouth at bedtime as needed for sleep. Patient not taking: Reported on 03/22/2015 03/03/15   Dessa Phi, MD    Inpatient medications: . pantoprazole (PROTONIX) IV  40 mg Intravenous Q12H   . sodium chloride 125 mL/hr at 03/22/15 1101    Review of Systems Unobtainable as pt unable    Physical Exam:  BP 128/57 mmHg  Pulse 88  Temp(Src) 97.7 F  (36.5 C) (Oral)  Resp 14  Ht  (1.753 m)  Wt 91.853 kg (202 lb 8 oz)  BMI 29.89 kg/m2  SpO2 100%  Gen: Icteric sclerae. Smiles and says a few words but cannot really answer questions. Skin: no rash, cyanosis but icteric. Neck: No JVD Chest: Clear Heart: Regular S1S2 No S3.  Abdomen: soft, no distension. Bladder percussible and palpation over bladder elicits some discomfort. Ext: No edema. Neuro:Awake, but somnolent, cannot assess orientation   Labs: Basic Metabolic Panel:  Recent Labs Lab 03/22/15 0008 03/22/15 0905  NA 136 139  K 3.2* 3.2*  CL 96* 101  CO2 23 22  GLUCOSE 117* 115*  BUN 67* 60*  CREATININE 7.09* 6.41*  CALCIUM 11.1* 10.4*     Recent Labs Lab 03/22/15 0008 03/22/15 0905  AST 43* 39  ALT 24 23  ALKPHOS 54 46  BILITOT 5.3* 4.7*  PROT 9.4* 8.3*  ALBUMIN 3.6  3.2*    Recent Labs Lab 03/22/15 0009  AMMONIA 214*   CBC:  Recent Labs Lab 03/22/15 0008 03/22/15 0905  WBC 7.0 5.9  HGB 10.6* 9.5*  HCT 31.0* 27.0*  MCV 95.4 94.1  PLT 94* 76*     Recent Labs Lab 03/21/15 2338 03/22/15 1323  GLUCAP 108* 109*    Xrays/Other Studies:  US Renal  03/22/2015  CLINICAL DATA:  Renal disease, hypertension EXAM: RENAL / URINARY TRACT ULTRASOUND COMPLETE COMPARISON:  CT 02/20/2015 FINDINGS: Right Kidney: Length: 11.1 cm. Echogenicity within normal limits. No mass or hydronephrosis visualized. Left Kidney: Length: 10.7 cm. Echogenicity within normal limits. No mass or hydronephrosis visualized. Bladder: Appears normal for degree of bladder distention. IMPRESSION: Normal renal ultrasound. Electronically Signed   By: Charlett Nose M.D.   On: 03/22/2015 09:10   Dg Chest Port 1 View  03/22/2015  CLINICAL DATA:  44 year old female with altered mental status, confusion. Initial encounter. EXAM: PORTABLE CHEST 1 VIEW COMPARISON:  06/24/2014 and earlier. FINDINGS: Portable AP semi upright views at 1053 hours. Stable to mildly lower lung volumes.  Allowing for portable technique, the lungs are clear. No pneumothorax. Normal cardiac size and mediastinal contours. Visualized tracheal air column is within normal limits. IMPRESSION: No acute cardiopulmonary abnormality. Electronically Signed   By: Odessa Fleming M.D.   On: 03/22/2015 11:04    Background: 44 year old female patient with history of [place]/alcoholic cirrhosis complicated by portal hypertension, esophageal varices, hepatic encephalopathy & coagulopathy, HTN, pancytopenia, seizures, depression. Hospitalized  02/19/15-02/23/15 for abdominal pain, suspected SBP. EGD showed esophageal ulcerations & hepatic encephalopathy. Was discharged on lactulose, PPI, sucralfate, furosemide and spironolactone. Creatinine at time of discharge was 2.6. Pt developed progressive somnolence, worsening jaundice, confusion over 2-3 days PTA, and on evaluation in the ED had creatinine of 7, ammonia of 214. We were asked to see.   Impression/Recommendations  1. AKI on CKD4  - Inadequate database to infer HRS, but clearly volume depletion playing a role in her AKI - weight is down a total of 36 lb since 02/20/15 (and she had only small volume ascites during that admission, and provider notes indicate some edema just  in RLE on 11/8) Agree with IV hydration. Would place foley for UOP monitoring and to obtain urine for electrolytes.   Agree with holding all diuretics.  2. Volume depletion. See #1. Weight down 36 lb since last admission.  3. Hypokalemia - replaced by primary service 4. Hypercalcemia - unknown if taking Ca or Vitamin D as outpt. Was normal during 02/2015 admission.  Improving with hydration. Would not necessarily expect elevated Ca just on the basis of volume depletion.   5. Acute encephalopathy - hepatic +/- renal component.   6. Cirrhosis 2/2 NASH with protal HTN, varices, coagululopathy - agree with Dr. Ewing Schlein that she should be evaluated at a transplant center particularly given her young age - re candidacy  for possible liver transplant. 7. Anemia  Thanks for consult and will follow closely with you.   Camille Bal,  MD [facility] [phone] pager 03/22/2015, 3:17 PM

## 2015-03-22 NOTE — Progress Notes (Signed)
Addendum  Reviewed repeat labs. Nephrology and GI input appreciated.  Bicarbonate down to 18. Anion gap: 17. Lactate: Pending. High AG likely secondary to lactic acidosis. Discussed with nephrology and changed IV fluids to bicarbonate drip. Renal failure gradually improving. Hypokalemia replaced.  Clinically patient did not seem to have significant ascites. In any event, based upon GI input, we'll start empiric Rocephin 1 g IV daily and request ultrasound abdomen to evaluate for ascites. If no significant ascites, DC Rocephin in a.m. Chest x-ray shows no acute findings. Urine microscopy-not significant for UTI.  Follow-up labs show improvement and patient remains hemodynamically stable. Continue current care overnight and reassess in a.m. & determine need for transfer to tertiary center/transplant center & timing of same.  Marcellus ScottHONGALGI,Talynn Lebon, MD, FACP, FHM. [facility] Pager [phone]  If 7PM-7AM, please contact night-coverage [email or web address] Password [email or web address] 03/22/2015, 5:40 PM

## 2015-03-23 DIAGNOSIS — K7031 Alcoholic cirrhosis of liver with ascites: Secondary | ICD-10-CM

## 2015-03-23 DIAGNOSIS — N179 Acute kidney failure, unspecified: Secondary | ICD-10-CM | POA: Insufficient documentation

## 2015-03-23 LAB — COMPREHENSIVE METABOLIC PANEL
ALBUMIN: 3.1 g/dL — AB (ref 3.5–5.0)
ALT: 23 U/L (ref 14–54)
AST: 42 U/L — AB (ref 15–41)
Alkaline Phosphatase: 48 U/L (ref 38–126)
Anion gap: 15 (ref 5–15)
BUN: 56 mg/dL — AB (ref 6–20)
CALCIUM: 10.2 mg/dL (ref 8.9–10.3)
CO2: 24 mmol/L (ref 22–32)
CREATININE: 5.62 mg/dL — AB (ref 0.44–1.00)
Chloride: 100 mmol/L — ABNORMAL LOW (ref 101–111)
GFR calc Af Amer: 10 mL/min — ABNORMAL LOW (ref >60)
GFR calc non Af Amer: 8 mL/min — ABNORMAL LOW (ref >60)
GLUCOSE: 136 mg/dL — AB (ref 65–99)
Potassium: 3 mmol/L — ABNORMAL LOW (ref 3.5–5.1)
SODIUM: 139 mmol/L (ref 135–145)
Total Bilirubin: 4.6 mg/dL — ABNORMAL HIGH (ref 0.3–1.2)
Total Protein: 8.4 g/dL — ABNORMAL HIGH (ref 6.5–8.1)

## 2015-03-23 LAB — PROTIME-INR
INR: 1.68 — ABNORMAL HIGH (ref 0.00–1.49)
Prothrombin Time: 19.8 seconds — ABNORMAL HIGH (ref 11.6–15.2)

## 2015-03-23 LAB — AMMONIA: Ammonia: 132 umol/L — ABNORMAL HIGH (ref 9–35)

## 2015-03-23 LAB — CBC
HCT: 28.4 % — ABNORMAL LOW (ref 36.0–46.0)
Hemoglobin: 9.7 g/dL — ABNORMAL LOW (ref 12.0–15.0)
MCH: 32.8 pg (ref 26.0–34.0)
MCHC: 34.2 g/dL (ref 30.0–36.0)
MCV: 95.9 fL (ref 78.0–100.0)
PLATELETS: 87 10*3/uL — AB (ref 150–400)
RBC: 2.96 MIL/uL — AB (ref 3.87–5.11)
RDW: 15.2 % (ref 11.5–15.5)
WBC: 8.2 10*3/uL (ref 4.0–10.5)

## 2015-03-23 LAB — UREA NITROGEN, URINE: UREA NITROGEN UR: 407 mg/dL

## 2015-03-23 LAB — VITAMIN D 25 HYDROXY (VIT D DEFICIENCY, FRACTURES): VIT D 25 HYDROXY: 43.3 ng/mL (ref 30.0–100.0)

## 2015-03-23 LAB — PARATHYROID HORMONE, INTACT (NO CA): PTH: 36 pg/mL (ref 15–65)

## 2015-03-23 MED ORDER — POTASSIUM CHLORIDE 10 MEQ/100ML IV SOLN
10.0000 meq | INTRAVENOUS | Status: AC
  Administered 2015-03-23 (×3): 10 meq via INTRAVENOUS
  Filled 2015-03-23 (×3): qty 100

## 2015-03-23 MED ORDER — LACTULOSE ENEMA
300.0000 mL | Freq: Two times a day (BID) | ORAL | Status: DC
  Filled 2015-03-23 (×3): qty 300

## 2015-03-23 MED ORDER — LACTULOSE 10 GM/15ML PO SOLN
20.0000 g | Freq: Two times a day (BID) | ORAL | Status: DC
  Administered 2015-03-23 – 2015-03-25 (×4): 20 g via ORAL
  Filled 2015-03-23 (×7): qty 30

## 2015-03-23 MED ORDER — SODIUM CHLORIDE 0.9 % IV SOLN
INTRAVENOUS | Status: DC
  Administered 2015-03-24: 16:00:00 via INTRAVENOUS
  Administered 2015-03-24: 1000 mL via INTRAVENOUS
  Administered 2015-03-25 – 2015-03-26 (×3): via INTRAVENOUS

## 2015-03-23 NOTE — Progress Notes (Addendum)
[facility] Rounding Note  Subjective:   Foley placed yesterday UOP 1100/24 hours Boyfriend also in with her and he says said his name this morning Was up all night -sleeping now  Objective:     Vital signs in last 24 hours: Filed Vitals:   03/22/15 2328 03/23/15 0406 03/23/15 0500 03/23/15 0837  BP:  144/89  143/73  Pulse: 104 105  94  Temp: 98.1 F (36.7 C) 99 F (37.2 C)  97.9 F (36.6 C)  TempSrc: Oral Oral  Axillary  Resp: 18 15  13   Height:      Weight:   94.1 kg (207 lb 7.3 oz)   SpO2: 99% 99%  97%   Weight change:   Intake/Output Summary (Last 24 hours) at 03/23/15 0956 Last data filed at 03/23/15 0800  Gross per 24 hour  Intake 2487.5 ml  Output   1165 ml  Net 1322.5 ml    Physical Exam:  Blood pressure 143/73, pulse 94, temperature 97.9 F (36.6 C), temperature source Axillary, resp. rate 13, height 5\' 9"  (1.753 m), weight 94.1 kg (207 lb 7.3 oz), SpO2 97 %. She remains somewhat somnolent and can't really converse. Responds to pain. Icteric sclerae Regular S1S2 No S3 Lung fields clear No abd tenderness No edema SCD's in place Foley dark yellow urine   Recent Labs Lab 03/22/15 0008 03/22/15 0905 03/22/15 1523 03/22/15 1645 03/23/15 0236  NA 136 139 141 139 139  K 3.2* 3.2* 3.7 3.5 3.0*  CL 96* 101 106 103 100*  CO2 23 22 18* 20* 24  GLUCOSE 117* 115* 106* 108* 136*  BUN 67* 60* 64* 62* 56*  CREATININE 7.09* 6.41* 6.14* 6.17* 5.62*  CALCIUM 11.1* 10.4* 10.5* 10.5* 10.2     Recent Labs Lab 03/22/15 0008 03/22/15 0905 03/23/15 0236  AST 43* 39 42*  ALT 24 23 23   ALKPHOS 54 46 48  BILITOT 5.3* 4.7* 4.6*  PROT 9.4* 8.3* 8.4*  ALBUMIN 3.6 3.2* 3.1*     Recent Labs Lab 03/22/15 0009 03/23/15 0236  AMMONIA 214* 132*     Recent Labs Lab 03/22/15 0008 03/22/15 0905 03/23/15 0236  WBC 7.0 5.9 8.2  HGB 10.6* 9.5* 9.7*  HCT 31.0* 27.0* 28.4*  MCV 95.4 94.1 95.9  PLT 94* 76* 87*      Recent Labs Lab  03/21/15 2338 03/22/15 1323  GLUCAP 108* 109*      Studies/Results: Kore[place] Renal  03/22/2015  CLINICAL DATA:  Renal disease, hypertension EXAM: RENAL / URINARY TRACT ULTRASOUND COMPLETE COMPARISON:  CT 02/20/2015 FINDINGS: Right Kidney: Length: 11.1 cm. Echogenicity within normal limits. No mass or hydronephrosis visualized. Left Kidney: Length: 10.7 cm. Echogenicity within normal limits. No mass or hydronephrosis visualized. Bladder: Appears normal for degree of bladder distention. IMPRESSION: Normal renal ultrasound. Electronically Signed   By: Charlett NoseKevin  Dover M.D.   On: 03/22/2015 09:10   [place] Abdomen Limited  03/22/2015  CLINICAL DATA:  Hepatic cirrhosis. Abdominal distention. Evaluate for ascites. EXAM: LIMITED ABDOMEN ULTRASOUND FOR ASCITES TECHNIQUE: Limited ultrasound survey for ascites was performed in all four abdominal quadrants. COMPARISON:  None. FINDINGS: No ascites visualized within the abdomen or pelvis. IMPRESSION: No evidence of ascites. Electronically Signed   By: Myles RosenthalJohn  Stahl M.D.   On: 03/22/2015 23:34   Dg Chest Port 1 View  03/22/2015  CLINICAL DATA:  44 year old female with altered mental status, confusion. Initial encounter. EXAM: PORTABLE CHEST 1 VIEW COMPARISON:  06/24/2014 and earlier. FINDINGS: Portable AP semi upright  views at 1053 hours. Stable to mildly lower lung volumes. Allowing for portable technique, the lungs are clear. No pneumothorax. Normal cardiac size and mediastinal contours. Visualized tracheal air column is within normal limits. IMPRESSION: No acute cardiopulmonary abnormality. Electronically Signed   By: Odessa Fleming M.D.   On: 03/22/2015 11:04   Medications . sodium chloride 75 mL/hr at 03/23/15 0800   . cefTRIAXone (ROCEPHIN)  IV  2 g Intravenous Q24H  . lactulose  300 mL Rectal BID  . pantoprazole (PROTONIX) IV  40 mg Intravenous Q12H   Background: 44 year old female patient with history of Nash/alcoholic cirrhosis complicated by portal hypertension,  esophageal varices, hepatic encephalopathy & coagulopathy, HTN, pancytopenia, seizures, depression. Hospitalized 02/19/15-02/23/15 for abdominal pain, suspected SBP. EGD showed esophageal ulcerations & hepatic encephalopathy. Was discharged on lactulose, PPI, sucralfate, furosemide and spironolactone. Creatinine at time of discharge was 2.6. Pt developed progressive somnolence, worsening jaundice, confusion over 2-3 days PTA, and on evaluation in the ED had creatinine of 7, ammonia of 214. We were asked to see  Impression/Recomendations  1. AKI on CKD4 - Creatinine falling with volume repletion which was clearly playing a role here (36 lb drop in weight since 02/22/15). Curiously FeNa was NOT low/ Coupled with falling creatinine and increased UOP this excludes (for now anyway) HRS as cause of her renal failure. IVF's changed to isotonic bicarb late yesterday d/t low CO2  - improved  today and I note appropriately changed back to NS. Diuretics on hold. (no edema and on abd [place] no ascites either) 2. Volume depletion. See #1. Weight down 36 lb since last admission.  3. Hypokalemia - needs additional K replacement. Ordered 3 runs for today. 4. Hypercalcemia - unknown if taking Ca or Vitamin D as outpt. Was normal during 02/2015 admission. Improved some with hydration. Would not necessarily expect elevated Ca just on the basis of volume depletion. 25D normal and PTH 35.  Ca still 10.2...  5. Acute encephalopathy - hepatic +/- renal component.  6. Cirrhosis 2/2 NASH with protal HTN, varices, coagululopathy - agree with Dr. Ewing Schlein that she should be evaluated at a transplant center particularly given her young age - re candidacy for possible liver transplant. 7. Anemia - has worsened some with hydration. Check Fe studies (normal 02/21/15     Camille Bal, MD [facility] [phone] Pager 03/23/2015, 9:56 AM

## 2015-03-23 NOTE — Progress Notes (Addendum)
Julie MerlMichelle L Rowland 11:29 AM  Subjective: Patient just a little more alert today but does fall off to sleep fairly quickly but no specific complaints and no abdominal pain and she does have mitts on which make checking for a flap difficult Objective: Vital signs stable afebrile no acute distress abdomen is soft no obvious ascites nontender occasional bowel sounds BUN and creatinine and bili all decreased other labs stable  Assessment: Cirrhosis and acute renal failure and encephalopathy  Plan: When she is just a little more awake can consider trying lactulose by mouth and then slowly advance diet and will follow with you but again would look into transferring to a university setting and based on the pictures of her last endoscopy a month ago if needed you could pass a feeding tube for meds and nutrition [facility],Darrell Hauk E  Pager [phone] After 5PM or if no answer call [phone]

## 2015-03-23 NOTE — Progress Notes (Signed)
[facility] PROGRESS NOTE    Progress Note   Julie Rowland ZOX:[id number] DOB: 03/01/1971 DOA: 03/22/2015 PCP: Lora Paula, MD   Brief Narrative:   Julie Rowland is an 44 y.o. female with past medical history of Nash/alcoholic cirrhosis with portal hypertension and esophageal varices recently discharged from the hospital on 03/05/2015 for abdominal pain and suspected his BP underwent EGD that showed esophageal ulceration and hepatic encephalopathy. At home developed progressive somnolence and jaundice came back to the ED on 12/5 2016. As per family having 3 bowel movements a day no reports of fevers. On admission his creatinine was 7 (on discharge it was 2.6)  Assessment/Plan:   Acute Encephalopathy, hepatic (HCC) due Hepatic cirrhosis (HCC): Makes pressure probably due to uremia and hepatic encephalopathy. She has been getting lactulose regularly, seems nonfocal and physical exam. Has remained afebrile. She still somnolent, receive 1 mg of Ativan overnight not following command has no asterixis. Should not get Ativan. Patient should be kept nothing by mouth. Continue lactulose enemas.  Acute on chronic kidney disease On admission creatinine was 7.0 On d/c  her creatinine was 2.6. At home she was discharged on Lasix and Aldactone. Her creatinine is prerenal with IV hydration urinary sodium is greater than 10. Fractional excretion of sodium is greater than 1 after she had been off IV fluid hydration. Likely prerenal in etiology. Ultrasound showed no hydronephrosis, urinary sodium was greater than 10. Continue strict I's and O's and daily weights. Nephrology was consulted who recommended IV hydration. Creatinine is improving slowly, with IV hydration. Will discuss with renal to see me change IV fluids to normal saline. Her MELD is 12.  Alcoholic cirrhosis/Portal hypertension (HCC), given by portal hypertension, esophageal varices: Continue IV Protonix. She  had a recent EGD that showed an esophageal ulcer. Appreciate GIs assistance.   Metabolic acidosis: Likely due to tissue perfusion and liver disease. Continue IV hydration. Her blood pressure has been stable.  Hypercalcemia: Likely due to dehydration continue IV fluid hydration.  Anemia, chronic disease andThrombocytopenia (HCC) Hemoglobin seems to be stable as well as thrombocytopenia. Likely due to liver disease and possibly a previous recent episode of bleeding.  DVT Prophylaxis SCD'd  Family Communication: none Disposition Plan: Home when stable. Code Status:     Code Status Orders        Start     Ordered   03/22/15 0653  Full code   Continuous     03/22/15 0652        IV Access:    Peripheral IV   Procedures and diagnostic studies:   US Renal  03/22/2015  CLINICAL DATA:  Renal disease, hypertension EXAM: RENAL / URINARY TRACT ULTRASOUND COMPLETE COMPARISON:  CT 02/20/2015 FINDINGS: Right Kidney: Length: 11.1 cm. Echogenicity within normal limits. No mass or hydronephrosis visualized. Left Kidney: Length: 10.7 cm. Echogenicity within normal limits. No mass or hydronephrosis visualized. Bladder: Appears normal for degree of bladder distention. IMPRESSION: Normal renal ultrasound. Electronically Signed   By: Charlett Nose M.D.   On: 03/22/2015 09:10   US Abdomen Limited  03/22/2015  CLINICAL DATA:  Hepatic cirrhosis. Abdominal distention. Evaluate for ascites. EXAM: LIMITED ABDOMEN ULTRASOUND FOR ASCITES TECHNIQUE: Limited ultrasound survey for ascites was performed in all four abdominal quadrants. COMPARISON:  None. FINDINGS: No ascites visualized within the abdomen or pelvis. IMPRESSION: No evidence of ascites. Electronically Signed   By: Myles Rosenthal M.D.   On: 03/22/2015 23:34   Dg Chest Port 1 View  03/22/2015  CLINICAL DATA:  44 year old female with altered mental status, confusion. Initial encounter. EXAM: PORTABLE CHEST 1 VIEW COMPARISON:  06/24/2014 and  earlier. FINDINGS: Portable AP semi upright views at 1053 hours. Stable to mildly lower lung volumes. Allowing for portable technique, the lungs are clear. No pneumothorax. Normal cardiac size and mediastinal contours. Visualized tracheal air column is within normal limits. IMPRESSION: No acute cardiopulmonary abnormality. Electronically Signed   By: Odessa Fleming M.D.   On: 03/22/2015 11:04     Medical Consultants:    None.  Anti-Infectives:   Anti-infectives    Start     Dose/Rate Route Frequency Ordered Stop   03/22/15 1900  cefTRIAXone (ROCEPHIN) 2 g in dextrose 5 % 50 mL IVPB     2 g 100 mL/hr over 30 Minutes Intravenous Every 24 hours 03/22/15 1736        Subjective:    Julie Rowland   Objective:    Filed Vitals:   03/22/15 2100 03/22/15 2328 03/23/15 0406 03/23/15 0500  BP: 138/72  144/89   Pulse: 93 104 105   Temp: 98.5 F (36.9 C) 98.1 F (36.7 C) 99 F (37.2 C)   TempSrc: Oral Oral Oral   Resp: Height:  (1.753 m)     Weight: 94 kg (207 lb 3.7 oz)   94.1 kg (207 lb 7.3 oz)  SpO2: 96% 99% 99%     Intake/Output Summary (Last 24 hours) at 03/23/15 0800 Last data filed at 03/23/15 0600  Gross per 24 hour  Intake 2237.5 ml  Output   1165 ml  Net 1072.5 ml   Filed Weights   03/22/15 0711 03/22/15 2100 03/23/15 0500  Weight: 91.853 kg (202 lb 8 oz) 94 kg (207 lb 3.7 oz) 94.1 kg (207 lb 7.3 oz)    Exam: Gen:  NAD, somnolent, not to follow commands Cardiovascular:  RRR. Chest and lungs:   CTAB Abdomen:  Abdomen soft, NT/ND, + BS Extremities:  No C/E/C Logical exam: Nonfocal no asterixis   Data Reviewed:    Labs: Basic Metabolic Panel:  Recent Labs Lab 03/22/15 0008 03/22/15 0905 03/22/15 1205 03/22/15 1523 03/22/15 1645 03/23/15 0236  NA 136 139  --  141 139 139  K 3.2* 3.2*  --  3.7 3.5 3.0*  CL 96* 101  --  106 103 100*  CO2 23 22  --  18* 20* 24  GLUCOSE 117* 115*  --  106* 108* 136*  BUN 67* 60*  --  64* 62* 56*    CREATININE 7.09* 6.41*  --  6.14* 6.17* 5.62*  CALCIUM 11.1* 10.4*  --  10.5* 10.5* 10.2  MG  --   --  2.1  --   --   --    GFR Estimated Creatinine Clearance: 15.6 mL/min (by C-G formula based on Cr of 5.62). Liver Function Tests:  Recent Labs Lab 03/22/15 0008 03/22/15 0905 03/23/15 0236  AST 43* 39 42*  ALT ALKPHOS 54 46 48  BILITOT 5.3* 4.7* 4.6*  PROT 9.4* 8.3* 8.4*  ALBUMIN 3.6 3.2* 3.1*   No results for input(s): LIPASE, AMYLASE in the last 168 hours.  Recent Labs Lab 03/22/15 0009 03/23/15 0236  AMMONIA 214* 132*   Coagulation profile  Recent Labs Lab 03/22/15 0126 03/23/15 0236  INR 1.51* 1.68*    CBC:  Recent Labs Lab 03/22/15 0008 03/22/15 0905 03/23/15 0236  WBC 7.0 5.9 8.2  HGB 10.6* 9.5* 9.7*  HCT 31.0* 27.0* 28.4*  MCV 95.4 94.1 95.9  PLT 94* 76* 87*   Cardiac Enzymes: No results for input(s): CKTOTAL, CKMB, CKMBINDEX, TROPONINI in the last 168 hours. BNP (last 3 results) No results for input(s): PROBNP in the last 8760 hours. CBG:  Recent Labs Lab 03/21/15 2338 03/22/15 1323  GLUCAP 108* 109*   D-Dimer: No results for input(s): DDIMER in the last 72 hours. Hgb A1c: No results for input(s): HGBA1C in the last 72 hours. Lipid Profile: No results for input(s): CHOL, HDL, LDLCALC, TRIG, CHOLHDL, LDLDIRECT in the last 72 hours. Thyroid function studies: No results for input(s): TSH, T4TOTAL, T3FREE, THYROIDAB in the last 72 hours.  Invalid input(s): FREET3 Anemia work up: No results for input(s): VITAMINB12, FOLATE, FERRITIN, TIBC, IRON, RETICCTPCT in the last 72 hours. Sepsis Labs:  Recent Labs Lab 03/22/15 0008 03/22/15 0324 03/22/15 0711 03/22/15 0905 03/22/15 1645 03/23/15 0236  WBC 7.0  --   --  5.9  --  8.2  LATICACIDVEN  --  2.1* 2.9*  --  2.7*  --    Microbiology Recent Results (from the past 240 hour(s))  MRSA PCR Screening     Status: None   Collection Time: 03/22/15  1:20 PM  Result Value Ref  Range Status   MRSA by PCR NEGATIVE NEGATIVE Final    Comment:        The GeneXpert MRSA Assay (FDA approved for NASAL specimens only), is one component of a comprehensive MRSA colonization surveillance program. It is not intended to diagnose MRSA infection nor to guide or monitor treatment for MRSA infections.      Medications:   . cefTRIAXone (ROCEPHIN)  IV  2 g Intravenous Q24H  . lactulose  300 mL Rectal BID  . pantoprazole (PROTONIX) IV  40 mg Intravenous Q12H   Continuous Infusions: . sodium chloride      Time spent: 25 min   LOS: 1 day   Marinda ElkFELIZ ORTIZ, ABRAHAM  [facility] Pager [phone]  *Please refer to [email or web address], password [email or web address] to get updated schedule on who will round on this patient, as hospitalists switch teams weekly. If 7PM-7AM, please contact night-coverage at [email or web address], password [email or web address] for any overnight needs.  03/23/2015, 8:00 AM

## 2015-03-23 NOTE — Progress Notes (Signed)
Safety sitter requested for the night, unable to redirect pt and keep her from at pulling lines. Pt is attempting to get out of bed and a high fall risk. Safety sitter to come to 2C18.

## 2015-03-24 DIAGNOSIS — K7030 Alcoholic cirrhosis of liver without ascites: Secondary | ICD-10-CM

## 2015-03-24 LAB — RENAL FUNCTION PANEL
ALBUMIN: 2.8 g/dL — AB (ref 3.5–5.0)
ANION GAP: 13 (ref 5–15)
BUN: 49 mg/dL — AB (ref 6–20)
CALCIUM: 10 mg/dL (ref 8.9–10.3)
CO2: 25 mmol/L (ref 22–32)
Chloride: 103 mmol/L (ref 101–111)
Creatinine, Ser: 4.86 mg/dL — ABNORMAL HIGH (ref 0.44–1.00)
GFR calc Af Amer: 12 mL/min — ABNORMAL LOW (ref >60)
GFR, EST NON AFRICAN AMERICAN: 10 mL/min — AB (ref >60)
Glucose, Bld: 92 mg/dL (ref 65–99)
PHOSPHORUS: 3.3 mg/dL (ref 2.5–4.6)
POTASSIUM: 3.3 mmol/L — AB (ref 3.5–5.1)
SODIUM: 141 mmol/L (ref 135–145)

## 2015-03-24 MED ORDER — POTASSIUM CHLORIDE 10 MEQ/100ML IV SOLN
10.0000 meq | INTRAVENOUS | Status: AC
  Administered 2015-03-24 (×3): 10 meq via INTRAVENOUS
  Filled 2015-03-24 (×3): qty 100

## 2015-03-24 MED ORDER — INFLUENZA VAC SPLIT QUAD 0.5 ML IM SUSY
0.5000 mL | PREFILLED_SYRINGE | INTRAMUSCULAR | Status: AC
  Administered 2015-03-25: 0.5 mL via INTRAMUSCULAR
  Filled 2015-03-24: qty 0.5

## 2015-03-24 MED ORDER — PNEUMOCOCCAL VAC POLYVALENT 25 MCG/0.5ML IJ INJ
0.5000 mL | INJECTION | INTRAMUSCULAR | Status: AC
  Administered 2015-03-25: 0.5 mL via INTRAMUSCULAR
  Filled 2015-03-24: qty 0.5

## 2015-03-24 NOTE — Progress Notes (Signed)
[facility] Rounding Note  Subjective:   Much more awake, conversant, aware that her kidney function had been compromised and happy to hear of continue improvement Per sitter had restless night but denies pain or SOB this AM Continued good UOP with 2 liters out past 24 hours  Objective:     Vital signs in last 24 hours: Filed Vitals:   03/24/15 0000 03/24/15 0200 03/24/15 0400 03/24/15 0406  BP: 116/56 112/57 131/59   Pulse:      Temp:   98.6 F (37 C)   TempSrc:   Oral   Resp: Height:      Weight:    92.7 kg (204 lb 5.9 oz)  SpO2:   98%    Weight change: 0.847 kg (1 lb 13.9 oz)  Intake/Output Summary (Last 24 hours) at 03/24/15 0657 Last data filed at 03/24/15 0655  Gross per 24 hour  Intake   2100 ml  Output   2100 ml  Net      0 ml    Physical Exam:  Blood pressure 131/59, pulse 88, temperature 98.6 F (37 C), temperature source Oral, resp. rate 12, height  (1.753 m), weight 92.7 kg (204 lb 5.9 oz), SpO2 98 %.  Awake, soft spoken, conversant this AM Icteric sclerae Regular S1S2 No S3 Lung fields clear No abd tenderness No edema SCD's in place Foley dark yellow urine - 700 emptied while I was in the   Recent Labs Lab 03/22/15 0008 03/22/15 0905 03/22/15 1523 03/22/15 1645 03/23/15 0236 03/24/15 0426  NA 136 139 141 139 139 141  K 3.2* 3.2* 3.7 3.5 3.0* 3.3*  CL 96* 101 106 103 100* 103  CO2 23 22 18* 20* 24 25  GLUCOSE 117* 115* 106* 108* 136* 92  BUN 67* 60* 64* 62* 56* 49*  CREATININE 7.09* 6.41* 6.14* 6.17* 5.62* 4.86*  CALCIUM 11.1* 10.4* 10.5* 10.5* 10.2 10.0  PHOS  --   --   --   --   --  3.3     Recent Labs Lab 03/22/15 0008 03/22/15 0905 03/23/15 0236 03/24/15 0426  AST 43* 39 42*  --   ALT --   ALKPHOS 54 46 48  --   BILITOT 5.3* 4.7* 4.6*  --   PROT 9.4* 8.3* 8.4*  --   ALBUMIN 3.6 3.2* 3.1* 2.8*     Recent Labs Lab 03/22/15 0009 03/23/15 0236  AMMONIA 214* 132*     Recent  Labs Lab 03/22/15 0008 03/22/15 0905 03/23/15 0236  WBC 7.0 5.9 8.2  HGB 10.6* 9.5* 9.7*  HCT 31.0* 27.0* 28.4*  MCV 95.4 94.1 95.9  PLT 94* 76* 87*      Recent Labs Lab 03/21/15 2338 03/22/15 1323  GLUCAP 108* 109*      Studies/Results: US Renal  03/22/2015  CLINICAL DATA:  Renal disease, hypertension EXAM: RENAL / URINARY TRACT ULTRASOUND COMPLETE COMPARISON:  CT 02/20/2015 FINDINGS: Right Kidney: Length: 11.1 cm. Echogenicity within normal limits. No mass or hydronephrosis visualized. Left Kidney: Length: 10.7 cm. Echogenicity within normal limits. No mass or hydronephrosis visualized. Bladder: Appears normal for degree of bladder distention. IMPRESSION: Normal renal ultrasound. Electronically Signed   By: Charlett Nose M.D.   On: 03/22/2015 09:10   US Abdomen Limited  03/22/2015  CLINICAL DATA:  Hepatic cirrhosis. Abdominal distention. Evaluate for ascites. EXAM: LIMITED ABDOMEN ULTRASOUND FOR ASCITES TECHNIQUE: Limited ultrasound survey for ascites was performed in  all four abdominal quadrants. COMPARISON:  None. FINDINGS: No ascites visualized within the abdomen or pelvis. IMPRESSION: No evidence of ascites. Electronically Signed   By: Myles RosenthalJohn  Stahl M.D.   On: 03/22/2015 23:34   Dg Chest Port 1 View  03/22/2015  CLINICAL DATA:  44 year old female with altered mental status, confusion. Initial encounter. EXAM: PORTABLE CHEST 1 VIEW COMPARISON:  06/24/2014 and earlier. FINDINGS: Portable AP semi upright views at 1053 hours. Stable to mildly lower lung volumes. Allowing for portable technique, the lungs are clear. No pneumothorax. Normal cardiac size and mediastinal contours. Visualized tracheal air column is within normal limits. IMPRESSION: No acute cardiopulmonary abnormality. Electronically Signed   By: Odessa FlemingH  Hall M.D.   On: 03/22/2015 11:04   Medications . sodium chloride 1,000 mL (03/24/15 0358)   . cefTRIAXone (ROCEPHIN)  IV  2 g Intravenous Q24H  . lactulose  20 g Oral BID   . pantoprazole (PROTONIX) IV  40 mg Intravenous Q12H   Background: 44 year old female patient with history of Nash/alcoholic cirrhosis complicated by portal hypertension, esophageal varices, hepatic encephalopathy & coagulopathy, HTN, pancytopenia, seizures, depression. Hospitalized 02/19/15-02/23/15 for abdominal pain, suspected SBP. EGD showed esophageal ulcerations & hepatic encephalopathy. Was discharged on lactulose, PPI, sucralfate, furosemide and spironolactone. Creatinine at time of discharge was 2.6-2.9. Pt developed progressive somnolence, worsening jaundice, confusion over 2-3 days PTA, and on evaluation in the ED had creatinine of 7, ammonia of 214. We were asked to see. Ultrasound normal sized 11.1, 10.7 cm kidneys w/normal echogenicity, UPC 160 mg so minimal)  Impression/Recomendations  1. AKI on CKD4 - Creatinine continues to improve with IVF volume repletion with no evidence at this time of any volume excess. UOP also good. Volume repletion which was clearly playing a role here (36 lb drop in weight since 02/22/15 from 238 at last hosp->202 on admission).(No edema and no ascites on US this adm)   Curiously FeNa was NOT low but this may have been because of diuretics in her system.  Good (and increasing) UOP coupled with continued improvement in renal function excludes HRS as part of her renal failure (at least at this juncture). Continue IVF's (NS still looks OK for replacement). Continue to monitor UOP.  2. Hypokalemia - needs additional K replacement. Ordered 3 more runs for today . 3. Hypercalcemia - unknown if taking Ca or Vitamin D as outpt. Was normal during 02/2015 admission. Improved some with hydration. Would not necessarily expect elevated Ca just on the basis of volume depletion. 25D normal and PTH 35.  Ca slowly normalizing.   4. Acute encephalopathy - hepatic +/- renal component. Ammonia falling. MS improving.  5. Cirrhosis 2/2 NASH with protal HTN, varices,  coagululopathy - agree with Dr. Ewing SchleinMagod that she should be evaluated at a transplant center particularly given her young age - re candidacy for possible liver transplant.  6. Anemia - has worsened some with hydration. Checking Fe studies (normal 02/21/15)   I have ordered some additional K replacement for today.  Given the continued improvement in renal function and avoidance of the need for dialysis, I have little else to add. Hopefully she will recover to her most recent baseline (2.6 02/2015) if not better. Renal will sign off at this point, but if there are additional questions that I can help with please don't hesitate to call me.Camille Bal.   Navi Erber, MD [facility] [phone] Pager 03/24/2015, 6:57 AM

## 2015-03-24 NOTE — Progress Notes (Signed)
Attempted report with [id number]. Spoke with nurse for patient and was told that they would feel more comfortable, given the patient's history of impulsivity and safety sitter, placing the patient in a room with a camera. There is an empty room available , but it needs to be cleaned. Per RN, will call me to get report once the room becomes available.  Noe GensStefanie A Elenie Coven, RN

## 2015-03-24 NOTE — Progress Notes (Signed)
NURSING PROGRESS NOTE  Charlean MerlMichelle L Gwynn [id number] Transfer Data: 03/24/2015 4:40 PM Attending Provider: Marinda ElkAbraham Feliz Ortiz, MD XBM:[id number]:FUNCHES, Ellison CarwinJOSALYN C, MD Code Status: full   Charlean MerlMichelle L Hendel is a 44 y.o. female patient transferred from [facility]  -No acute distress noted.  -No complaints of shortness of breath.  -No complaints of chest pain.   Cardiac Monitoring: Box # 0 in place. Cardiac monitor yields:no tele.  Blood pressure 145/68, pulse 88, temperature 98.1 F (36.7 C), temperature source Oral, resp. rate 16, height 5\' 9"  (1.753 m), weight 92.7 kg (204 lb 5.9 oz), SpO2 95 %.   IV Fluids:  IV in place, occlusive dsg intact without redness, IV cath antecubital right, condition patent and no redness normal saline.   Allergies:  Review of patient's allergies indicates no known allergies.  Past Medical History:   has a past medical history of Migraine headache; Hypertension (Dx 2003); Anxiety (Dx 2002); Seizures (HCC) (2014 and 2009); Portal hypertension (HCC); Ascites (10/2014.); and Hepatic encephalopathy (HCC) (02/2015.).  Past Surgical History:   has past surgical history that includes Colonoscopy (2015); Vein ligation and stripping (N/A, 07/14/2014); Umbilical hernia repair (N/A, 07/14/2014); Esophagogastroduodenoscopy (egd) with propofol (N/A, 12/07/2014); esophageal banding (N/A, 12/07/2014); Esophagogastroduodenoscopy (egd) with propofol (N/A, 02/08/2015); gastric varices banding (N/A, 02/08/2015); and Esophagogastroduodenoscopy (egd) with propofol (N/A, 02/23/2015).  Social History:   reports that she quit smoking about 7 months ago. Her smoking use included Cigarettes. She quit after 20 years of use. She has never used smokeless tobacco. She reports that she drinks alcohol. She reports that she does not use illicit drugs.  Skin: no issues but encouraged patient to turn q2 hours to prevent breakdown  Patient/Family orientated to room. Information packet given to  patient/family. Admission inpatient armband information verified with patient/family to include name and date of birth and placed on patient arm. Side rails up x 2, fall assessment and education completed with patient/family. Patient/family able to verbalize understanding of risk associated with falls and verbalized understanding to call for assistance before getting out of bed. Call light within reach. Patient/family able to voice and demonstrate understanding of unit orientation instructions.    Will continue to evaluate and treat per MD orders.

## 2015-03-24 NOTE — Progress Notes (Signed)
[facility]  Awaiting patient arrival

## 2015-03-24 NOTE — Progress Notes (Signed)
Discussed patient care with patient's mom.  Mom is concerned about pt being discharged before she is ready.  Mom just wants to make sure that pt is completely better before being discharged

## 2015-03-24 NOTE — Progress Notes (Signed)
Report given to Shanda BumpsJessica, RN for [phone]. Patient transported via bed with boyfriend and belongings. Belongings included clothing and cell phone. Patient removed from telemetry at new bedside and CCMD notified.  Noe GensStefanie A Elliot Simoneaux, RN

## 2015-03-24 NOTE — Progress Notes (Signed)
[facility] PROGRESS NOTE    Progress Note   Julie Rowland ZOX:[id number] DOB: 10/10/1970 DOA: 03/22/2015 PCP: Lora PaulaFUNCHES, JOSALYN C, MD   Brief Narrative:   Julie MerlMichelle L Demby is an 44 y.o. female with past medical history of Nash/alcoholic cirrhosis with portal hypertension and esophageal varices recently discharged from the hospital on 03/05/2015 for abdominal pain and suspected his BP underwent EGD that showed esophageal ulceration and hepatic encephalopathy. At home developed progressive somnolence and jaundice came back to the ED on 12/5 2016. As per family having 3 bowel movements a day no reports of fevers. On admission his creatinine was 7 (on discharge it was 2.6)  Assessment/Plan:   Acute Encephalopathy, hepatic (HCC) due Hepatic cirrhosis (HCC): Most likely due to uremia and hepatic encephalopathy. Now on lactulose regularly. Has no asterixis. Should not get Ativan.  Acute on chronic kidney disease On admission creatinine was 7.0 On last admission Cr was 2.6. Likely prerenal in etiology. On 11.8.2016 weight was 108.2 kg on admission 94.1 kg, FENA > 1 % , U sodium > 10. Nephrology was consulted who recommended IV hydration. Now on NS cr improving slowly. Her MELD is 12.  Alcoholic cirrhosis/Portal hypertension (HCC), given by portal hypertension, esophageal varices: Continue IV Protonix. She had a recent EGD that showed an esophageal ulcer. Appreciate GIs assistance.   Metabolic acidosis: Likely due to tissue perfusion and liver disease. Resolved.  Hypercalcemia: Likely due to dehydration continue IV fluid hydration.  Anemia, chronic disease andThrombocytopenia (HCC) Hemoglobin seems to be stable as well as thrombocytopenia. Likely due to liver disease and possibly a previous recent episode of bleeding.  DVT Prophylaxis SCD'd  Family Communication: none Disposition Plan: transfer to med surg Code Status:     Code Status Orders        Start     Ordered   03/22/15 0653  Full code   Continuous     03/22/15 0652        IV Access:    Peripheral IV   Procedures and diagnostic studies:   [place] Renal  03/22/2015  CLINICAL DATA:  Renal disease, hypertension EXAM: RENAL / URINARY TRACT ULTRASOUND COMPLETE COMPARISON:  CT 02/20/2015 FINDINGS: Right Kidney: Length: 11.1 cm. Echogenicity within normal limits. No mass or hydronephrosis visualized. Left Kidney: Length: 10.7 cm. Echogenicity within normal limits. No mass or hydronephrosis visualized. Bladder: Appears normal for degree of bladder distention. IMPRESSION: Normal renal ultrasound. Electronically Signed   By: Charlett NoseKevin  Dover M.D.   On: 03/22/2015 09:10   [place] Abdomen Limited  03/22/2015  CLINICAL DATA:  Hepatic cirrhosis. Abdominal distention. Evaluate for ascites. EXAM: LIMITED ABDOMEN ULTRASOUND FOR ASCITES TECHNIQUE: Limited ultrasound survey for ascites was performed in all four abdominal quadrants. COMPARISON:  None. FINDINGS: No ascites visualized within the abdomen or pelvis. IMPRESSION: No evidence of ascites. Electronically Signed   By: Myles RosenthalJohn  Stahl M.D.   On: 03/22/2015 23:34   Dg Chest Port 1 View  03/22/2015  CLINICAL DATA:  44 year old female with altered mental status, confusion. Initial encounter. EXAM: PORTABLE CHEST 1 VIEW COMPARISON:  06/24/2014 and earlier. FINDINGS: Portable AP semi upright views at 1053 hours. Stable to mildly lower lung volumes. Allowing for portable technique, the lungs are clear. No pneumothorax. Normal cardiac size and mediastinal contours. Visualized tracheal air column is within normal limits. IMPRESSION: No acute cardiopulmonary abnormality. Electronically Signed   By: Odessa FlemingH  Hall M.D.   On: 03/22/2015 11:04     Medical Consultants:    None.  Anti-Infectives:  Anti-infectives    Start     Dose/Rate Route Frequency Ordered Stop   03/22/15 1900  cefTRIAXone (ROCEPHIN) 2 g in dextrose 5 % 50 mL IVPB     2 g 100 mL/hr over 30 Minutes  Intravenous Every 24 hours 03/22/15 1736        Subjective:    Julie Merl awake today able to carry on a conversation.  Objective:    Filed Vitals:   03/24/15 0000 03/24/15 0200 03/24/15 0400 03/24/15 0406  BP: 116/56 112/57 131/59   Pulse:      Temp:   98.6 F (37 C)   TempSrc:   Oral   Resp: Height:      Weight:    92.7 kg (204 lb 5.9 oz)  SpO2:   98%     Intake/Output Summary (Last 24 hours) at 03/24/15 0823 Last data filed at 03/24/15 0655  Gross per 24 hour  Intake   1850 ml  Output   2100 ml  Net   -250 ml   Filed Weights   03/22/15 2100 03/23/15 0500 03/24/15 0406  Weight: 94 kg (207 lb 3.7 oz) 94.1 kg (207 lb 7.3 oz) 92.7 kg (204 lb 5.9 oz)    Exam: Gen:  NAD, A&O x3 Cardiovascular:  RRR. Chest and lungs:   CTAB Abdomen:  Abdomen soft, NT/ND, + BS Extremities:  No C/E/C Neuro exam: Nonfocal no asterixis   Data Reviewed:    Labs: Basic Metabolic Panel:  Recent Labs Lab 03/22/15 0905 03/22/15 1205 03/22/15 1523 03/22/15 1645 03/23/15 0236 03/24/15 0426  NA 139  --  141 139 139 141  K 3.2*  --  3.7 3.5 3.0* 3.3*  CL 101  --  106 103 100* 103  CO2 22  --  18* 20* 24 25  GLUCOSE 115*  --  106* 108* 136* 92  BUN 60*  --  64* 62* 56* 49*  CREATININE 6.41*  --  6.14* 6.17* 5.62* 4.86*  CALCIUM 10.4*  --  10.5* 10.5* 10.2 10.0  MG  --  2.1  --   --   --   --   PHOS  --   --   --   --   --  3.3   GFR Estimated Creatinine Clearance: 17.9 mL/min (by C-G formula based on Cr of 4.86). Liver Function Tests:  Recent Labs Lab 03/22/15 0008 03/22/15 0905 03/23/15 0236 03/24/15 0426  AST 43* 39 42*  --   ALT --   ALKPHOS 54 46 48  --   BILITOT 5.3* 4.7* 4.6*  --   PROT 9.4* 8.3* 8.4*  --   ALBUMIN 3.6 3.2* 3.1* 2.8*   No results for input(s): LIPASE, AMYLASE in the last 168 hours.  Recent Labs Lab 03/22/15 0009 03/23/15 0236  AMMONIA 214* 132*   Coagulation profile  Recent Labs Lab 03/22/15 0126  03/23/15 0236  INR 1.51* 1.68*    CBC:  Recent Labs Lab 03/22/15 0008 03/22/15 0905 03/23/15 0236  WBC 7.0 5.9 8.2  HGB 10.6* 9.5* 9.7*  HCT 31.0* 27.0* 28.4*  MCV 95.4 94.1 95.9  PLT 94* 76* 87*   Cardiac Enzymes: No results for input(s): CKTOTAL, CKMB, CKMBINDEX, TROPONINI in the last 168 hours. BNP (last 3 results) No results for input(s): PROBNP in the last 8760 hours. CBG:  Recent Labs Lab 03/21/15 2338 03/22/15 1323  GLUCAP 108* 109*   D-Dimer: No results for  input(s): DDIMER in the last 72 hours. Hgb A1c: No results for input(s): HGBA1C in the last 72 hours. Lipid Profile: No results for input(s): CHOL, HDL, LDLCALC, TRIG, CHOLHDL, LDLDIRECT in the last 72 hours. Thyroid function studies: No results for input(s): TSH, T4TOTAL, T3FREE, THYROIDAB in the last 72 hours.  Invalid input(s): FREET3 Anemia work up: No results for input(s): VITAMINB12, FOLATE, FERRITIN, TIBC, IRON, RETICCTPCT in the last 72 hours. Sepsis Labs:  Recent Labs Lab 03/22/15 0008 03/22/15 0324 03/22/15 0711 03/22/15 0905 03/22/15 1645 03/23/15 0236  WBC 7.0  --   --  5.9  --  8.2  LATICACIDVEN  --  2.1* 2.9*  --  2.7*  --    Microbiology Recent Results (from the past 240 hour(s))  MRSA PCR Screening     Status: None   Collection Time: 03/22/15  1:20 PM  Result Value Ref Range Status   MRSA by PCR NEGATIVE NEGATIVE Final    Comment:        The GeneXpert MRSA Assay (FDA approved for NASAL specimens only), is one component of a comprehensive MRSA colonization surveillance program. It is not intended to diagnose MRSA infection nor to guide or monitor treatment for MRSA infections.      Medications:   . cefTRIAXone (ROCEPHIN)  IV  2 g Intravenous Q24H  . lactulose  20 g Oral BID  . pantoprazole (PROTONIX) IV  40 mg Intravenous Q12H  . potassium chloride  10 mEq Intravenous Q1 Hr x 3   Continuous Infusions: . sodium chloride 1,000 mL (03/24/15 0358)    Time  spent: 25 min   LOS: 2 days   Marinda Elk  [facility] Pager [phone]  *Please refer to [email or web address], password [email or web address] to get updated schedule on who will round on this patient, as hospitalists switch teams weekly. If 7PM-7AM, please contact night-coverage at [email or web address], password [email or web address] for any overnight needs.  03/24/2015, 8:23 AM

## 2015-03-24 NOTE — Progress Notes (Signed)
Julie MerlMichelle L Rowland 11:17 AM  Subjective: Patient doing much better and seems to answer questions appropriately and is tolerating her diet and has no complaints  Objective: Vital signs stable afebrile no acute distress abdomen is soft nontender she does not have a flap BUN and creatinine improved  Assessment: Cirrhosis resolving encephalopathy and acute renal failure  Plan: If she continues to improve would set her up with an outpatient liver transplant service evaluation and will check on tomorrow  [facility] Center[facility],Julie Rowland E  Pager [phone] After 5PM or if no answer call [phone]

## 2015-03-25 LAB — RENAL FUNCTION PANEL
Albumin: 2.8 g/dL — ABNORMAL LOW (ref 3.5–5.0)
Anion gap: 12 (ref 5–15)
BUN: 35 mg/dL — AB (ref 6–20)
CALCIUM: 9.6 mg/dL (ref 8.9–10.3)
CO2: 24 mmol/L (ref 22–32)
CREATININE: 4.27 mg/dL — AB (ref 0.44–1.00)
Chloride: 105 mmol/L (ref 101–111)
GFR, EST AFRICAN AMERICAN: 14 mL/min — AB (ref >60)
GFR, EST NON AFRICAN AMERICAN: 12 mL/min — AB (ref >60)
Glucose, Bld: 92 mg/dL (ref 65–99)
Phosphorus: 2.9 mg/dL (ref 2.5–4.6)
Potassium: 3.3 mmol/L — ABNORMAL LOW (ref 3.5–5.1)
SODIUM: 141 mmol/L (ref 135–145)

## 2015-03-25 LAB — AMMONIA: Ammonia: 33 umol/L (ref 9–35)

## 2015-03-25 MED ORDER — POTASSIUM CHLORIDE CRYS ER 20 MEQ PO TBCR
40.0000 meq | EXTENDED_RELEASE_TABLET | Freq: Two times a day (BID) | ORAL | Status: AC
  Administered 2015-03-25: 40 meq via ORAL
  Filled 2015-03-25 (×3): qty 2

## 2015-03-25 MED ORDER — PANTOPRAZOLE SODIUM 40 MG PO TBEC
40.0000 mg | DELAYED_RELEASE_TABLET | Freq: Two times a day (BID) | ORAL | Status: DC
  Administered 2015-03-27 – 2015-03-30 (×7): 40 mg via ORAL
  Filled 2015-03-25 (×9): qty 1

## 2015-03-25 MED ORDER — POTASSIUM CHLORIDE 10 MEQ/100ML IV SOLN
10.0000 meq | INTRAVENOUS | Status: AC
  Administered 2015-03-26 (×2): 10 meq via INTRAVENOUS
  Filled 2015-03-25 (×2): qty 100

## 2015-03-25 NOTE — Care Management Note (Signed)
Case Management Note  Patient Details  Name: Julie Rowland MRN: [id number] Date of Birth: 05/22/1970  Subjective/Objective:     Date: 03/25/15 Spoke with patient at the bedside.  Introduced self as Sports coachcase manager and explained role in discharge planning and how to be reached.  Verified patient lives in town, alone with parents.  Expressed potential need for no other DME.  Verified patient anticipates to go home with family, at time of discharge and will have full-time  supervision by family  at this time to best of their knowledge.  Patient confirmed  needing help with their medication.  Patient is driven by family to MD appointments.  [facility]  Patient 's ammonia level has been coming down, will need to make sure she can ambulate before she goes home, will need to get pt eval.   Plan: CM will continue to follow for discharge planning and [facility].                Action/Plan:   Expected Discharge Date:                  Expected Discharge Plan:  Home/Self Care  In-House Referral:     Discharge planning Services  CM Consult  Post Acute Care Choice:    Choice offered to:     DME Arranged:    DME Agency:     HH Arranged:    HH Agency:     Status of Service:  In process, will continue to follow  Medicare Important Message Given:    Date Medicare IM Given:    Medicare IM give by:    Date Additional Medicare IM Given:    Additional Medicare Important Message give by:     If discussed at Long Length of Stay Meetings, dates discussed:    Additional Comments:  Leone Havenaylor, Zoey Bidwell Clinton, RN 03/25/2015, 3:26 PM

## 2015-03-25 NOTE — Progress Notes (Addendum)
[facility] PROGRESS NOTE    Progress Note   Julie Rowland:[id number] DOB: 1970/08/11 DOA: 03/22/2015 PCP: Lora Paula, MD   Brief Narrative:   Julie Rowland is an 44 y.o. female with past medical history of Nash/alcoholic cirrhosis with portal hypertension and esophageal varices recently discharged from the hospital on 03/05/2015 for abdominal pain and suspected his BP underwent EGD that showed esophageal ulceration and hepatic encephalopathy. At home developed progressive somnolence and jaundice came back to the ED on 12/5 2016. As per family having 3 bowel movements a day no reports of fevers. On admission his creatinine was 7 (on discharge it was 2.6)  Assessment/Plan:   Acute Encephalopathy, hepatic (HCC) due Hepatic cirrhosis and AKI: Most likely due to uremia and hepatic encephalopathy. Now on lactulose regularly. Has no asterixis. Should not get Ativan.  Acute on chronic kidney disease On admission creatinine was 7.0 On last admission Cr was 2.6. Likely prerenal in etiology. Now on NS cr improving slowly. Her MELD is 12.  Alcoholic cirrhosis/Portal hypertension (HCC), given by portal hypertension, esophageal varices: Continue oral Protonix. Appreciate GIs assistance.  will need to follow up with GI as an outpatient.  Metabolic acidosis: Likely due to tissue perfusion and liver disease. Resolved.  Hypercalcemia: Likely due to dehydration continue IV fluid hydration.  Anemia, chronic disease andThrombocytopenia (HCC) Hemoglobin seems to be stable as well as thrombocytopenia. Likely due to liver disease and possibly a previous recent episode of bleeding.  DVT Prophylaxis SCD'd  Family Communication: none Disposition Plan: home in 2 days Code Status:     Code Status Orders        Start     Ordered   03/22/15 0653  Full code   Continuous     03/22/15 0652        IV Access:    Peripheral IV   Procedures and  diagnostic studies:   No results found.   Medical Consultants:    None.  Anti-Infectives:   Anti-infectives    Start     Dose/Rate Route Frequency Ordered Stop   03/22/15 1900  cefTRIAXone (ROCEPHIN) 2 g in dextrose 5 % 50 mL IVPB     2 g 100 mL/hr over 30 Minutes Intravenous Every 24 hours 03/22/15 1736        Subjective:    Julie Rowland she really was going home.  Objective:    Filed Vitals:   03/24/15 1647 03/24/15 2028 03/25/15 0500 03/25/15 0602  BP: 123/53 133/57  139/66  Pulse: 84 83  86  Temp: 98 F (36.7 C) 98.3 F (36.8 C)  98 F (36.7 C)  TempSrc: Oral Oral  Oral  Resp: 16 18  18   Height:      Weight:   91.4 kg (201 lb 8 oz)   SpO2: 100% 100%  100%    Intake/Output Summary (Last 24 hours) at 03/25/15 1217 Last data filed at 03/25/15 1045  Gross per 24 hour  Intake 1708.75 ml  Output   1325 ml  Net 383.75 ml   Filed Weights   03/24/15 0406 03/24/15 1647 03/25/15 0500  Weight: 92.7 kg (204 lb 5.9 oz) 88.905 kg (196 lb) 91.4 kg (201 lb 8 oz)    Exam: Gen:  NAD, A&O x3 Cardiovascular:  RRR. Chest and lungs:   CTAB Abdomen:  Abdomen soft, nondistended. Extremities:  No C/E/C Neuro exam: Nonfocal, no asterixis   Data Reviewed:    Labs: Basic Metabolic Panel:  Recent  Labs Lab 03/22/15 1205 03/22/15 1523 03/22/15 1645 03/23/15 0236 03/24/15 0426 03/25/15 0528  NA  --  141 139 139 141 141  K  --  3.7 3.5 3.0* 3.3* 3.3*  CL  --  106 103 100* 103 105  CO2  --  18* 20* 24 25 24   GLUCOSE  --  106* 108* 136* 92 92  BUN  --  64* 62* 56* 49* 35*  CREATININE  --  6.14* 6.17* 5.62* 4.86* 4.27*  CALCIUM  --  10.5* 10.5* 10.2 10.0 9.6  MG 2.1  --   --   --   --   --   PHOS  --   --   --   --  3.3 2.9   GFR Estimated Creatinine Clearance: 20.3 mL/min (by C-G formula based on Cr of 4.27). Liver Function Tests:  Recent Labs Lab 03/22/15 0008 03/22/15 0905 03/23/15 0236 03/24/15 0426 03/25/15 0528  AST 43* 39 42*  --   --    ALT 24 23 23   --   --   ALKPHOS 54 46 48  --   --   BILITOT 5.3* 4.7* 4.6*  --   --   PROT 9.4* 8.3* 8.4*  --   --   ALBUMIN 3.6 3.2* 3.1* 2.8* 2.8*   No results for input(s): LIPASE, AMYLASE in the last 168 hours.  Recent Labs Lab 03/22/15 0009 03/23/15 0236 03/25/15 1050  AMMONIA 214* 132* 33   Coagulation profile  Recent Labs Lab 03/22/15 0126 03/23/15 0236  INR 1.51* 1.68*    CBC:  Recent Labs Lab 03/22/15 0008 03/22/15 0905 03/23/15 0236  WBC 7.0 5.9 8.2  HGB 10.6* 9.5* 9.7*  HCT 31.0* 27.0* 28.4*  MCV 95.4 94.1 95.9  PLT 94* 76* 87*   Cardiac Enzymes: No results for input(s): CKTOTAL, CKMB, CKMBINDEX, TROPONINI in the last 168 hours. BNP (last 3 results) No results for input(s): PROBNP in the last 8760 hours. CBG:  Recent Labs Lab 03/21/15 2338 03/22/15 1323  GLUCAP 108* 109*   D-Dimer: No results for input(s): DDIMER in the last 72 hours. Hgb A1c: No results for input(s): HGBA1C in the last 72 hours. Lipid Profile: No results for input(s): CHOL, HDL, LDLCALC, TRIG, CHOLHDL, LDLDIRECT in the last 72 hours. Thyroid function studies: No results for input(s): TSH, T4TOTAL, T3FREE, THYROIDAB in the last 72 hours.  Invalid input(s): FREET3 Anemia work up: No results for input(s): VITAMINB12, FOLATE, FERRITIN, TIBC, IRON, RETICCTPCT in the last 72 hours. Sepsis Labs:  Recent Labs Lab 03/22/15 0008 03/22/15 0324 03/22/15 0711 03/22/15 0905 03/22/15 1645 03/23/15 0236  WBC 7.0  --   --  5.9  --  8.2  LATICACIDVEN  --  2.1* 2.9*  --  2.7*  --    Microbiology Recent Results (from the past 240 hour(s))  MRSA PCR Screening     Status: None   Collection Time: 03/22/15  1:20 PM  Result Value Ref Range Status   MRSA by PCR NEGATIVE NEGATIVE Final    Comment:        The GeneXpert MRSA Assay (FDA approved for NASAL specimens only), is one component of a comprehensive MRSA colonization surveillance program. It is not intended to diagnose  MRSA infection nor to guide or monitor treatment for MRSA infections.      Medications:   . cefTRIAXone (ROCEPHIN)  IV  2 g Intravenous Q24H  . lactulose  20 g Oral BID  . pantoprazole  40 mg  Oral BID   Continuous Infusions: . sodium chloride 75 mL/hr at 03/25/15 0234    Time spent: 15 min   LOS: 3 days   Marinda Elk  [facility] Pager [phone]  *Please refer to [email or web address], password [email or web address] to get updated schedule on who will round on this patient, as hospitalists switch teams weekly. If 7PM-7AM, please contact night-coverage at [email or web address], password [email or web address] for any overnight needs.  03/25/2015, 12:17 PM

## 2015-03-25 NOTE — Progress Notes (Signed)
Julie Rowland 10:50 AM  Subjective: Patient continues to improve and tolerating clear liquids and has no complaints  Objective: Vital signs stable afebrile no acute distress abdomen is soft nontender no obvious ascites no flap BUN and creatinine decreased  Assessment: Cirrhosis and renal failure  Plan: Recheck labs tomorrow and see previous notes for other recommendations  [facility],[name]  Pager [phone] After 5PM or if no answer call [phone]

## 2015-03-25 NOTE — Progress Notes (Addendum)
Patient's mom is very concerned with her mentation.  Asked to speak with doctor.  Paged Attending   2:14 PM MD explained patients condition.  Mom is an Charity fundraiserN.  She understands that Uremia is what is causing pts condition.  Mom requesting Ativan to help with relaxation  3:42 PM No response from Attending about ativan

## 2015-03-26 LAB — CBC WITH DIFFERENTIAL/PLATELET
BASOS ABS: 0.1 10*3/uL (ref 0.0–0.1)
Basophils Relative: 1 %
EOS ABS: 0.4 10*3/uL (ref 0.0–0.7)
EOS PCT: 5 %
HCT: 25.8 % — ABNORMAL LOW (ref 36.0–46.0)
Hemoglobin: 8.7 g/dL — ABNORMAL LOW (ref 12.0–15.0)
LYMPHS PCT: 18 %
Lymphs Abs: 1.4 10*3/uL (ref 0.7–4.0)
MCH: 33.2 pg (ref 26.0–34.0)
MCHC: 33.7 g/dL (ref 30.0–36.0)
MCV: 98.5 fL (ref 78.0–100.0)
Monocytes Absolute: 1 10*3/uL (ref 0.1–1.0)
Monocytes Relative: 12 %
NEUTROS PCT: 64 %
Neutro Abs: 5 10*3/uL (ref 1.7–7.7)
PLATELETS: 77 10*3/uL — AB (ref 150–400)
RBC: 2.62 MIL/uL — AB (ref 3.87–5.11)
RDW: 15.6 % — ABNORMAL HIGH (ref 11.5–15.5)
WBC: 7.8 10*3/uL (ref 4.0–10.5)

## 2015-03-26 LAB — IRON AND TIBC
IRON: 113 ug/dL (ref 28–170)
Saturation Ratios: 51 % — ABNORMAL HIGH (ref 10.4–31.8)
TIBC: 220 ug/dL — AB (ref 250–450)
UIBC: 107 ug/dL

## 2015-03-26 LAB — COMPREHENSIVE METABOLIC PANEL
ALBUMIN: 2.8 g/dL — AB (ref 3.5–5.0)
ALT: 26 U/L (ref 14–54)
AST: 46 U/L — AB (ref 15–41)
Alkaline Phosphatase: 41 U/L (ref 38–126)
Anion gap: 13 (ref 5–15)
BUN: 32 mg/dL — AB (ref 6–20)
CHLORIDE: 110 mmol/L (ref 101–111)
CO2: 19 mmol/L — ABNORMAL LOW (ref 22–32)
CREATININE: 3.72 mg/dL — AB (ref 0.44–1.00)
Calcium: 9.6 mg/dL (ref 8.9–10.3)
GFR calc Af Amer: 16 mL/min — ABNORMAL LOW (ref >60)
GFR calc non Af Amer: 14 mL/min — ABNORMAL LOW (ref >60)
GLUCOSE: 89 mg/dL (ref 65–99)
POTASSIUM: 4.2 mmol/L (ref 3.5–5.1)
SODIUM: 142 mmol/L (ref 135–145)
Total Bilirubin: 4.2 mg/dL — ABNORMAL HIGH (ref 0.3–1.2)
Total Protein: 7.6 g/dL (ref 6.5–8.1)

## 2015-03-26 LAB — FERRITIN: Ferritin: 504 ng/mL — ABNORMAL HIGH (ref 11–307)

## 2015-03-26 LAB — PROTIME-INR
INR: 1.91 — AB (ref 0.00–1.49)
Prothrombin Time: 21.8 seconds — ABNORMAL HIGH (ref 11.6–15.2)

## 2015-03-26 MED ORDER — LACTULOSE 10 GM/15ML PO SOLN
30.0000 g | Freq: Three times a day (TID) | ORAL | Status: DC
  Administered 2015-03-27 – 2015-03-30 (×8): 30 g via ORAL
  Filled 2015-03-26 (×10): qty 45

## 2015-03-26 MED ORDER — LACTULOSE 10 GM/15ML PO SOLN: 20.0000 g | Freq: Three times a day (TID) | ORAL | Status: DC

## 2015-03-26 MED ORDER — LACTULOSE ENEMA: 300.0000 mL | Freq: Two times a day (BID) | ORAL | Status: DC

## 2015-03-26 MED ORDER — RIFAXIMIN 550 MG PO TABS
550.0000 mg | ORAL_TABLET | Freq: Three times a day (TID) | ORAL | Status: DC
  Administered 2015-03-27 – 2015-03-30 (×9): 550 mg via ORAL
  Filled 2015-03-26 (×9): qty 1

## 2015-03-26 MED ORDER — SERTRALINE HCL 25 MG PO TABS
25.0000 mg | ORAL_TABLET | Freq: Every day | ORAL | Status: DC
  Administered 2015-03-27 – 2015-03-30 (×4): 25 mg via ORAL
  Filled 2015-03-26 (×4): qty 1

## 2015-03-26 MED ORDER — ZOLPIDEM TARTRATE 5 MG PO TABS: 5.0000 mg | ORAL_TABLET | Freq: Every evening | ORAL | Status: DC | PRN

## 2015-03-26 MED ORDER — LACTULOSE ENEMA
300.0000 mL | Freq: Two times a day (BID) | ORAL | Status: DC
  Administered 2015-03-26 – 2015-03-27 (×3): 300 mL via RECTAL
  Filled 2015-03-26 (×10): qty 300

## 2015-03-26 MED ORDER — HALOPERIDOL LACTATE 5 MG/ML IJ SOLN
1.0000 mg | Freq: Four times a day (QID) | INTRAMUSCULAR | Status: DC | PRN
  Administered 2015-03-26: 1 mg via INTRAVENOUS
  Filled 2015-03-26: qty 1

## 2015-03-26 MED ORDER — PHYTONADIONE 5 MG PO TABS
5.0000 mg | ORAL_TABLET | Freq: Every day | ORAL | Status: AC
  Administered 2015-03-28: 5 mg via ORAL
  Filled 2015-03-26 (×3): qty 1

## 2015-03-26 NOTE — Progress Notes (Signed)
[facility] PROGRESS NOTE    Progress Note   Julie Rowland ZOX:[id number] DOB: May 15, 1970 DOA: 03/22/2015 PCP: Lora Paula, MD   Brief Narrative:   Julie Rowland is an 44 y.o. female with past medical history of Nash/alcoholic cirrhosis with portal hypertension and esophageal varices recently discharged from the hospital on 03/05/2015 for abdominal pain and suspected his BP underwent EGD that showed esophageal ulceration and hepatic encephalopathy. At home developed progressive somnolence and jaundice came back to the ED on 12/5 2016. As per family having 3 bowel movements a day no reports of fevers. On admission his creatinine was 7 (on discharge it was 2.6)  Assessment/Plan:   Acute Encephalopathy, hepatic (HCC) due Hepatic cirrhosis and AKI: Most likely due to uremia and hepatic encephalopathy now resolved. Refusing her lactulose, add Rifaxmin. Positive asterixis.  Acute on chronic kidney disease On admission creatinine was 7.0 On last admission Cr was 2.6. Likely prerenal in etiology. To hold diuretic therapy. He continues to improve slowly, today is 3.7. Her MELD is 12.  Alcoholic cirrhosis/Portal hypertension (HCC), given by portal hypertension, esophageal varices: Continue oral Protonix. Appreciate GIs assistance. Will need to follow up with GI as an outpatient.  Metabolic acidosis: Likely due to tissue perfusion and liver disease. Resolved.  Hypercalcemia: Likely due to dehydration resolved with IV hydration.  Anemia, chronic disease andThrombocytopenia (HCC) Hemoglobin seems to be stable as well as thrombocytopenia. Likely due to liver disease and possibly a previous recent episode of bleeding.  DVT Prophylaxis SCD'd  Family Communication: none Disposition Plan: home in 2 days Code Status:     Code Status Orders        Start     Ordered   03/22/15 0653  Full code   Continuous     03/22/15 0652        IV Access:     Peripheral IV   Procedures and diagnostic studies:   No results found.   Medical Consultants:    None.  Anti-Infectives:   Anti-infectives    Start     Dose/Rate Route Frequency Ordered Stop   03/22/15 1900  cefTRIAXone (ROCEPHIN) 2 g in dextrose 5 % 50 mL IVPB     2 g 100 mL/hr over 30 Minutes Intravenous Every 24 hours 03/22/15 1736        Subjective:    Julie Rowland she really was going home.  Objective:    Filed Vitals:   03/25/15 0602 03/25/15 1240 03/25/15 2152 03/26/15 0553  BP: 139/66 129/62 144/65 146/71  Pulse: 86 96 94 88  Temp: 98 F (36.7 C) 98.3 F (36.8 C) 99.4 F (37.4 C) 97.8 F (36.6 C)  TempSrc: Oral Oral Oral Oral  Resp: Height:      Weight:      SpO2: 100% 100% 100% 100%    Intake/Output Summary (Last 24 hours) at 03/26/15 1028 Last data filed at 03/26/15 0641  Gross per 24 hour  Intake 2217.5 ml  Output   1800 ml  Net  417.5 ml   Filed Weights   03/24/15 0406 03/24/15 1647 03/25/15 0500  Weight: 92.7 kg (204 lb 5.9 oz) 88.905 kg (196 lb) 91.4 kg (201 lb 8 oz)    Exam: Gen:  NAD, pleasantly confused today, and with some paranoia Cardiovascular:  RRR. Chest and lungs:   CTAB Abdomen:  Abdomen soft, nondistended. Extremities:  No C/E/C Neuro exam: Nonfocal, positive asterixis   Data Reviewed:  Labs: Basic Metabolic Panel:  Recent Labs Lab 03/22/15 1205  03/22/15 1645 03/23/15 0236 03/24/15 0426 03/25/15 0528 03/26/15 0414  NA  --   < > 139 139 141 141 142  K  --   < > 3.5 3.0* 3.3* 3.3* 4.2  CL  --   < > 103 100* 103 105 110  CO2  --   < > 20* 19*  GLUCOSE  --   < > 108* 136* 92 92 89  BUN  --   < > 62* 56* 49* 35* 32*  CREATININE  --   < > 6.17* 5.62* 4.86* 4.27* 3.72*  CALCIUM  --   < > 10.5* 10.2 10.0 9.6 9.6  MG 2.1  --   --   --   --   --   --   PHOS  --   --   --   --  3.3 2.9  --   < > = values in this interval not displayed. GFR Estimated Creatinine  Clearance: 23.2 mL/min (by C-G formula based on Cr of 3.72). Liver Function Tests:  Recent Labs Lab 03/22/15 0008 03/22/15 0905 03/23/15 0236 03/24/15 0426 03/25/15 0528 03/26/15 0414  AST 43* 39 42*  --   --  46*  ALT --   --  26  ALKPHOS 54 46 48  --   --  41  BILITOT 5.3* 4.7* 4.6*  --   --  4.2*  PROT 9.4* 8.3* 8.4*  --   --  7.6  ALBUMIN 3.6 3.2* 3.1* 2.8* 2.8* 2.8*   No results for input(s): LIPASE, AMYLASE in the last 168 hours.  Recent Labs Lab 03/22/15 0009 03/23/15 0236 03/25/15 1050  AMMONIA 214* 132* 33   Coagulation profile  Recent Labs Lab 03/22/15 0126 03/23/15 0236 03/26/15 0414  INR 1.51* 1.68* 1.91*    CBC:  Recent Labs Lab 03/22/15 0008 03/22/15 0905 03/23/15 0236 03/26/15 0414  WBC 7.0 5.9 8.2 7.8  NEUTROABS  --   --   --  5.0  HGB 10.6* 9.5* 9.7* 8.7*  HCT 31.0* 27.0* 28.4* 25.8*  MCV 95.4 94.1 95.9 98.5  PLT 94* 76* 87* 77*   Cardiac Enzymes: No results for input(s): CKTOTAL, CKMB, CKMBINDEX, TROPONINI in the last 168 hours. BNP (last 3 results) No results for input(s): PROBNP in the last 8760 hours. CBG:  Recent Labs Lab 03/21/15 2338 03/22/15 1323  GLUCAP 108* 109*   D-Dimer: No results for input(s): DDIMER in the last 72 hours. Hgb A1c: No results for input(s): HGBA1C in the last 72 hours. Lipid Profile: No results for input(s): CHOL, HDL, LDLCALC, TRIG, CHOLHDL, LDLDIRECT in the last 72 hours. Thyroid function studies: No results for input(s): TSH, T4TOTAL, T3FREE, THYROIDAB in the last 72 hours.  Invalid input(s): FREET3 Anemia work up: No results for input(s): VITAMINB12, FOLATE, FERRITIN, TIBC, IRON, RETICCTPCT in the last 72 hours. Sepsis Labs:  Recent Labs Lab 03/22/15 0008 03/22/15 0324 03/22/15 0711 03/22/15 0905 03/22/15 1645 03/23/15 0236 03/26/15 0414  WBC 7.0  --   --  5.9  --  8.2 7.8  LATICACIDVEN  --  2.1* 2.9*  --  2.7*  --   --    Microbiology Recent Results (from the past  240 hour(s))  MRSA PCR Screening     Status: None   Collection Time: 03/22/15  1:20 PM  Result Value Ref Range Status   MRSA by PCR NEGATIVE NEGATIVE  Final    Comment:        The GeneXpert MRSA Assay (FDA approved for NASAL specimens only), is one component of a comprehensive MRSA colonization surveillance program. It is not intended to diagnose MRSA infection nor to guide or monitor treatment for MRSA infections.      Medications:   . cefTRIAXone (ROCEPHIN)  IV  2 g Intravenous Q24H  . lactulose  20 g Oral BID  . pantoprazole  40 mg Oral BID   Continuous Infusions: . sodium chloride 75 mL/hr at 03/26/15 0603    Time spent: 15 min   LOS: 4 days   Marinda ElkFELIZ ORTIZ, ABRAHAM  [facility] Pager [phone]  *Please refer to [email or web address], password [email or web address] to get updated schedule on who will round on this patient, as hospitalists switch teams weekly. If 7PM-7AM, please contact night-coverage at [email or web address], password [email or web address] for any overnight needs.  03/26/2015, 10:28 AM

## 2015-03-26 NOTE — Progress Notes (Signed)
Subjective: No voiced complaints. Remains confused, and can't answer questions appropriately.  Objective: Vital signs in last 24 hours: Temp:  [97.8 F (36.6 C)-99.4 F (37.4 C)] 97.8 F (36.6 C) (12/10 0553) Pulse Rate:  [88-94] 88 (12/10 0553) Resp:  [19] 19 (12/10 0553) BP: (144-146)/(65-71) 146/71 mmHg (12/10 0553) SpO2:  [100 %] 100 % (12/10 0553) Weight change:  Last BM Date: 03/24/15  PE: GEN:  NAD, encephalopathic, doesn't answer questions appropriately. HEENT:  Jaundiced ABD:  Protuberant, soft, mild right upper quadrant tenderness with hepatomegaly 4 cm below right costal margin along midclavicular line; no obvious ascites (but hard to tell given patient's large size) NEURO:  Mild asterixis  Lab Results:  CBC    Component Value Date/Time   WBC 7.8 03/26/2015 0414   RBC 2.62* 03/26/2015 0414   RBC 2.19* 02/21/2015 1503   HGB 8.7* 03/26/2015 0414   HCT 25.8* 03/26/2015 0414   PLT 77* 03/26/2015 0414   MCV 98.5 03/26/2015 0414   MCH 33.2 03/26/2015 0414   MCHC 33.7 03/26/2015 0414   RDW 15.6* 03/26/2015 0414   LYMPHSABS 1.4 03/26/2015 0414   MONOABS 1.0 03/26/2015 0414   EOSABS 0.4 03/26/2015 0414   BASOSABS 0.1 03/26/2015 0414   CMP     Component Value Date/Time   NA 142 03/26/2015 0414   K 4.2 03/26/2015 0414   CL 110 03/26/2015 0414   CO2 19* 03/26/2015 0414   GLUCOSE 89 03/26/2015 0414   BUN 32* 03/26/2015 0414   CREATININE 3.72* 03/26/2015 0414   CREATININE 2.61* 02/25/2015 1411   CREATININE 2.61* 02/25/2015 1411   CREATININE 0.89 12/18/2008 2030   CALCIUM 9.6 03/26/2015 0414   PROT 7.6 03/26/2015 0414   ALBUMIN 2.8* 03/26/2015 0414   AST 46* 03/26/2015 0414   ALT 26 03/26/2015 0414   ALKPHOS 41 03/26/2015 0414   BILITOT 4.2* 03/26/2015 0414   GFRNONAA 14* 03/26/2015 0414   GFRNONAA 22* 02/25/2015 1411   GFRAA 16* 03/26/2015 0414   GFRAA 25* 02/25/2015 1411   INR 1.91  Assessment:  1.  Cirrhosis, diagnosed about 6 months ago,  currently decompensated (see below).  Non-alcohol mediated fatty liver is suspected etiology, she does have history of alcohol use int he past but none for 3 years.  Slight elevation in past of ASMA.  Slight elevation of ferritin and iron % saturation in past (doubtful hemochromatosis). 2.  Acute renal failure.  Nephrology following.  Unclear if component of hepatorenal syndrome. 3.  Encephalopathy, persistent. 4.  Coagulopathy, slight interval worsening.  Plan:  1.  Labs:  Chronic hepatitis panel, iron panel, ferritin, AMA, repeat ANA and ASMA, immunoglobulins. 2.  Continue lactulose enemas and oral rifaximin. 3.  Follow INR daily. 4.  Patient's most recent ammonia is normal, but she clinically quite clearly has ongoing hepatic encephalopathy. 5.  If INR and mental status don't improve over the weekend, she needs to be transferred to tertiary center next week as inpatient acutely for liver transplant evaluation. 6.  [facility] will follow.   Freddy JakschOUTLAW,Julie Rowland 03/26/2015, 2:22 PM   Pager [phone] If no answer or after 5 PM call [phone]

## 2015-03-27 ENCOUNTER — Inpatient Hospital Stay (HOSPITAL_COMMUNITY): Payer: Medicaid Other

## 2015-03-27 DIAGNOSIS — K7469 Other cirrhosis of liver: Secondary | ICD-10-CM

## 2015-03-27 LAB — COMPREHENSIVE METABOLIC PANEL
ALT: 23 U/L (ref 14–54)
AST: 37 U/L (ref 15–41)
Albumin: 2.7 g/dL — ABNORMAL LOW (ref 3.5–5.0)
Alkaline Phosphatase: 42 U/L (ref 38–126)
Anion gap: 10 (ref 5–15)
BILIRUBIN TOTAL: 3.9 mg/dL — AB (ref 0.3–1.2)
BUN: 33 mg/dL — AB (ref 6–20)
CALCIUM: 9.9 mg/dL (ref 8.9–10.3)
CO2: 20 mmol/L — ABNORMAL LOW (ref 22–32)
Chloride: 115 mmol/L — ABNORMAL HIGH (ref 101–111)
Creatinine, Ser: 3.31 mg/dL — ABNORMAL HIGH (ref 0.44–1.00)
GFR calc Af Amer: 18 mL/min — ABNORMAL LOW (ref >60)
GFR, EST NON AFRICAN AMERICAN: 16 mL/min — AB (ref >60)
Glucose, Bld: 96 mg/dL (ref 65–99)
POTASSIUM: 4 mmol/L (ref 3.5–5.1)
Sodium: 145 mmol/L (ref 135–145)
TOTAL PROTEIN: 7.3 g/dL (ref 6.5–8.1)

## 2015-03-27 LAB — IGG, IGA, IGM
IGG (IMMUNOGLOBIN G), SERUM: 1804 mg/dL — AB (ref 700–1600)
IGM, SERUM: 123 mg/dL (ref 26–217)
IgA: 1534 mg/dL — ABNORMAL HIGH (ref 87–352)

## 2015-03-27 LAB — CERULOPLASMIN: Ceruloplasmin: 17.5 mg/dL — ABNORMAL LOW (ref 19.0–39.0)

## 2015-03-27 LAB — AMMONIA: AMMONIA: 66 umol/L — AB (ref 9–35)

## 2015-03-27 LAB — HEPATITIS B SURFACE ANTIBODY,QUALITATIVE: HEP B S AB: NONREACTIVE

## 2015-03-27 LAB — PROTIME-INR
INR: 1.92 — ABNORMAL HIGH (ref 0.00–1.49)
PROTHROMBIN TIME: 21.9 s — AB (ref 11.6–15.2)

## 2015-03-27 LAB — HEPATITIS B SURFACE ANTIGEN: Hepatitis B Surface Ag: NEGATIVE

## 2015-03-27 LAB — HEPATITIS B CORE ANTIBODY, TOTAL: Hep B Core Total Ab: NEGATIVE

## 2015-03-27 LAB — HEPATITIS C ANTIBODY

## 2015-03-27 MED ORDER — LORAZEPAM 2 MG/ML IJ SOLN
2.0000 mg | INTRAMUSCULAR | Status: DC | PRN
  Administered 2015-03-27: 2 mg via INTRAVENOUS
  Filled 2015-03-27: qty 1

## 2015-03-27 MED ORDER — VITAMIN K1 10 MG/ML IJ SOLN: 5.0000 mg | Freq: Every day | INTRAVENOUS | Status: DC

## 2015-03-27 MED ORDER — SODIUM CHLORIDE 0.9 % IV SOLN: INTRAVENOUS | Status: DC

## 2015-03-27 MED ORDER — SODIUM CHLORIDE 0.9 % IV SOLN
250.0000 mg | Freq: Two times a day (BID) | INTRAVENOUS | Status: DC
  Administered 2015-03-27 – 2015-03-28 (×2): 250 mg via INTRAVENOUS
  Filled 2015-03-27 (×3): qty 2.5

## 2015-03-27 MED ORDER — LORAZEPAM 2 MG/ML IJ SOLN
INTRAMUSCULAR | Status: AC
  Filled 2015-03-27: qty 1

## 2015-03-27 MED ORDER — LORAZEPAM 2 MG/ML IJ SOLN
INTRAMUSCULAR | Status: AC
  Administered 2015-03-27: 2 mg
  Filled 2015-03-27: qty 1

## 2015-03-27 MED ORDER — LORAZEPAM 2 MG/ML IJ SOLN: 1.0000 mg | Freq: Once | INTRAMUSCULAR | Status: DC

## 2015-03-27 NOTE — Progress Notes (Signed)
Subjective: No voiced complaints. Remains confused, but more coherent today.  Objective: Vital signs in last 24 hours: Temp:  [98 F (36.7 C)-98.7 F (37.1 C)] 98.5 F (36.9 C) (12/11 0512) Pulse Rate:  [70-89] 83 (12/11 0512) Resp:  [18] 18 (12/11 0512) BP: (143-154)/(59-86) 146/59 mmHg (12/11 0512) SpO2:  [100 %] 100 % (12/11 0512) Weight:  [91.6 kg (201 lb 15.1 oz)] 91.6 kg (201 lb 15.1 oz) (12/11 0521) Weight change:  Last BM Date: 03/26/15  PE: GEN:  Jaundiced, more coherent but is still confused SKIN:  Jaundiced, scattered telangiectasias  Lab Results: CBC    Component Value Date/Time   WBC 7.8 03/26/2015 0414   RBC 2.62* 03/26/2015 0414   RBC 2.19* 02/21/2015 1503   HGB 8.7* 03/26/2015 0414   HCT 25.8* 03/26/2015 0414   PLT 77* 03/26/2015 0414   MCV 98.5 03/26/2015 0414   MCH 33.2 03/26/2015 0414   MCHC 33.7 03/26/2015 0414   RDW 15.6* 03/26/2015 0414   LYMPHSABS 1.4 03/26/2015 0414   MONOABS 1.0 03/26/2015 0414   EOSABS 0.4 03/26/2015 0414   BASOSABS 0.1 03/26/2015 0414   CMP     Component Value Date/Time   NA 145 03/27/2015 0431   K 4.0 03/27/2015 0431   CL 115* 03/27/2015 0431   CO2 20* 03/27/2015 0431   GLUCOSE 96 03/27/2015 0431   BUN 33* 03/27/2015 0431   CREATININE 3.31* 03/27/2015 0431   CREATININE 2.61* 02/25/2015 1411   CREATININE 2.61* 02/25/2015 1411   CREATININE 0.89 12/18/2008 2030   CALCIUM 9.9 03/27/2015 0431   PROT 7.3 03/27/2015 0431   ALBUMIN 2.7* 03/27/2015 0431   AST 37 03/27/2015 0431   ALT 23 03/27/2015 0431   ALKPHOS 42 03/27/2015 0431   BILITOT 3.9* 03/27/2015 0431   GFRNONAA 16* 03/27/2015 0431   GFRNONAA 22* 02/25/2015 1411   GFRAA 18* 03/27/2015 0431   GFRAA 25* 02/25/2015 1411   INR 1.92  Assessment:  1. Cirrhosis, diagnosed about 6 months ago, currently decompensated (see below). Non-alcohol mediated fatty liver is suspected etiology, she does have history of alcohol use int he past but none for 3 years.  Slight elevation in past of ASMA. Slight elevation of ferritin and iron % saturation in past (doubtful hemochromatosis).  Repeat labs show iron saturation 50% with ferritin 500.  Hep B/C negative.   2. Acute renal failure. Slowly improving.  Nephrology following. Unclear if component of hepatorenal syndrome. 3. Encephalopathy, persistent but slowly improving. 4. Coagulopathy, stable over 24 hours.  Plan:  1.  Continue lactulose and rifaximin. 2.  Awaiting repeat autoimmune studies (AMA, ANA, ASMA). 3.  Check genetic studies for hemochromatosis. 4.  Follow mental status, renal function, INR. 5.  Would initiate discussion with transplant center tomorrow, to decide about disposition (transfer as inpatient to liver transplant center versus expedited outpatient liver transplant center evaluation). 6.  [facility] will follow.   Freddy JakschOUTLAW,Zaley Talley M 03/27/2015, 11:08 AM   Pager [phone] If no answer or after 5 PM call [phone]

## 2015-03-27 NOTE — Progress Notes (Addendum)
[facility] PROGRESS NOTE    Progress Note   Julie MerlMichelle L Baltzell WUJ:[id number] DOB: 12/20/1970 DOA: 03/22/2015 PCP: Lora PaulaFUNCHES, JOSALYN C, MD   Brief Narrative:   Julie Rowland is an 44 y.o. female with past medical history of Nash/alcoholic cirrhosis with portal hypertension and esophageal varices recently discharged from the hospital on 03/05/2015 for abdominal pain and suspected his BP underwent EGD that showed esophageal ulceration and hepatic encephalopathy. At home developed progressive somnolence and jaundice came back to the ED on 12/5 2016. As per family having 3 bowel movements a day no reports of fevers. On admission his creatinine was 7 (on discharge it was 2.6)  Assessment/Plan:   Acute Encephalopathy, hepatic (HCC) due Hepatic cirrhosis and AKI: Most likely due to uremia and hepatic encephalopathy now improved. Resume lactulose at a higher dose cont Rifaxmin.  Acute on chronic kidney disease On admission creatinine was 7.0 On last admission Cr was 2.6. Likely prerenal in etiology.  He continues to improve slowly, close to baseline Her MELD is 12.  Alcoholic cirrhosis/Portal hypertension (HCC), given by portal hypertension, esophageal varices: Continue oral Protonix. Appreciate GIs assistance. Hemachromatosis labs send. Will need to follow up with GI as an outpatient.  Metabolic acidosis: Likely due to tissue perfusion and liver disease. Resolved.  Hypercalcemia: Likely due to dehydration resolved with IV hydration.  Anemia, chronic disease andThrombocytopenia (HCC) Hemoglobin seems to be stable as well as thrombocytopenia. Likely due to liver disease and possibly a previous recent episode of bleeding.  Seizure: - was give ativan 2 mg stat, with resolution, following command after episodes. - did not get haldol on 12.11. lytes stable, only on low dose Zoloft. Non focal on physical exam. - ct head CT, EEG and possible LP. Afebrile, no  leukocytosis. - consulted neurology.  DVT Prophylaxis SCD'd  Family Communication: none Disposition Plan: home in 3-4 days Code Status:     Code Status Orders        Start     Ordered   03/22/15 0653  Full code   Continuous     03/22/15 0652        IV Access:    Peripheral IV   Procedures and diagnostic studies:   No results found.   Medical Consultants:    None.  Anti-Infectives:   Anti-infectives    Start     Dose/Rate Route Frequency Ordered Stop   03/26/15 1045  rifaximin (XIFAXAN) tablet 550 mg     550 mg Oral 3 times daily 03/26/15 1038     03/22/15 1900  cefTRIAXone (ROCEPHIN) 2 g in dextrose 5 % 50 mL IVPB  Status:  Discontinued     2 g 100 mL/hr over 30 Minutes Intravenous Every 24 hours 03/22/15 1736 03/26/15 1044      Subjective:    Julie MerlMichelle L Rowland she really was going home.  Objective:    Filed Vitals:   03/26/15 1426 03/26/15 2207 03/27/15 0512 03/27/15 0521  BP: 143/70 154/86 146/59   Pulse: 70 89 83   Temp: 98 F (36.7 C) 98.7 F (37.1 C) 98.5 F (36.9 C)   TempSrc: Axillary Oral Oral   Resp: 18 18 18    Height:      Weight:    91.6 kg (201 lb 15.1 oz)  SpO2: 100% 100% 100%     Intake/Output Summary (Last 24 hours) at 03/27/15 1125 Last data filed at 03/27/15 1041  Gross per 24 hour  Intake     50 ml  Output   1450 ml  Net  -1400 ml   Filed Weights   03/24/15 1647 03/25/15 0500 03/27/15 0521  Weight: 88.905 kg (196 lb) 91.4 kg (201 lb 8 oz) 91.6 kg (201 lb 15.1 oz)    Exam: Gen:  NAD, awake alert and oriented 3. Able to Carry on a conversation. Cardiovascular:  RRR. Chest and lungs:   CTAB Abdomen:  Abdomen soft, nondistended. Extremities:  No C/E/C Neuro exam: Nonfocal, positive asterixis   Data Reviewed:    Labs: Basic Metabolic Panel:  Recent Labs Lab 03/22/15 1205  03/23/15 0236 03/24/15 0426 03/25/15 0528 03/26/15 0414 03/27/15 0431  NA  --   < > 139 141 141 142 145  K  --   < >  3.0* 3.3* 3.3* 4.2 4.0  CL  --   < > 100* 103 105 110 115*  CO2  --   < > 19* 20*  GLUCOSE  --   < > 136* 92 92 89 96  BUN  --   < > 56* 49* 35* 32* 33*  CREATININE  --   < > 5.62* 4.86* 4.27* 3.72* 3.31*  CALCIUM  --   < > 10.2 10.0 9.6 9.6 9.9  MG 2.1  --   --   --   --   --   --   PHOS  --   --   --  3.3 2.9  --   --   < > = values in this interval not displayed. GFR Estimated Creatinine Clearance: 26.2 mL/min (by C-G formula based on Cr of 3.31). Liver Function Tests:  Recent Labs Lab 03/22/15 0008 03/22/15 0905 03/23/15 0236 03/24/15 0426 03/25/15 0528 03/26/15 0414 03/27/15 0431  AST 43* 39 42*  --   --  46* 37  ALT --   --  26 23  ALKPHOS 54 46 48  --   --  41 42  BILITOT 5.3* 4.7* 4.6*  --   --  4.2* 3.9*  PROT 9.4* 8.3* 8.4*  --   --  7.6 7.3  ALBUMIN 3.6 3.2* 3.1* 2.8* 2.8* 2.8* 2.7*   No results for input(s): LIPASE, AMYLASE in the last 168 hours.  Recent Labs Lab 03/22/15 0009 03/23/15 0236 03/25/15 1050  AMMONIA 214* 132* 33   Coagulation profile  Recent Labs Lab 03/22/15 0126 03/23/15 0236 03/26/15 0414 03/27/15 0431  INR 1.51* 1.68* 1.91* 1.92*    CBC:  Recent Labs Lab 03/22/15 0008 03/22/15 0905 03/23/15 0236 03/26/15 0414  WBC 7.0 5.9 8.2 7.8  NEUTROABS  --   --   --  5.0  HGB 10.6* 9.5* 9.7* 8.7*  HCT 31.0* 27.0* 28.4* 25.8*  MCV 95.4 94.1 95.9 98.5  PLT 94* 76* 87* 77*   Cardiac Enzymes: No results for input(s): CKTOTAL, CKMB, CKMBINDEX, TROPONINI in the last 168 hours. BNP (last 3 results) No results for input(s): PROBNP in the last 8760 hours. CBG:  Recent Labs Lab 03/21/15 2338 03/22/15 1323  GLUCAP 108* 109*   D-Dimer: No results for input(s): DDIMER in the last 72 hours. Hgb A1c: No results for input(s): HGBA1C in the last 72 hours. Lipid Profile: No results for input(s): CHOL, HDL, LDLCALC, TRIG, CHOLHDL, LDLDIRECT in the last 72 hours. Thyroid function studies: No results for input(s):  TSH, T4TOTAL, T3FREE, THYROIDAB in the last 72 hours.  Invalid input(s): FREET3 Anemia work up:  Recent Labs  03/26/15 1538  FERRITIN 504*  TIBC 220*  IRON 113   Sepsis Labs:  Recent Labs Lab 03/22/15 0008 03/22/15 0324 03/22/15 0711 03/22/15 0905 03/22/15 1645 03/23/15 0236 03/26/15 0414  WBC 7.0  --   --  5.9  --  8.2 7.8  LATICACIDVEN  --  2.1* 2.9*  --  2.7*  --   --    Microbiology Recent Results (from the past 240 hour(s))  MRSA PCR Screening     Status: None   Collection Time: 03/22/15  1:20 PM  Result Value Ref Range Status   MRSA by PCR NEGATIVE NEGATIVE Final    Comment:        The GeneXpert MRSA Assay (FDA approved for NASAL specimens only), is one component of a comprehensive MRSA colonization surveillance program. It is not intended to diagnose MRSA infection nor to guide or monitor treatment for MRSA infections.      Medications:   . lactulose  30 g Oral TID  . lactulose  300 mL Rectal BID  . pantoprazole  40 mg Oral BID  . phytonadione  5 mg Oral Daily  . rifaximin  550 mg Oral TID  . sertraline  25 mg Oral Daily   Continuous Infusions: . sodium chloride 10 mL/hr at 03/26/15 1050    Time spent: 15 min   LOS: 5 days   Marinda Elk  [facility] Pager [phone]  *Please refer to [email or web address], password [email or web address] to get updated schedule on who will round on this patient, as hospitalists switch teams weekly. If 7PM-7AM, please contact night-coverage at [email or web address], password [email or web address] for any overnight needs.  03/27/2015, 11:25 AM

## 2015-03-27 NOTE — Progress Notes (Signed)
In room discussing status of patient with her mother when she had sudden decerebrate posturing followed by tonic clonic movements, incontinence, and emesis lasting approximately 3 minutes.  Airway maintained.  Mother was present.  Dr. David StallFeliz Ortiz called and Ct ordered stat.  Pt. Given 2 mg. Ativan IV as ordered by Dr. Vanessa BarbaraZamora, who was first present on scene.

## 2015-03-27 NOTE — Consult Note (Signed)
Neurology Consultation Reason for Consult: Seizures Referring Physician: Feliz-ortiz, A  CC: Seizure  History is obtained from: Patient, chart  HPI: Julie Rowland is a 44 y.o. female with a history of 2 previous seizures in 2009 and 2014. She states that no clear precipitating cause was found for either of these and she did not drink at the time of either seizure. She denies any benzodiazepine use or current drinking.  She was seen to have an approximately 3 minute long seizure. She since has had greatly improved mental status, but continues to be mildly encephalopathic (she was admitted for hepatic encephalopathy.)  She also received Ativan.  ROS: A 14 point ROS was performed and is negative except as noted in the HPI.   Past Medical History  Diagnosis Date  . Migraine headache     At age 44  . Hypertension Dx 2003  . Anxiety Dx 2002  . Seizures (HCC) 2014 and 2009  . Portal hypertension (HCC)   . Ascites 10/2014.    Small volume, not enough to tap  . Hepatic encephalopathy (HCC) 02/2015.     Family History  Problem Relation Age of Onset  . Hypertension Mother   . Cancer Mother   . Diabetes Mother   . Colon cancer Neg Hx   . Liver disease Neg Hx      Social History:  reports that she quit smoking about 7 months ago. Her smoking use included Cigarettes. She quit after 20 years of use. She has never used smokeless tobacco. She reports that she drinks alcohol. She reports that she does not use illicit drugs.   Exam: Current vital signs: BP 126/49 mmHg  Pulse 96  Temp(Src) 98.6 F (37 C) (Oral)  Resp 18  Ht  (1.753 m)  Wt 91.6 kg (201 lb 15.1 oz)  BMI 29.81 kg/m2  SpO2 96% Vital signs in last 24 hours: Temp:  [98.5 F (36.9 C)-98.7 F (37.1 C)] 98.6 F (37 C) (12/11 1535) Pulse Rate:  [83-96] 96 (12/11 1535) Resp:  [18] 18 (12/11 0512) BP: (126-154)/(49-86) 126/49 mmHg (12/11 1535) SpO2:  [96 %-100 %] 96 % (12/11 1535) Weight:  [91.6 kg (201 lb  15.1 oz)] 91.6 kg (201 lb 15.1 oz) (12/11 0521)  Physical Exam  Constitutional: Appears yellow Psych: Affect appropriate to situation Eyes: No scleral injection HENT: No OP obstrucion Head: Normocephalic.  Cardiovascular: Normal rate and regular rhythm.  Respiratory: Effort normal and breath sounds normal to anterior ascultation GI: Soft.  No distension. There is no tenderness.  Skin: WDI  Neuro: Mental Status: Patient is awake, alert, oriented to person, place, month, but is unable to give year She has mild tangential thoughts. Cranial Nerves: II: Visual Fields are full. Pupils are equal, round, and reactive to light.   III,IV, VI: EOMI without ptosis or diploplia.  V: Facial sensation is symmetric to temperature VII: Facial movement is symmetric.  VIII: hearing is intact to voice X: Uvula elevates symmetrically XI: Shoulder shrug is symmetric. XII: tongue is midline without atrophy or fasciculations.  Motor: Tone is normal. Bulk is normal. 5/5 strength was present in all four extremities.  Sensory: Sensation is symmetric to light touch and temperature in the arms and legs. Cerebellar: FNF intact bilaterally         I have reviewed labs in epic and the results pertinent to this consultation are: [email or web address]  I have reviewed the images obtained: CT head-negative  Impression: 44 year old female with 3 unprovoked seizures in  her life. She does have some renal dysfunction, but given that she also has significant hepatic dysfunction, would favor choosing Keppra as this can be renally dosed.  Recommendations: 1) Keppra 250 twice a day 2) MRI/EEG 3) once patient is less encephalopathic she should be informed that she is not allowed to drive.   Ritta SlotMcNeill Emberlin Verner, MD [facility] [phone]  If 7pm- 7am, please page neurology on call as listed in AMION.

## 2015-03-28 ENCOUNTER — Inpatient Hospital Stay (HOSPITAL_COMMUNITY): Payer: Medicaid Other

## 2015-03-28 DIAGNOSIS — G9340 Encephalopathy, unspecified: Secondary | ICD-10-CM

## 2015-03-28 LAB — COMPREHENSIVE METABOLIC PANEL
ALBUMIN: 2.8 g/dL — AB (ref 3.5–5.0)
ALK PHOS: 45 U/L (ref 38–126)
ALT: 27 U/L (ref 14–54)
AST: 57 U/L — AB (ref 15–41)
Anion gap: 9 (ref 5–15)
BUN: 25 mg/dL — ABNORMAL HIGH (ref 6–20)
CALCIUM: 9.5 mg/dL (ref 8.9–10.3)
CHLORIDE: 114 mmol/L — AB (ref 101–111)
CO2: 19 mmol/L — AB (ref 22–32)
CREATININE: 2.94 mg/dL — AB (ref 0.44–1.00)
GFR calc Af Amer: 21 mL/min — ABNORMAL LOW (ref >60)
GFR calc non Af Amer: 18 mL/min — ABNORMAL LOW (ref >60)
GLUCOSE: 155 mg/dL — AB (ref 65–99)
Potassium: 3.1 mmol/L — ABNORMAL LOW (ref 3.5–5.1)
SODIUM: 142 mmol/L (ref 135–145)
Total Bilirubin: 3.7 mg/dL — ABNORMAL HIGH (ref 0.3–1.2)
Total Protein: 7.9 g/dL (ref 6.5–8.1)

## 2015-03-28 LAB — BASIC METABOLIC PANEL
ANION GAP: 12 (ref 5–15)
BUN: 28 mg/dL — ABNORMAL HIGH (ref 6–20)
CHLORIDE: 113 mmol/L — AB (ref 101–111)
CO2: 18 mmol/L — AB (ref 22–32)
CREATININE: 2.93 mg/dL — AB (ref 0.44–1.00)
Calcium: 9.6 mg/dL (ref 8.9–10.3)
GFR calc non Af Amer: 18 mL/min — ABNORMAL LOW (ref >60)
GFR, EST AFRICAN AMERICAN: 21 mL/min — AB (ref >60)
Glucose, Bld: 95 mg/dL (ref 65–99)
POTASSIUM: 3.3 mmol/L — AB (ref 3.5–5.1)
Sodium: 143 mmol/L (ref 135–145)

## 2015-03-28 LAB — MITOCHONDRIAL ANTIBODIES: MITOCHONDRIAL M2 AB, IGG: 6 U (ref 0.0–20.0)

## 2015-03-28 LAB — ANTI-SMOOTH MUSCLE ANTIBODY, IGG: F-ACTIN AB IGG: 31 U — AB (ref 0–19)

## 2015-03-28 LAB — PHOSPHORUS: PHOSPHORUS: 2.8 mg/dL (ref 2.5–4.6)

## 2015-03-28 LAB — ANTINUCLEAR ANTIBODIES, IFA: ANTINUCLEAR ANTIBODIES, IFA: NEGATIVE

## 2015-03-28 MED ORDER — SODIUM CHLORIDE 0.45 % IV SOLN
INTRAVENOUS | Status: DC
  Administered 2015-03-28: 12:00:00 via INTRAVENOUS

## 2015-03-28 MED ORDER — POTASSIUM CHLORIDE CRYS ER 20 MEQ PO TBCR
40.0000 meq | EXTENDED_RELEASE_TABLET | Freq: Two times a day (BID) | ORAL | Status: AC
  Administered 2015-03-28 (×2): 40 meq via ORAL
  Filled 2015-03-28 (×2): qty 2

## 2015-03-28 MED ORDER — LEVETIRACETAM 250 MG PO TABS
250.0000 mg | ORAL_TABLET | Freq: Two times a day (BID) | ORAL | Status: DC
  Administered 2015-03-28 – 2015-03-30 (×4): 250 mg via ORAL
  Filled 2015-03-28 (×4): qty 1

## 2015-03-28 NOTE — Progress Notes (Signed)
EEG completed; results pending.    

## 2015-03-28 NOTE — Progress Notes (Signed)
[facility] Progress Note  Subjective: Much more alert and oriented today, tolerating diet  Objective: Vital signs in last 24 hours: Temp:  [98.2 F (36.8 C)-98.6 F (37 C)] 98.2 F (36.8 C) (12/12 0655) Pulse Rate:  [84-96] 86 (12/12 0655) Resp:  [16-18] 16 (12/12 0655) BP: (126-129)/(49-56) 128/54 mmHg (12/12 0655) SpO2:  [96 %-100 %] 100 % (12/12 0655) Weight:  [91.6 kg (201 lb 15.1 oz)] 91.6 kg (201 lb 15.1 oz) (12/12 0655) Weight change: 0 kg (0 lb)   PE: Alert oriented and appropriate abdomen nontender  Lab Results: Results for orders placed or performed during the hospital encounter of 03/22/15 (from the past 24 hour(s))  Ammonia     Status: Abnormal   Collection Time: 03/27/15  3:05 PM  Result Value Ref Range   Ammonia 66 (H) 9 - 35 umol/L  Basic metabolic panel     Status: Abnormal   Collection Time: 03/28/15  6:50 AM  Result Value Ref Range   Sodium 143 135 - 145 mmol/L   Potassium 3.3 (L) 3.5 - 5.1 mmol/L   Chloride 113 (H) 101 - 111 mmol/L   CO2 18 (L) 22 - 32 mmol/L   Glucose, Bld 95 65 - 99 mg/dL   BUN 28 (H) 6 - 20 mg/dL   Creatinine, Ser 1.612.93 (H) 0.44 - 1.00 mg/dL   Calcium 9.6 8.9 - 09.610.3 mg/dL   GFR calc non Af Amer 18 (L) >60 mL/min   GFR calc Af Amer 21 (L) >60 mL/min   Anion gap 12 5 - 15    Studies/Results: Ct Head Wo Contrast  03/27/2015  CLINICAL DATA:  Seizure activity with vomiting EXAM: CT HEAD WITHOUT CONTRAST TECHNIQUE: Contiguous axial images were obtained from the base of the skull through the vertex without intravenous contrast. COMPARISON:  08/31/2007 FINDINGS: The bony calvarium is intact. The ventricles are of normal size and configuration. No findings to suggest acute hemorrhage, acute infarction or space-occupying mass lesion are noted. IMPRESSION: No acute abnormality noted. Electronically Signed   By: Alcide CleverMark  Lukens M.D.   On: 03/27/2015 14:51   Mr Brain Wo Contrast  03/27/2015  CLINICAL DATA:  New onset seizure. The  patient has had 2 previous seizures in her life. EXAM: MRI HEAD WITHOUT CONTRAST TECHNIQUE: Multiplanar, multiecho pulse sequences of the brain and surrounding structures were obtained without intravenous contrast. COMPARISON:  CT head from the same day. FINDINGS: The diffusion-weighted images demonstrate no acute or subacute infarction. The study is mildly degraded by patient motion. Mild periventricular T2 changes are present. No other significant white matter disease is noted. The internal auditory canals are within normal limits. The skullbase and brainstem are unremarkable. Dedicated imaging of the temporal lobes demonstrates symmetric size and signal of the hippocampal structures. Flow is present in the major intracranial arteries. The globes and orbits are intact. The paranasal sinuses and mastoid air cells are clear. IMPRESSION: 1. No acute or focal lesion to explain the patient's seizures. 2. Mild periventricular T2 changes are slightly greater than expected for age. The finding is nonspecific but can be seen in the setting of chronic microvascular ischemia, a demyelinating process such as multiple sclerosis, vasculitis, complicated migraine headaches, or as the sequelae of a prior infectious or inflammatory process. Electronically Signed   By: Marin Robertshristopher  Mattern M.D.   On: 03/27/2015 20:04      Assessment: 1. Cirrhosis, etiology unclear, creatinine slightly improved, latest INR still rising 2. Acute renal failure improving 3. Encephalopathy improving 4.  Coagulopathy stable  Plan: 1. Repeat INR tomorrow 2. Await hemachromatosis genetic markers, autoimmune markers 3. Continue lactulose 4. Consider initiation of referral for eventual transplant probably as outpatient    Berdena Cisek C 03/28/2015, 11:37 AM  Pager [phone] If no answer or after 5 PM call [phone]

## 2015-03-28 NOTE — Procedures (Signed)
History: 44 yo F with seizures  Sedation: none  Technique: This is a 21 channel routine scalp EEG performed at the bedside with bipolar and monopolar montages arranged in accordance to the international 10/20 system of electrode placement. One channel was dedicated to EKG recording.    Background: The background consists of intermixed alpha and beta activities. There is a well defined posterior dominant rhythm of 8.5 Hz that attenuates with eye opening. Sleep is recorded with normal appearing structures.   Photic stimulation: Physiologic driving is performed  EEG Abnormalities: None  Clinical Interpretation: This normal EEG is recorded in the waking and sleep state. There was no seizure or seizure predisposition recorded on this study. Please note that a normal EEG does not rule out a diagnosis of epilepsy.   Julie SlotMcNeill Ondre Salvetti, MD [facility] [phone]  If 7pm- 7am, please page neurology on call as listed in AMION.

## 2015-03-28 NOTE — Progress Notes (Signed)
[facility] PROGRESS NOTE    Progress Note   Julie Rowland:[id number] DOB: 05-08-1970 DOA: 03/22/2015 PCP: Lora Paula, MD   Brief Narrative:   Julie Rowland is an 44 y.o. female with past medical history of [place]/alcoholic cirrhosis with portal hypertension and esophageal varices recently discharged from the hospital on 03/05/2015 for abdominal pain and suspected his BP underwent EGD that showed esophageal ulceration and hepatic encephalopathy. At home developed progressive somnolence and jaundice came back to the ED on 12/5 2016. As per family having 3 bowel movements a day no reports of fevers. On admission his creatinine was 7 (on discharge it was 2.6 on 11.9.2016)  Assessment/Plan:   Acute Encephalopathy, hepatic (HCC) due Hepatic cirrhosis and AKI: Most likely due to uremia and hepatic encephalopathy now resolved. Reciving her lactulose and Rifaxmin.  Acute on chronic kidney disease On admission creatinine was 7.0 On last admission Cr was 2.6. Likely prerenal in etiology. Cont to hold diuretics. Cr. continues to improve slowly, today is 2.9  Alcoholic cirrhosis/Portal hypertension (HCC), given by portal hypertension, esophageal varices: Continue oral Protonix. Appreciate GIs assistance. Will need to follow up with GI as an outpatient. Will be referred as an outpatient by GI to a transplant center.  Metabolic acidosis: Likely due to tissue perfusion and liver disease. Resolved.  Hypercalcemia: Likely due to dehydration resolved with IV hydration.  Anemia, chronic disease andThrombocytopenia (HCC) Hemoglobin seems to be stable as well as thrombocytopenia.   DVT Prophylaxis SCD'd  Family Communication: none Disposition Plan: home in 2 days Code Status:     Code Status Orders        Start     Ordered   03/22/15 0653  Full code   Continuous     03/22/15 0652        IV Access:    Peripheral IV   Procedures and diagnostic  studies:   Ct Head Wo Contrast  03/27/2015  CLINICAL DATA:  Seizure activity with vomiting EXAM: CT HEAD WITHOUT CONTRAST TECHNIQUE: Contiguous axial images were obtained from the base of the skull through the vertex without intravenous contrast. COMPARISON:  08/31/2007 FINDINGS: The bony calvarium is intact. The ventricles are of normal size and configuration. No findings to suggest acute hemorrhage, acute infarction or space-occupying mass lesion are noted. IMPRESSION: No acute abnormality noted. Electronically Signed   By: Alcide Clever M.D.   On: 03/27/2015 14:51   Mr Brain Wo Contrast  03/27/2015  CLINICAL DATA:  New onset seizure. The patient has had 2 previous seizures in her life. EXAM: MRI HEAD WITHOUT CONTRAST TECHNIQUE: Multiplanar, multiecho pulse sequences of the brain and surrounding structures were obtained without intravenous contrast. COMPARISON:  CT head from the same day. FINDINGS: The diffusion-weighted images demonstrate no acute or subacute infarction. The study is mildly degraded by patient motion. Mild periventricular T2 changes are present. No other significant white matter disease is noted. The internal auditory canals are within normal limits. The skullbase and brainstem are unremarkable. Dedicated imaging of the temporal lobes demonstrates symmetric size and signal of the hippocampal structures. Flow is present in the major intracranial arteries. The globes and orbits are intact. The paranasal sinuses and mastoid air cells are clear. IMPRESSION: 1. No acute or focal lesion to explain the patient's seizures. 2. Mild periventricular T2 changes are slightly greater than expected for age. The finding is nonspecific but can be seen in the setting of chronic microvascular ischemia, a demyelinating process such as multiple sclerosis, vasculitis,  complicated migraine headaches, or as the sequelae of a prior infectious or inflammatory process. Electronically Signed   By: Marin Roberts M.D.   On: 03/27/2015 20:04     Medical Consultants:    None.  Anti-Infectives:   Anti-infectives    Start     Dose/Rate Route Frequency Ordered Stop   03/26/15 1045  rifaximin (XIFAXAN) tablet 550 mg     550 mg Oral 3 times daily 03/26/15 1038     03/22/15 1900  cefTRIAXone (ROCEPHIN) 2 g in dextrose 5 % 50 mL IVPB  Status:  Discontinued     2 g 100 mL/hr over 30 Minutes Intravenous Every 24 hours 03/22/15 1736 03/26/15 1044      Subjective:    Julie Rowland no complains, taking her lactulose.  Objective:    Filed Vitals:   03/27/15 0521 03/27/15 1535 03/27/15 2153 03/28/15 0655  BP:  126/49 129/56 128/54  Pulse:  96 84 86  Temp:  98.6 F (37 C) 98.2 F (36.8 C) 98.2 F (36.8 C)  TempSrc:  Oral Oral Oral  Resp:   18 16  Height:      Weight: 91.6 kg (201 lb 15.1 oz)   91.6 kg (201 lb 15.1 oz)  SpO2:  96% 100% 100%    Intake/Output Summary (Last 24 hours) at 03/28/15 1230 Last data filed at 03/28/15 1206  Gross per 24 hour  Intake 1217.75 ml  Output   1275 ml  Net -57.25 ml   Filed Weights   03/25/15 0500 03/27/15 0521 03/28/15 0655  Weight: 91.4 kg (201 lb 8 oz) 91.6 kg (201 lb 15.1 oz) 91.6 kg (201 lb 15.1 oz)    Exam: Gen:  NAD, pleasantly confused today, and with some paranoia Cardiovascular:  RRR. Chest and lungs:   CTAB Abdomen:  Abdomen soft, nondistended. Extremities:  No C/E/C Neuro exam: Nonfocal, negative asterixis   Data Reviewed:    Labs: Basic Metabolic Panel:  Recent Labs Lab 03/22/15 1205  03/24/15 0426 03/25/15 0528 03/26/15 0414 03/27/15 0431 03/28/15 0650  NA  --   < > 141 141 142 145 143  K  --   < > 3.3* 3.3* 4.2 4.0 3.3*  CL  --   < > 103 105 110 115* 113*  CO2  --   < > 25 24 19* 20* 18*  GLUCOSE  --   < > 92 92 89 96 95  BUN  --   < > 49* 35* 32* 33* 28*  CREATININE  --   < > 4.86* 4.27* 3.72* 3.31* 2.93*  CALCIUM  --   < > 10.0 9.6 9.6 9.9 9.6  MG 2.1  --   --   --   --   --   --   PHOS  --    --  3.3 2.9  --   --   --   < > = values in this interval not displayed. GFR Estimated Creatinine Clearance: 29.6 mL/min (by C-G formula based on Cr of 2.93). Liver Function Tests:  Recent Labs Lab 03/22/15 0008 03/22/15 0905 03/23/15 0236 03/24/15 0426 03/25/15 0528 03/26/15 0414 03/27/15 0431  AST 43* 39 42*  --   --  46* 37  ALT 24 23 23   --   --  26 23  ALKPHOS 54 46 48  --   --  41 42  BILITOT 5.3* 4.7* 4.6*  --   --  4.2* 3.9*  PROT 9.4* 8.3*  8.4*  --   --  7.6 7.3  ALBUMIN 3.6 3.2* 3.1* 2.8* 2.8* 2.8* 2.7*   No results for input(s): LIPASE, AMYLASE in the last 168 hours.  Recent Labs Lab 03/22/15 0009 03/23/15 0236 03/25/15 1050 03/27/15 1505  AMMONIA 214* 132* 33 66*   Coagulation profile  Recent Labs Lab 03/22/15 0126 03/23/15 0236 03/26/15 0414 03/27/15 0431  INR 1.51* 1.68* 1.91* 1.92*    CBC:  Recent Labs Lab 03/22/15 0008 03/22/15 0905 03/23/15 0236 03/26/15 0414  WBC 7.0 5.9 8.2 7.8  NEUTROABS  --   --   --  5.0  HGB 10.6* 9.5* 9.7* 8.7*  HCT 31.0* 27.0* 28.4* 25.8*  MCV 95.4 94.1 95.9 98.5  PLT 94* 76* 87* 77*   Cardiac Enzymes: No results for input(s): CKTOTAL, CKMB, CKMBINDEX, TROPONINI in the last 168 hours. BNP (last 3 results) No results for input(s): PROBNP in the last 8760 hours. CBG:  Recent Labs Lab 03/21/15 2338 03/22/15 1323  GLUCAP 108* 109*   D-Dimer: No results for input(s): DDIMER in the last 72 hours. Hgb A1c: No results for input(s): HGBA1C in the last 72 hours. Lipid Profile: No results for input(s): CHOL, HDL, LDLCALC, TRIG, CHOLHDL, LDLDIRECT in the last 72 hours. Thyroid function studies: No results for input(s): TSH, T4TOTAL, T3FREE, THYROIDAB in the last 72 hours.  Invalid input(s): FREET3 Anemia work up:  Recent Labs  03/26/15 1538  FERRITIN 504*  TIBC 220*  IRON 113   Sepsis Labs:  Recent Labs Lab 03/22/15 0008 03/22/15 0324 03/22/15 0711 03/22/15 0905 03/22/15 1645  03/23/15 0236 03/26/15 0414  WBC 7.0  --   --  5.9  --  8.2 7.8  LATICACIDVEN  --  2.1* 2.9*  --  2.7*  --   --    Microbiology Recent Results (from the past 240 hour(s))  MRSA PCR Screening     Status: None   Collection Time: 03/22/15  1:20 PM  Result Value Ref Range Status   MRSA by PCR NEGATIVE NEGATIVE Final    Comment:        The GeneXpert MRSA Assay (FDA approved for NASAL specimens only), is one component of a comprehensive MRSA colonization surveillance program. It is not intended to diagnose MRSA infection nor to guide or monitor treatment for MRSA infections.      Medications:   . lactulose  30 g Oral TID  . lactulose  300 mL Rectal BID  . levETIRAcetam  250 mg Intravenous Q12H  . pantoprazole  40 mg Oral BID  . phytonadione  5 mg Oral Daily  . rifaximin  550 mg Oral TID  . sertraline  25 mg Oral Daily   Continuous Infusions: . sodium chloride 50 mL/hr at 03/28/15 1132  . sodium chloride 10 mL/hr at 03/26/15 1050    Time spent: 15 min   LOS: 6 days   Marinda Elk  [facility] Pager [phone]  *Please refer to [email or web address], password [email or web address] to get updated schedule on who will round on this patient, as hospitalists switch teams weekly. If 7PM-7AM, please contact night-coverage at [email or web address], password [email or web address] for any overnight needs.  03/28/2015, 12:30 PM

## 2015-03-28 NOTE — Progress Notes (Signed)
Subjective: No complaints. Now alert and states she does not plan to drive for 6 months.   Objective: Current vital signs: BP 128/54 mmHg  Pulse 86  Temp(Src) 98.2 F (36.8 C) (Oral)  Resp 16  Ht  (1.753 m)  Wt 91.6 kg (201 lb 15.1 oz)  BMI 29.81 kg/m2  SpO2 100% Vital signs in last 24 hours: Temp:  [98.2 F (36.8 C)-98.6 F (37 C)] 98.2 F (36.8 C) (12/12 0655) Pulse Rate:  [84-96] 86 (12/12 0655) Resp:  [16-18] 16 (12/12 0655) BP: (126-129)/(49-56) 128/54 mmHg (12/12 0655) SpO2:  [96 %-100 %] 100 % (12/12 0655) Weight:  [91.6 kg (201 lb 15.1 oz)] 91.6 kg (201 lb 15.1 oz) (12/12 0655)  Intake/Output from previous day: 12/11 0701 - 12/12 0700 In: 797.8 [P.O.:200; I.V.:597.8] Out: 425 [Urine:425] Intake/Output this shift: Total I/O In: -  Out: 850 [Urine:850] Nutritional status: DIET SOFT Room service appropriate?: Yes; Fluid consistency:: Thin  Neurologic Exam: General: NAD Mental Status: Alert, oriented, thought content appropriate.  Speech dysarthric without evidence of aphasia.  Able to follow 3 step commands without difficulty. Cranial Nerves: II: Discs flat bilaterally; Visual fields grossly normal, pupils equal, round, reactive to light and accommodation III,IV, VI: ptosis not present, extra-ocular motions intact bilaterally V,VII: smile symmetric, facial light touch sensation normal bilaterally VIII: hearing normal bilaterally IX,X: uvula rises symmetrically XI: bilateral shoulder shrug XII: midline tongue extension without atrophy or fasciculations  Motor: Right : Upper extremity   5/5    Left:     Upper extremity   5/5  Lower extremity   5/5     Lower extremity   5/5 Tone and bulk:normal tone throughout; no atrophy noted Sensory: Pinprick and light touch intact throughout, bilaterally Deep Tendon Reflexes:  Right: Upper Extremity   Left: Upper extremity   biceps (C-5 to C-6) 2/4   biceps (C-5 to C-6) 2/4 tricep (C7) 2/4    triceps (C7)  2/4 Brachioradialis (C6) 2/4  Brachioradialis (C6) 2/4  Lower Extremity Lower Extremity  quadriceps (L-2 to L-4) 2/4   quadriceps (L-2 to L-4) 2/4 Achilles (S1) 2/4   Achilles (S1) 2/4  Plantars: Right: downgoing   Left: downgoing Cerebellar: normal finger-to-nose,     Lab Results: Basic Metabolic Panel:  Recent Labs Lab 03/22/15 1205  03/24/15 0426 03/25/15 0528 03/26/15 0414 03/27/15 0431 03/28/15 0650  NA  --   < > 141 141 142 145 143  K  --   < > 3.3* 3.3* 4.2 4.0 3.3*  CL  --   < > 103 105 110 115* 113*  CO2  --   < > 25 24 19* 20* 18*  GLUCOSE  --   < > 92 92 89 96 95  BUN  --   < > 49* 35* 32* 33* 28*  CREATININE  --   < > 4.86* 4.27* 3.72* 3.31* 2.93*  CALCIUM  --   < > 10.0 9.6 9.6 9.9 9.6  MG 2.1  --   --   --   --   --   --   PHOS  --   --  3.3 2.9  --   --   --   < > = values in this interval not displayed.  Liver Function Tests:  Recent Labs Lab 03/22/15 0008 03/22/15 0905 03/23/15 0236 03/24/15 0426 03/25/15 0528 03/26/15 0414 03/27/15 0431  AST 43* 39 42*  --   --  46* 37  ALT --   --  26 23  ALKPHOS 54 46 48  --   --  41 42  BILITOT 5.3* 4.7* 4.6*  --   --  4.2* 3.9*  PROT 9.4* 8.3* 8.4*  --   --  7.6 7.3  ALBUMIN 3.6 3.2* 3.1* 2.8* 2.8* 2.8* 2.7*   No results for input(s): LIPASE, AMYLASE in the last 168 hours.  Recent Labs Lab 03/23/15 0236 03/25/15 1050 03/27/15 1505  AMMONIA 132* 33 66*    CBC:  Recent Labs Lab 03/22/15 0008 03/22/15 0905 03/23/15 0236 03/26/15 0414  WBC 7.0 5.9 8.2 7.8  NEUTROABS  --   --   --  5.0  HGB 10.6* 9.5* 9.7* 8.7*  HCT 31.0* 27.0* 28.4* 25.8*  MCV 95.4 94.1 95.9 98.5  PLT 94* 76* 87* 77*    Cardiac Enzymes: No results for input(s): CKTOTAL, CKMB, CKMBINDEX, TROPONINI in the last 168 hours.  Lipid Panel: No results for input(s): CHOL, TRIG, HDL, CHOLHDL, VLDL, LDLCALC in the last 168 hours.  CBG:  Recent Labs Lab 03/21/15 2338 03/22/15 1323  GLUCAP 108* 109*     Microbiology: Results for orders placed or performed during the hospital encounter of 03/22/15  MRSA PCR Screening     Status: None   Collection Time: 03/22/15  1:20 PM  Result Value Ref Range Status   MRSA by PCR NEGATIVE NEGATIVE Final    Comment:        The GeneXpert MRSA Assay (FDA approved for NASAL specimens only), is one component of a comprehensive MRSA colonization surveillance program. It is not intended to diagnose MRSA infection nor to guide or monitor treatment for MRSA infections.     Coagulation Studies:  Recent Labs  03/26/15 0414 03/27/15 0431  LABPROT 21.8* 21.9*  INR 1.91* 1.92*    Imaging: Ct Head Wo Contrast  03/27/2015  CLINICAL DATA:  Seizure activity with vomiting EXAM: CT HEAD WITHOUT CONTRAST TECHNIQUE: Contiguous axial images were obtained from the base of the skull through the vertex without intravenous contrast. COMPARISON:  08/31/2007 FINDINGS: The bony calvarium is intact. The ventricles are of normal size and configuration. No findings to suggest acute hemorrhage, acute infarction or space-occupying mass lesion are noted. IMPRESSION: No acute abnormality noted. Electronically Signed   By: Alcide CleverMark  Lukens M.D.   On: 03/27/2015 14:51   Mr Brain Wo Contrast  03/27/2015  CLINICAL DATA:  New onset seizure. The patient has had 2 previous seizures in her life. EXAM: MRI HEAD WITHOUT CONTRAST TECHNIQUE: Multiplanar, multiecho pulse sequences of the brain and surrounding structures were obtained without intravenous contrast. COMPARISON:  CT head from the same day. FINDINGS: The diffusion-weighted images demonstrate no acute or subacute infarction. The study is mildly degraded by patient motion. Mild periventricular T2 changes are present. No other significant white matter disease is noted. The internal auditory canals are within normal limits. The skullbase and brainstem are unremarkable. Dedicated imaging of the temporal lobes demonstrates symmetric size  and signal of the hippocampal structures. Flow is present in the major intracranial arteries. The globes and orbits are intact. The paranasal sinuses and mastoid air cells are clear. IMPRESSION: 1. No acute or focal lesion to explain the patient's seizures. 2. Mild periventricular T2 changes are slightly greater than expected for age. The finding is nonspecific but can be seen in the setting of chronic microvascular ischemia, a demyelinating process such as multiple sclerosis, vasculitis, complicated migraine headaches, or as the sequelae of a prior infectious or inflammatory process. Electronically Signed  By: Marin Roberts M.D.   On: 03/27/2015 20:04    Medications:  Scheduled: . lactulose  30 g Oral TID  . lactulose  300 mL Rectal BID  . levETIRAcetam  250 mg Intravenous Q12H  . pantoprazole  40 mg Oral BID  . phytonadione  5 mg Oral Daily  . rifaximin  550 mg Oral TID  . sertraline  25 mg Oral Daily    Assessment/Plan:  44 YO female with 3 unprovoked seizures.  Currently on renal dose Keppra 250 mg BID with no further seizures over night. Awaiting EEG.  MRI brain shows no acute infarct or etiology for seizure.   Recommend: 1) EEG 2) Continue Keppra 250 mg BID -may change to PO when taking PO medications and on Discharge. 3) Will need follow up as out patient with neurology   Felicie Morn PA-C [facility] [phone]  03/28/2015, 9:47 AM

## 2015-03-29 DIAGNOSIS — R569 Unspecified convulsions: Secondary | ICD-10-CM

## 2015-03-29 LAB — AMMONIA: AMMONIA: 35 umol/L (ref 9–35)

## 2015-03-29 LAB — COMPREHENSIVE METABOLIC PANEL
ALBUMIN: 2.6 g/dL — AB (ref 3.5–5.0)
ALK PHOS: 43 U/L (ref 38–126)
ALT: 27 U/L (ref 14–54)
ANION GAP: 7 (ref 5–15)
AST: 54 U/L — ABNORMAL HIGH (ref 15–41)
BILIRUBIN TOTAL: 3.6 mg/dL — AB (ref 0.3–1.2)
BUN: 22 mg/dL — AB (ref 6–20)
CALCIUM: 9.3 mg/dL (ref 8.9–10.3)
CO2: 18 mmol/L — ABNORMAL LOW (ref 22–32)
Chloride: 114 mmol/L — ABNORMAL HIGH (ref 101–111)
Creatinine, Ser: 2.82 mg/dL — ABNORMAL HIGH (ref 0.44–1.00)
GFR calc Af Amer: 22 mL/min — ABNORMAL LOW (ref >60)
GFR, EST NON AFRICAN AMERICAN: 19 mL/min — AB (ref >60)
GLUCOSE: 123 mg/dL — AB (ref 65–99)
POTASSIUM: 3.6 mmol/L (ref 3.5–5.1)
SODIUM: 139 mmol/L (ref 135–145)
TOTAL PROTEIN: 6.8 g/dL (ref 6.5–8.1)

## 2015-03-29 LAB — PROTIME-INR
INR: 1.86 — AB (ref 0.00–1.49)
PROTHROMBIN TIME: 21.4 s — AB (ref 11.6–15.2)

## 2015-03-29 LAB — MAGNESIUM: Magnesium: 1.6 mg/dL — ABNORMAL LOW (ref 1.7–2.4)

## 2015-03-29 NOTE — Progress Notes (Addendum)
[facility] PROGRESS NOTE    Progress Note   Julie Rowland AOZ:[id number] DOB: 1970-04-23 DOA: 03/22/2015 PCP: Lora Paula, MD   Brief Narrative:   Julie Rowland is an 44 y.o. female with past medical history of Nash/alcoholic cirrhosis with portal hypertension and esophageal varices recently discharged from the hospital on 03/05/2015 for abdominal pain and suspected his BP underwent EGD that showed esophageal ulceration and hepatic encephalopathy. At home developed progressive somnolence and jaundice came back to the ED on 12/5 2016. As per family having 3 bowel movements a day no reports of fevers. On admission his creatinine was 7 (on discharge it was 2.6 on 11.9.2016), started on rifaximin and aggressive IV fluid hydration her renal function has improved significantly and is now close to baseline, her asterixis resolved and her ammonia was 33. She did develop tonic-clonic seizures CT and MRI was done which were nonconclusive. Neurology was consulted who recommended an EEG that shows no signs of seizures, they recommended to start her on Keppra, low dose.  Assessment/Plan:   Acute Encephalopathy, hepatic (HCC) due Hepatic cirrhosis and AKI: Most likely due to uremia and hepatic encephalopathy now resolved. Reciving her lactulose and Rifaxmin. No asterixis. Appreciate GIs assistance.  Acute on chronic kidney disease On admission creatinine was 7.0 On last admission Cr was 2.6. Likely prerenal in etiology. Cont to hold diuretics. Cr. continues to improve slowly, today is 2.8, close to baseline DC IV fluids. Need to restart diuretics as an outpatient at a lower dose.  Alcoholic cirrhosis/Portal hypertension (HCC), given by portal hypertension, esophageal varices: Continue oral Protonix. Appreciate GIs assistance. Will need to follow up with GI as an outpatient. Will be referred as an outpatient by GI to a transplant center.  Metabolic acidosis: Likely due  to tissue perfusion and liver disease. Resolved.  Hypercalcemia: Likely due to dehydration resolved with IV hydration.  Anemia, chronic disease andThrombocytopenia (HCC) Likely due to cirrhosis has remained stable throughout her hospital stay.  Convulsion/seizure disorder: Of unclear etiology. MRI brain, CT of the head and EEG were unremarkable. Neurology was consulted, recommended to continue Keppra and follow-up as an outpatient.  DVT Prophylaxis SCD'd  Family Communication: none Disposition Plan: home in 1 days Code Status:     Code Status Orders        Start     Ordered   03/22/15 0653  Full code   Continuous     03/22/15 0652        IV Access:    Peripheral IV   Procedures and diagnostic studies:   Ct Head Wo Contrast  03/27/2015  CLINICAL DATA:  Seizure activity with vomiting EXAM: CT HEAD WITHOUT CONTRAST TECHNIQUE: Contiguous axial images were obtained from the base of the skull through the vertex without intravenous contrast. COMPARISON:  08/31/2007 FINDINGS: The bony calvarium is intact. The ventricles are of normal size and configuration. No findings to suggest acute hemorrhage, acute infarction or space-occupying mass lesion are noted. IMPRESSION: No acute abnormality noted. Electronically Signed   By: Alcide Clever M.D.   On: 03/27/2015 14:51   Mr Brain Wo Contrast  03/27/2015  CLINICAL DATA:  New onset seizure. The patient has had 2 previous seizures in her life. EXAM: MRI HEAD WITHOUT CONTRAST TECHNIQUE: Multiplanar, multiecho pulse sequences of the brain and surrounding structures were obtained without intravenous contrast. COMPARISON:  CT head from the same day. FINDINGS: The diffusion-weighted images demonstrate no acute or subacute infarction. The study is mildly degraded by patient  motion. Mild periventricular T2 changes are present. No other significant white matter disease is noted. The internal auditory canals are within normal limits. The  skullbase and brainstem are unremarkable. Dedicated imaging of the temporal lobes demonstrates symmetric size and signal of the hippocampal structures. Flow is present in the major intracranial arteries. The globes and orbits are intact. The paranasal sinuses and mastoid air cells are clear. IMPRESSION: 1. No acute or focal lesion to explain the patient's seizures. 2. Mild periventricular T2 changes are slightly greater than expected for age. The finding is nonspecific but can be seen in the setting of chronic microvascular ischemia, a demyelinating process such as multiple sclerosis, vasculitis, complicated migraine headaches, or as the sequelae of a prior infectious or inflammatory process. Electronically Signed   By: Marin Roberts M.D.   On: 03/27/2015 20:04     Medical Consultants:    None.  Anti-Infectives:   Anti-infectives    Start     Dose/Rate Route Frequency Ordered Stop   03/26/15 1045  rifaximin (XIFAXAN) tablet 550 mg     550 mg Oral 3 times daily 03/26/15 1038     03/22/15 1900  cefTRIAXone (ROCEPHIN) 2 g in dextrose 5 % 50 mL IVPB  Status:  Discontinued     2 g 100 mL/hr over 30 Minutes Intravenous Every 24 hours 03/22/15 1736 03/26/15 1044      Subjective:    Julie Rowland no complains, taking her lactulose.  Objective:    Filed Vitals:   03/28/15 0655 03/28/15 1322 03/28/15 2159 03/29/15 0513  BP: 128/54 139/64 161/65 119/70  Pulse: 86 82 84 87  Temp: 98.2 F (36.8 C) 98 F (36.7 C) 97.8 F (36.6 C) 98.3 F (36.8 C)  TempSrc: Oral Oral Oral Oral  Resp: 16 16 18 18   Height:      Weight: 91.6 kg (201 lb 15.1 oz)   91.3 kg (201 lb 4.5 oz)  SpO2: 100% 100% 100% 96%    Intake/Output Summary (Last 24 hours) at 03/29/15 1059 Last data filed at 03/29/15 1040  Gross per 24 hour  Intake 1928.08 ml  Output    300 ml  Net 1628.08 ml   Filed Weights   03/27/15 0521 03/28/15 0655 03/29/15 0513  Weight: 91.6 kg (201 lb 15.1 oz) 91.6 kg (201 lb  15.1 oz) 91.3 kg (201 lb 4.5 oz)    Exam: Gen:  NAD. Cardiovascular:  RRR. Chest and lungs:   CTAB Abdomen:  Abdomen soft, nondistended. Extremities:  No C/E/C Neuro exam: Nonfocal, negative asterixis   Data Reviewed:    Labs: Basic Metabolic Panel:  Recent Labs Lab 03/22/15 1205  03/24/15 0426 03/25/15 0528 03/26/15 0414 03/27/15 0431 03/28/15 0650 03/28/15 1305 03/29/15 0018  NA  --   < > 141 141 142 145 143 142 139  K  --   < > 3.3* 3.3* 4.2 4.0 3.3* 3.1* 3.6  CL  --   < > 103 105 110 115* 113* 114* 114*  CO2  --   < > 25 24 19* 20* 18* 19* 18*  GLUCOSE  --   < > 92 92 89 96 95 155* 123*  BUN  --   < > 49* 35* 32* 33* 28* 25* 22*  CREATININE  --   < > 4.86* 4.27* 3.72* 3.31* 2.93* 2.94* 2.82*  CALCIUM  --   < > 10.0 9.6 9.6 9.9 9.6 9.5 9.3  MG 2.1  --   --   --   --   --   --   --  1.6*  PHOS  --   --  3.3 2.9  --   --   --  2.8  --   < > = values in this interval not displayed. GFR Estimated Creatinine Clearance: 30.6 mL/min (by C-G formula based on Cr of 2.82). Liver Function Tests:  Recent Labs Lab 03/23/15 0236  03/25/15 0528 03/26/15 0414 03/27/15 0431 03/28/15 1305 03/29/15 0018  AST 42*  --   --  46* 37 57* 54*  ALT 23  --   --  ALKPHOS 48  --   --  41 42 45 43  BILITOT 4.6*  --   --  4.2* 3.9* 3.7* 3.6*  PROT 8.4*  --   --  7.6 7.3 7.9 6.8  ALBUMIN 3.1*  < > 2.8* 2.8* 2.7* 2.8* 2.6*  < > = values in this interval not displayed. No results for input(s): LIPASE, AMYLASE in the last 168 hours.  Recent Labs Lab 03/23/15 0236 03/25/15 1050 03/27/15 1505  AMMONIA 132* 33 66*   Coagulation profile  Recent Labs Lab 03/23/15 0236 03/26/15 0414 03/27/15 0431 03/29/15 0018  INR 1.68* 1.91* 1.92* 1.86*    CBC:  Recent Labs Lab 03/23/15 0236 03/26/15 0414  WBC 8.2 7.8  NEUTROABS  --  5.0  HGB 9.7* 8.7*  HCT 28.4* 25.8*  MCV 95.9 98.5  PLT 87* 77*   Cardiac Enzymes: No results for input(s): CKTOTAL, CKMB, CKMBINDEX,  TROPONINI in the last 168 hours. BNP (last 3 results) No results for input(s): PROBNP in the last 8760 hours. CBG:  Recent Labs Lab 03/22/15 1323  GLUCAP 109*   D-Dimer: No results for input(s): DDIMER in the last 72 hours. Hgb A1c: No results for input(s): HGBA1C in the last 72 hours. Lipid Profile: No results for input(s): CHOL, HDL, LDLCALC, TRIG, CHOLHDL, LDLDIRECT in the last 72 hours. Thyroid function studies: No results for input(s): TSH, T4TOTAL, T3FREE, THYROIDAB in the last 72 hours.  Invalid input(s): FREET3 Anemia work up:  Recent Labs  03/26/15 1538  FERRITIN 504*  TIBC 220*  IRON 113   Sepsis Labs:  Recent Labs Lab 03/22/15 1645 03/23/15 0236 03/26/15 0414  WBC  --  8.2 7.8  LATICACIDVEN 2.7*  --   --    Microbiology Recent Results (from the past 240 hour(s))  MRSA PCR Screening     Status: None   Collection Time: 03/22/15  1:20 PM  Result Value Ref Range Status   MRSA by PCR NEGATIVE NEGATIVE Final    Comment:        The GeneXpert MRSA Assay (FDA approved for NASAL specimens only), is one component of a comprehensive MRSA colonization surveillance program. It is not intended to diagnose MRSA infection nor to guide or monitor treatment for MRSA infections.      Medications:   . lactulose  30 g Oral TID  . levETIRAcetam  250 mg Oral BID  . pantoprazole  40 mg Oral BID  . rifaximin  550 mg Oral TID  . sertraline  25 mg Oral Daily   Continuous Infusions: . sodium chloride 50 mL/hr at 03/28/15 1132  . sodium chloride 10 mL/hr at 03/26/15 1050    Time spent: 15 min   LOS: 7 days   Marinda Elk  [facility] Pager [phone]  *Please refer to [email or web address], password [email or web address] to get updated schedule on who will round on this patient, as hospitalists switch teams weekly. If 7PM-7AM, please contact night-coverage  at [email or web address], password [email or web address] for any overnight needs.  03/29/2015, 10:59 AM

## 2015-03-29 NOTE — Progress Notes (Signed)
[facility] Progress Note  Subjective: Feeling much better today, no seizure activity appears alert and oriented, no complaints  Objective: Vital signs in last 24 hours: Temp:  [97.8 F (36.6 C)-98.3 F (36.8 C)] 98.3 F (36.8 C) (12/13 0513) Pulse Rate:  [82-87] 87 (12/13 0513) Resp:  [16-18] 18 (12/13 0513) BP: (119-161)/(64-70) 119/70 mmHg (12/13 0513) SpO2:  [96 %-100 %] 96 % (12/13 0513) Weight:  [91.3 kg (201 lb 4.5 oz)] 91.3 kg (201 lb 4.5 oz) (12/13 0513) Weight change: -0.3 kg (-10.6 oz)   PE: Unchanged  Lab Results: Results for orders placed or performed during the hospital encounter of 03/22/15 (from the past 24 hour(s))  Phosphorus     Status: None   Collection Time: 03/28/15  1:05 PM  Result Value Ref Range   Phosphorus 2.8 2.5 - 4.6 mg/dL  Comprehensive metabolic panel     Status: Abnormal   Collection Time: 03/28/15  1:05 PM  Result Value Ref Range   Sodium 142 135 - 145 mmol/L   Potassium 3.1 (L) 3.5 - 5.1 mmol/L   Chloride 114 (H) 101 - 111 mmol/L   CO2 19 (L) 22 - 32 mmol/L   Glucose, Bld 155 (H) 65 - 99 mg/dL   BUN 25 (H) 6 - 20 mg/dL   Creatinine, Ser 0.452.94 (H) 0.44 - 1.00 mg/dL   Calcium 9.5 8.9 - 40.910.3 mg/dL   Total Protein 7.9 6.5 - 8.1 g/dL   Albumin 2.8 (L) 3.5 - 5.0 g/dL   AST 57 (H) 15 - 41 U/L   ALT 27 14 - 54 U/L   Alkaline Phosphatase 45 38 - 126 U/L   Total Bilirubin 3.7 (H) 0.3 - 1.2 mg/dL   GFR calc non Af Amer 18 (L) >60 mL/min   GFR calc Af Amer 21 (L) >60 mL/min   Anion gap 9 5 - 15  Protime-INR     Status: Abnormal   Collection Time: 03/29/15 12:18 AM  Result Value Ref Range   Prothrombin Time 21.4 (H) 11.6 - 15.2 seconds   INR 1.86 (H) 0.00 - 1.49  Magnesium     Status: Abnormal   Collection Time: 03/29/15 12:18 AM  Result Value Ref Range   Magnesium 1.6 (L) 1.7 - 2.4 mg/dL  Comprehensive metabolic panel     Status: Abnormal   Collection Time: 03/29/15 12:18 AM  Result Value Ref Range   Sodium 139 135 - 145  mmol/L   Potassium 3.6 3.5 - 5.1 mmol/L   Chloride 114 (H) 101 - 111 mmol/L   CO2 18 (L) 22 - 32 mmol/L   Glucose, Bld 123 (H) 65 - 99 mg/dL   BUN 22 (H) 6 - 20 mg/dL   Creatinine, Ser 8.112.82 (H) 0.44 - 1.00 mg/dL   Calcium 9.3 8.9 - 91.410.3 mg/dL   Total Protein 6.8 6.5 - 8.1 g/dL   Albumin 2.6 (L) 3.5 - 5.0 g/dL   AST 54 (H) 15 - 41 U/L   ALT 27 14 - 54 U/L   Alkaline Phosphatase 43 38 - 126 U/L   Total Bilirubin 3.6 (H) 0.3 - 1.2 mg/dL   GFR calc non Af Amer 19 (L) >60 mL/min   GFR calc Af Amer 22 (L) >60 mL/min   Anion gap 7 5 - 15    Studies/Results: Ct Head Wo Contrast  03/27/2015  CLINICAL DATA:  Seizure activity with vomiting EXAM: CT HEAD WITHOUT CONTRAST TECHNIQUE: Contiguous axial images were obtained from the base of  the skull through the vertex without intravenous contrast. COMPARISON:  08/31/2007 FINDINGS: The bony calvarium is intact. The ventricles are of normal size and configuration. No findings to suggest acute hemorrhage, acute infarction or space-occupying mass lesion are noted. IMPRESSION: No acute abnormality noted. Electronically Signed   By: Alcide Clever M.D.   On: 03/27/2015 14:51   Mr Brain Wo Contrast  03/27/2015  CLINICAL DATA:  New onset seizure. The patient has had 2 previous seizures in her life. EXAM: MRI HEAD WITHOUT CONTRAST TECHNIQUE: Multiplanar, multiecho pulse sequences of the brain and surrounding structures were obtained without intravenous contrast. COMPARISON:  CT head from the same day. FINDINGS: The diffusion-weighted images demonstrate no acute or subacute infarction. The study is mildly degraded by patient motion. Mild periventricular T2 changes are present. No other significant white matter disease is noted. The internal auditory canals are within normal limits. The skullbase and brainstem are unremarkable. Dedicated imaging of the temporal lobes demonstrates symmetric size and signal of the hippocampal structures. Flow is present in the major  intracranial arteries. The globes and orbits are intact. The paranasal sinuses and mastoid air cells are clear. IMPRESSION: 1. No acute or focal lesion to explain the patient's seizures. 2. Mild periventricular T2 changes are slightly greater than expected for age. The finding is nonspecific but can be seen in the setting of chronic microvascular ischemia, a demyelinating process such as multiple sclerosis, vasculitis, complicated migraine headaches, or as the sequelae of a prior infectious or inflammatory process. Electronically Signed   By: Marin Roberts M.D.   On: 03/27/2015 20:04      Assessment: 1. Cirrhosis, likely alcoholic, serum actin level borderline elevated 2. Acute renal failure, continuing to improve 3. Encephalopathy improving 4. Coagulopathy stable to slightly improving  Plan: 1. Will check serum protein electrophoresis 2. Await hemachromatosis genetic markers 3. Continue lactulose and rifaximin 4. Possible discharge tomorrow. Would like to have GI follow-up locally. Would arrange follow-up with Dr. Ewing Schlein as an outpatient.   Thula Stewart C 03/29/2015, 9:45 AM  Pager [phone] If no answer or after 5 PM call [phone]

## 2015-03-30 DIAGNOSIS — N179 Acute kidney failure, unspecified: Secondary | ICD-10-CM

## 2015-03-30 DIAGNOSIS — D696 Thrombocytopenia, unspecified: Secondary | ICD-10-CM

## 2015-03-30 DIAGNOSIS — R569 Unspecified convulsions: Secondary | ICD-10-CM

## 2015-03-30 LAB — COMPREHENSIVE METABOLIC PANEL
ALBUMIN: 2.6 g/dL — AB (ref 3.5–5.0)
ALK PHOS: 51 U/L (ref 38–126)
ALT: 35 U/L (ref 14–54)
AST: 68 U/L — ABNORMAL HIGH (ref 15–41)
Anion gap: 7 (ref 5–15)
BILIRUBIN TOTAL: 3.1 mg/dL — AB (ref 0.3–1.2)
BUN: 18 mg/dL (ref 6–20)
CALCIUM: 9.3 mg/dL (ref 8.9–10.3)
CO2: 19 mmol/L — AB (ref 22–32)
CREATININE: 2.72 mg/dL — AB (ref 0.44–1.00)
Chloride: 112 mmol/L — ABNORMAL HIGH (ref 101–111)
GFR calc Af Amer: 23 mL/min — ABNORMAL LOW (ref >60)
GFR calc non Af Amer: 20 mL/min — ABNORMAL LOW (ref >60)
GLUCOSE: 100 mg/dL — AB (ref 65–99)
Potassium: 3.9 mmol/L (ref 3.5–5.1)
SODIUM: 138 mmol/L (ref 135–145)
TOTAL PROTEIN: 7.2 g/dL (ref 6.5–8.1)

## 2015-03-30 LAB — PROTEIN ELECTROPHORESIS, SERUM
A/G Ratio: 0.7 (ref 0.7–1.7)
ALBUMIN ELP: 2.9 g/dL (ref 2.9–4.4)
ALPHA-1-GLOBULIN: 0.2 g/dL (ref 0.0–0.4)
ALPHA-2-GLOBULIN: 0.4 g/dL (ref 0.4–1.0)
Beta Globulin: 2.2 g/dL — ABNORMAL HIGH (ref 0.7–1.3)
GAMMA GLOBULIN: 1.4 g/dL (ref 0.4–1.8)
GLOBULIN, TOTAL: 4.3 g/dL — AB (ref 2.2–3.9)
M-Spike, %: 0.7 g/dL — ABNORMAL HIGH
TOTAL PROTEIN ELP: 7.2 g/dL (ref 6.0–8.5)

## 2015-03-30 MED ORDER — RIFAXIMIN 550 MG PO TABS: 550.0000 mg | ORAL_TABLET | Freq: Three times a day (TID) | ORAL | Status: DC

## 2015-03-30 MED ORDER — LACTULOSE 10 GM/15ML PO SOLN: 30.0000 g | Freq: Three times a day (TID) | ORAL | Status: DC

## 2015-03-30 MED ORDER — LEVETIRACETAM 250 MG PO TABS: 250.0000 mg | ORAL_TABLET | Freq: Two times a day (BID) | ORAL | Status: DC

## 2015-03-30 NOTE — Discharge Summary (Signed)
Physician Discharge Summary  Julie Rowland:[id number] DOB: [birth year] DOA: 03/22/2015  PCP: Lora Paula, MD  Admit date: 03/22/2015 Discharge date: 03/30/2015  Time spent: Greater than 30 minutes  Recommendations for Outpatient Follow-up:  1. [facility]/PCP on 04/01/15 at 2 PM. To be seen with repeat labs (CBC & CMP) 2. Ambulatory referable to neurology made: Patient should receive a call from [facility] neurology associates for follow-up appointment to be seen in approximately 4 weeks 3. Dr. Vida Rigger, [facility] in 2 weeks  Discharge Diagnoses:  Principal Problem:   Encephalopathy, hepatic (HCC) Active Problems:   Portal hypertension (HCC)   Hepatic cirrhosis (HCC)   Thrombocytopenia (HCC)   Anemia, chronic disease   AKI (acute kidney injury) (HCC)   Encephalopathy acute   Convulsions/seizures (HCC)   Discharge Condition: Improved & Stable  Diet recommendation: Heart healthy diet.  Filed Weights   03/28/15 0655 03/29/15 0513 03/30/15 0634  Weight: 91.6 kg (201 lb 15.1 oz) 91.3 kg (201 lb 4.5 oz) 95.4 kg (210 lb 5.1 oz)    History of present illness:  44 year old female patient with history of [place]/alcoholic cirrhosis complicated by portal hypertension, esophageal varices, hepatic encephalopathy & coagulopathy, HTN, pancytopenia, seizures, depression, recent hospitalization 02/19/15-02/23/15 for abdominal pain, suspected SBP, underwent EGD which showed esophageal ulcerations & hepatic encephalopathy, was discharged on lactulose, PPI, sucralfate, Lasix and Aldactone, said to have improved and at baseline since discharge until 2 days ago when she got progressively somnolent, confused and jaundiced and presented to [facility] ED on 03/21/15. In the ED, creatinine 7 (discharged recently at 2.6), elevated lactate and ammonia 214.   Hospital Course:   Dehydration  - Secondary to poor oral intake,? GI losses from lactulose and diuretics.   - Resolved after holding diuretics and hydrating with IV fluids. Clinically euvolemic. Thereby continue to hold diuretics at discharge, especially given recent episode of acute kidney injury. Closely follow as outpatient both clinically and with repeat labs and consider resuming diuretics as deemed necessary.  Acute on chronic kidney disease stage IV - Last normal creatinine was on 12/14/14:1.06. Since 01/20/15, creatinine steadily increased from 1.6-2.6 at discharge on 02/25/15 - Now presented with creatinine of 7.09 - DD: Prerenal from dehydration, hepatorenal syndrome - Renal ultrasound: No hydronephrosis - Nephrology was consulted. Creatinine continued to improve with IV fluid resuscitation and there was no evidence of volume excess. She continued to have good urine output. Volume repletion was clearly playing a role here (36 pound drop in weight since 02/22/15 from 238 at last hospital admission >202 on admission this time). No edema and no ascites on ultrasound this admission. Curiously FENa was not low but this may have been because of diuretics in her system. Good and increasing urine output coupled with continued improvement in renal function excludes hepatorenal syndrome as part of her renal failure at least at this juncture.  Hypokalemia - Replaced  Acute hepatic encephalopathy - encephalopathy may be a combination of hepatic encephalopathy +/- uremia - Resolved with normal ammonia levels and no asterixis at DC. Continue lactulose, rifaximin. Close outpatient follow-up.  Cirrhosis (NASH Vs Alcoholic) complicated by portal hypertension, esophageal varices, hepatic encephalopathy, coagulopathy - Cirrhosis apparently diagnosed 6 months ago and currently decompensated. Nonalcoholic mediated fatty liver is suspected etiology. She does have history of alcohol use. - Recent EGD had shown esophageal ulcers.  - Continue PPI at discharge - [facility] was consulted for assistance. Outpatient  follow-up with GI - can coordinate  evaluation at the transplant center. - She should be evaluated at a transplant center as outpatient, particularly given her young age, regarding candidacy for possible liver transplant. - [facility] sent of work up in the hospital which can be followed by them during OP follow up. - INR 113, TIBC 220, saturation ratios 51, ferritin 504, hepatitis B surface antigen: Negative, Hep B S Ab: Non reactive; Hep B Core Ab: Neg; HCV Ab: Neg Mitochondrial M2 Ab, IgG: Neg; anti smooth muscle Ab, IgG: 31/moderate to strong positive ? Significance, ANA: neg; Ceruloplasmin: 17.5,  Serun IgG: 1804/elevated, IgA: 1534/elevated, IgM: normal  Lactic acidosis - Most likely secondary to poor tissue perfusion from dehydration and poor clearance related to liver disease - Persistently elevated lactate may be secondary to underlying liver disease.  Hypercalcemia - Unknown if she was taking calcium or vitamin D as outpatient. Was normal during 02/2015 admission. 25D normal & PTH 35. Improved.  Depression  - Hold medications.  Anemia and thrombocytopenia - No reported bleeding. Stable. Outpatient follow-up.  Documented seizure history with seizures - Unclear etiology. Patient had 3 unprovoked seizures. - MRI brain, CT head and EEG were unremarkable - Neurology was consulted and recommended continuing Keppra and outpatient follow-up with neurology. - She was advised not to drive.   Consultants:  Nephrology   [facility]  Procedures:  EEG 03/28/2015: Clinical Interpretation: This normal EEG is recorded in the waking and sleep state. There was no seizure or seizure predisposition recorded on this study. Please note that a normal EEG does not rule out a diagnosis of epilepsy.    Antibiotics:  None Consultations:  Nephrology  [facility]  Neurology  Procedures:  EEG    Discharge Exam:  Complaints:  Denies complaints. Anxious to go home. No pain reported. Denies  dyspnea, dizziness or lightheadedness. As per nursing, no acute events. As per nursing, patient ambulates steadily.  Filed Vitals:   03/29/15 0513 03/29/15 1333 03/29/15 2135 03/30/15 0634  BP: 119/70 144/72 139/73 136/80  Pulse: 87 74 84 84  Temp: 98.3 F (36.8 C) 98.2 F (36.8 C) 98.1 F (36.7 C) 98.2 F (36.8 C)  TempSrc: Oral Oral Oral Oral  Resp: Height:      Weight: 91.3 kg (201 lb 4.5 oz)   95.4 kg (210 lb 5.1 oz)  SpO2: 96% 100% 100% 98%    General exam: Pleasant young female sitting up comfortably in chair this morning.  Respiratory system: Clear. No increased work of breathing. Cardiovascular system: S1 & S2 heard, RRR. No JVD, murmurs, gallops, clicks. Trace bilateral ankle edema. Not on telemetry. Gastrointestinal system: Abdomen is nondistended, soft and nontender. Normal bowel sounds heard. Central nervous system: Alert and oriented. No focal neurological deficits. No asterixis. Extremities: Symmetric 5 x 5 power.  Discharge Instructions      Discharge Instructions    Ambulatory referral to Neurology    Complete by:  As directed   An appointment is requested in approximately: 4 weeks     Call MD for:  difficulty breathing, headache or visual disturbances    Complete by:  As directed      Call MD for:  extreme fatigue    Complete by:  As directed      Call MD for:  persistant dizziness or light-headedness    Complete by:  As directed      Call MD for:  persistant nausea and vomiting    Complete by:  As directed  Call MD for:  severe uncontrolled pain    Complete by:  As directed      Call MD for:  temperature >100.4    Complete by:  As directed      Call MD for:    Complete by:  As directed   Confusion or altered mental status.     Diet - low sodium heart healthy    Complete by:  As directed      Driving Restrictions    Complete by:  As directed   No driving for 6 months or until cleared by physician during outpatient follow-up. Patient  also advised that she should not be at heights, in standing water, or operate heavy machinery or be in any situation that might put her or others at bodily harm in case she were to have a seizure.     Increase activity slowly    Complete by:  As directed             Medication List    STOP taking these medications        furosemide 40 MG tablet  Commonly known as:  LASIX     spironolactone 100 MG tablet  Commonly known as:  ALDACTONE     zolpidem 10 MG tablet  Commonly known as:  AMBIEN      TAKE these medications        lactulose 10 GM/15ML solution  Commonly known as:  CHRONULAC  Take 45 mLs (30 g total) by mouth 3 (three) times daily.     levETIRAcetam 250 MG tablet  Commonly known as:  KEPPRA  Take 1 tablet (250 mg total) by mouth 2 (two) times daily.     pantoprazole 40 MG tablet  Commonly known as:  PROTONIX  Take 1 tablet (40 mg total) by mouth 2 (two) times daily.     rifaximin 550 MG Tabs tablet  Commonly known as:  XIFAXAN  Take 1 tablet (550 mg total) by mouth 3 (three) times daily.     sertraline 25 MG tablet  Commonly known as:  ZOLOFT  Take 1 tablet (25 mg total) by mouth daily.       Follow-up Information    Follow up with [facility]: [facility] Rh[facility] On 04/01/2015.   Why:  2 pm for hospital follow up. To be seen with repeat labs (CBC & CMP).    Contact information:   [address] [place] [id number] [phone]       The results of significant diagnostics from this hospitalization (including imaging, microbiology, ancillary and laboratory) are listed below for reference.    Significant Diagnostic Studies: Ct Head Wo Contrast  03/27/2015  CLINICAL DATA:  Seizure activity with vomiting EXAM: CT HEAD WITHOUT CONTRAST TECHNIQUE: Contiguous axial images were obtained from the base of the skull through the vertex without intravenous contrast. COMPARISON:  08/31/2007 FINDINGS: The bony calvarium is intact. The  ventricles are of normal size and configuration. No findings to suggest acute hemorrhage, acute infarction or space-occupying mass lesion are noted. IMPRESSION: No acute abnormality noted. Electronically Signed   By: Alcide CleverMark  Lukens M.D.   On: 03/27/2015 14:51   Mr Brain Wo Contrast  03/27/2015  CLINICAL DATA:  New onset seizure. The patient has had 2 previous seizures in her life. EXAM: MRI HEAD WITHOUT CONTRAST TECHNIQUE: Multiplanar, multiecho pulse sequences of the brain and surrounding structures were obtained without intravenous contrast. COMPARISON:  CT head from the same day.  FINDINGS: The diffusion-weighted images demonstrate no acute or subacute infarction. The study is mildly degraded by patient motion. Mild periventricular T2 changes are present. No other significant white matter disease is noted. The internal auditory canals are within normal limits. The skullbase and brainstem are unremarkable. Dedicated imaging of the temporal lobes demonstrates symmetric size and signal of the hippocampal structures. Flow is present in the major intracranial arteries. The globes and orbits are intact. The paranasal sinuses and mastoid air cells are clear. IMPRESSION: 1. No acute or focal lesion to explain the patient's seizures. 2. Mild periventricular T2 changes are slightly greater than expected for age. The finding is nonspecific but can be seen in the setting of chronic microvascular ischemia, a demyelinating process such as multiple sclerosis, vasculitis, complicated migraine headaches, or as the sequelae of a prior infectious or inflammatory process. Electronically Signed   By: Marin Roberts M.D.   On: 03/27/2015 20:04   US Renal  03/22/2015  CLINICAL DATA:  Renal disease, hypertension EXAM: RENAL / URINARY TRACT ULTRASOUND COMPLETE COMPARISON:  CT 02/20/2015 FINDINGS: Right Kidney: Length: 11.1 cm. Echogenicity within normal limits. No mass or hydronephrosis visualized. Left Kidney: Length: 10.7 cm.  Echogenicity within normal limits. No mass or hydronephrosis visualized. Bladder: Appears normal for degree of bladder distention. IMPRESSION: Normal renal ultrasound. Electronically Signed   By: Charlett Nose M.D.   On: 03/22/2015 09:10   US Abdomen Limited  03/22/2015  CLINICAL DATA:  Hepatic cirrhosis. Abdominal distention. Evaluate for ascites. EXAM: LIMITED ABDOMEN ULTRASOUND FOR ASCITES TECHNIQUE: Limited ultrasound survey for ascites was performed in all four abdominal quadrants. COMPARISON:  None. FINDINGS: No ascites visualized within the abdomen or pelvis. IMPRESSION: No evidence of ascites. Electronically Signed   By: Myles Rosenthal M.D.   On: 03/22/2015 23:34   Dg Chest Port 1 View  03/22/2015  CLINICAL DATA:  44 year old female with altered mental status, confusion. Initial encounter. EXAM: PORTABLE CHEST 1 VIEW COMPARISON:  06/24/2014 and earlier. FINDINGS: Portable AP semi upright views at 1053 hours. Stable to mildly lower lung volumes. Allowing for portable technique, the lungs are clear. No pneumothorax. Normal cardiac size and mediastinal contours. Visualized tracheal air column is within normal limits. IMPRESSION: No acute cardiopulmonary abnormality. Electronically Signed   By: Odessa Fleming M.D.   On: 03/22/2015 11:04    Microbiology: Recent Results (from the past 240 hour(s))  MRSA PCR Screening     Status: None   Collection Time: 03/22/15  1:20 PM  Result Value Ref Range Status   MRSA by PCR NEGATIVE NEGATIVE Final    Comment:        The GeneXpert MRSA Assay (FDA approved for NASAL specimens only), is one component of a comprehensive MRSA colonization surveillance program. It is not intended to diagnose MRSA infection nor to guide or monitor treatment for MRSA infections.      Labs: Basic Metabolic Panel:  Recent Labs Lab 03/24/15 0426 03/25/15 0528  03/27/15 0431 03/28/15 0650 03/28/15 1305 03/29/15 0018 03/30/15 0640  NA 141 141  < > 145 143 142 139 138  K  3.3* 3.3*  < > 4.0 3.3* 3.1* 3.6 3.9  CL 103 105  < > 115* 113* 114* 114* 112*  CO2 25 24  < > 20* 18* 19* 18* 19*  GLUCOSE 92 92  < > 96 95 155* 123* 100*  BUN 49* 35*  < > 33* 28* 25* 22* 18  CREATININE 4.86* 4.27*  < > 3.31* 2.93* 2.94* 2.82*  2.72*  CALCIUM 10.0 9.6  < > 9.9 9.6 9.5 9.3 9.3  MG  --   --   --   --   --   --  1.6*  --   PHOS 3.3 2.9  --   --   --  2.8  --   --   < > = values in this interval not displayed. Liver Function Tests:  Recent Labs Lab 03/26/15 0414 03/27/15 0431 03/28/15 1305 03/29/15 0018 03/30/15 0640  AST 46* 37 57* 54* 68*  ALT 35  ALKPHOS 41 42 45 43 51  BILITOT 4.2* 3.9* 3.7* 3.6* 3.1*  PROT 7.6 7.3 7.9 6.8 7.2  ALBUMIN 2.8* 2.7* 2.8* 2.6* 2.6*   No results for input(s): LIPASE, AMYLASE in the last 168 hours.  Recent Labs Lab 03/25/15 1050 03/27/15 1505 03/29/15 1322  AMMONIA 33 66* 35   CBC:  Recent Labs Lab 03/26/15 0414  WBC 7.8  NEUTROABS 5.0  HGB 8.7*  HCT 25.8*  MCV 98.5  PLT 77*   Cardiac Enzymes: No results for input(s): CKTOTAL, CKMB, CKMBINDEX, TROPONINI in the last 168 hours. BNP: BNP (last 3 results) No results for input(s): BNP in the last 8760 hours.  ProBNP (last 3 results) No results for input(s): PROBNP in the last 8760 hours.  CBG: No results for input(s): GLUCAP in the last 168 hours.      Signed:  Marcellus Scott, MD, FACP, FHM. [facility] Pager [phone]  If 7PM-7AM, please contact night-coverage [email or web address] Password [email or web address] 03/30/2015, 1:20 PM

## 2015-03-30 NOTE — Discharge Instructions (Signed)
Acute Kidney Injury Acute kidney injury is any condition in which there is sudden (acute) damage to the kidneys. Acute kidney injury was previously known as acute kidney failure or acute renal failure. The kidneys are two organs that lie on either side of the spine between the middle of the back and the front of the abdomen. The kidneys:  Remove wastes and extra water from the blood.   Produce important hormones. These help keep bones strong, regulate blood pressure, and help create red blood cells.   Balance the fluids and chemicals in the blood and tissues. A small amount of kidney damage may not cause problems, but a large amount of damage may make it difficult or impossible for the kidneys to work the way they should. Acute kidney injury may develop into long-lasting (chronic) kidney disease. It may also develop into a life-threatening disease called end-stage kidney disease. Acute kidney injury can get worse very quickly, so it should be treated right away. Early treatment may prevent other kidney diseases from developing. CAUSES   A problem with blood flow to the kidneys. This may be caused by:   Blood loss.   Heart disease.   Severe burns.   Liver disease.  Direct damage to the kidneys. This may be caused by:  Some medicines.   A kidney infection.   Poisoning or consuming toxic substances.   A surgical wound.   A blow to the kidney area.   A problem with urine flow. This may be caused by:   Cancer.   Kidney stones.   An enlarged prostate. SIGNS AND SYMPTOMS   Swelling (edema) of the legs, ankles, or feet.   Tiredness (lethargy).   Nausea or vomiting.   Confusion.   Problems with urination, such as:   Painful or burning feeling during urination.   Decreased urine production.   Frequent accidents in children who are potty trained.   Bloody urine.   Muscle twitches and cramps.   Shortness of breath.   Seizures.   Chest  pain or pressure. Sometimes, no symptoms are present. DIAGNOSIS Acute kidney injury may be detected and diagnosed by tests, including blood, urine, imaging, or kidney biopsy tests.  TREATMENT Treatment of acute kidney injury varies depending on the cause and severity of the kidney damage. In mild cases, no treatment may be needed. The kidneys may heal on their own. If acute kidney injury is more severe, your health care provider will treat the cause of the kidney damage, help the kidneys heal, and prevent complications from occurring. Severe cases may require a procedure to remove toxic wastes from the body (dialysis) or surgery to repair kidney damage. Surgery may involve:   Repair of a torn kidney.   Removal of an obstruction. HOME CARE INSTRUCTIONS  Follow your prescribed diet.  Take medicines only as directed by your health care provider.  Do not take any new medicines (prescription, over-the-counter, or nutritional supplements) unless approved by your health care provider. Many medicines can worsen your kidney damage or may need to have the dose adjusted.   Keep all follow-up visits as directed by your health care provider. This is important.  Observe your condition to make sure you are healing as expected. SEEK IMMEDIATE MEDICAL CARE IF:  You are feeling ill or have severe pain in the back or side.   Your symptoms return or you have new symptoms.  You have any symptoms of end-stage kidney disease. These include:   Persistent itchiness.  Loss of appetite.   Headaches.   Abnormally dark or light skin.  Numbness in the hands or feet.   Easy bruising.   Frequent hiccups.   Menstruation stops.   You have a fever.  You have increased urine production.  You have pain or bleeding when urinating. MAKE SURE YOU:   Understand these instructions.  Will watch your condition.  Will get help right away if you are not doing well or get worse.   This  information is not intended to replace advice given to you by your health care provider. Make sure you discuss any questions you have with your health care provider.   Document Released: 10/16/2010 Document Revised: 04/23/2014 Document Reviewed: 11/30/2011 Elsevier Interactive Patient Education [employer]  Epilepsy Epilepsy is a disorder in which a person has repeated seizures over time. A seizure is a release of abnormal electrical activity in the brain. Seizures can cause a change in attention, behavior, or the ability to remain awake and alert (altered mental status). Seizures often involve uncontrollable shaking (convulsions).  Most people with epilepsy lead normal lives. However, people with epilepsy are at an increased risk of falls, accidents, and injuries. Therefore, it is important to begin treatment right away. CAUSES  Epilepsy has many possible causes. Anything that disturbs the normal pattern of brain cell activity can lead to seizures. This may include:   Head injury.  Birth trauma.  High fever as a child.  Stroke.  Bleeding into or around the brain.  Certain drugs.  Prolonged low oxygen, such as what occurs after CPR efforts.  Abnormal brain development.  Certain illnesses, such as meningitis, encephalitis (brain infection), malaria, and other infections.  An imbalance of nerve signaling chemicals (neurotransmitters).  SIGNS AND SYMPTOMS  The symptoms of a seizure can vary greatly from one person to another. Right before a seizure, you may have a warning (aura) that a seizure is about to occur. An aura may include the following symptoms:  Fear or anxiety.  Nausea.  Feeling like the room is spinning (vertigo).  Vision changes, such as seeing flashing lights or spots. Common symptoms during a seizure include:  Abnormal sensations, such as an abnormal smell or a bitter taste in the mouth.   Sudden, general body stiffness.   Convulsions that involve  rhythmic jerking of the face, arm, or leg on one or both sides.   Sudden change in consciousness.   Appearing to be awake but not responding.   Appearing to be asleep but cannot be awakened.   Grimacing, chewing, lip smacking, drooling, tongue biting, or loss of bowel or bladder control. After a seizure, you may feel sleepy for a while. DIAGNOSIS  Your health care provider will ask about your symptoms and take a medical history. Descriptions from any witnesses to your seizures will be very helpful in the diagnosis. A physical exam, including a detailed neurological exam, is necessary. Various tests may be done, such as:   An electroencephalogram (EEG). This is a painless test of your brain waves. In this test, a diagram is created of your brain waves. These diagrams can be interpreted by a specialist.  An MRI of the brain.   A CT scan of the brain.   A spinal tap (lumbar puncture, LP).  Blood tests to check for signs of infection or abnormal blood chemistry. TREATMENT  There is no cure for epilepsy, but it is generally treatable. Once epilepsy is diagnosed, it is important to begin treatment as  soon as possible. For most people with epilepsy, seizures can be controlled with medicines. The following may also be used:  A pacemaker for the brain (vagus nerve stimulator) can be used for people with seizures that are not well controlled by medicine.  Surgery on the brain. For some people, epilepsy eventually goes away. HOME CARE INSTRUCTIONS   Follow your health care provider's recommendations on driving and safety in normal activities.  Get enough rest. Lack of sleep can cause seizures.  Only take over-the-counter or prescription medicines as directed by your health care provider. Take any prescribed medicine exactly as directed.  Avoid any known triggers of your seizures.  Keep a seizure diary. Record what you recall about any seizure, especially any possible trigger.    Make sure the people you live and work with know that you are prone to seizures. They should receive instructions on how to help you. In general, a witness to a seizure should:   Cushion your head and body.   Turn you on your side.   Avoid unnecessarily restraining you.   Not place anything inside your mouth.   Call for emergency medical help if there is any question about what has occurred.   Follow up with your health care provider as directed. You may need regular blood tests to monitor the levels of your medicine.  SEEK MEDICAL CARE IF:   You develop signs of infection or other illness. This might increase the risk of a seizure.   You seem to be having more frequent seizures.   Your seizure pattern is changing.  SEEK IMMEDIATE MEDICAL CARE IF:   You have a seizure that does not stop after a few moments.   You have a seizure that causes any difficulty in breathing.   You have a seizure that results in a very severe headache.   You have a seizure that leaves you with the inability to speak or use a part of your body.    This information is not intended to replace advice given to you by your health care provider. Make sure you discuss any questions you have with your health care provider.   Document Released: 04/02/2005 Document Revised: 01/21/2013 Document Reviewed: 11/12/2012 Elsevier Interactive Patient Education 2016 Else[employer] Inc[employer]  Hepatic Encephalopathy Hepatic encephalopathy is a loss of brain function from advanced liver disease. The effects of the condition depend on the type of liver damage and how severe it is. In some cases, hepatic encephalopathy can be reversed. CAUSES The exact cause of hepatic encephalopathy is not known. RISK FACTORS You have a higher risk of getting this condition if your liver is damaged. When the liver is damaged harmful substances called toxins can build up in the body. Certain toxins, such as ammonia, can harm your  brain. Conditions that can cause liver damage include:  An infection.  Dehydration.  Intestinal bleeding.  Drinking too much alcohol.  Taking certain medicines, including tranquilizers, water pills (diuretics), antidepressants, or sleeping pills. SIGNS AND SYMPTOMS Signs and symptoms may develop suddenly. Or, they may develop slowly and get worse gradually. Symptoms can range from mild to severe. Mild Hepatic Encephalopathy  Mild confusion.  Personality and mood changes.  Anxiety and agitation.  Drowsiness.  Loss of mental abilities.  Musty or sweet-smelling breath. Worsening or Severe Hepatic Encephalopathy  Slowed movement.  Slurred speech.  Extreme personality changes.  Disorientation.  Abnormal shaking or flapping of the hands.  Coma. DIAGNOSIS To make a diagnosis, your health care provider  will do a physical exam. To rule out other causes of your signs and symptoms, he or she may order tests. You may have:  Blood tests. These may be done to check your ammonia level, measure how long it takes your blood to clot, and check for infection.  Liver function tests. These may be done to check how well your liver is working.  MRI and CT scans. These may be done to check for a brain disorder.  Electroencephalogram (EEG). This may be done to measure the electrical activity in your brain. TREATMENT The first step in treatment is identifying and treating possible triggers. The next step is involves taking medicine to lower the level of toxins in the body and to prevent ammonia from building up. You may need to take:  Antibiotics to reduce the ammonia-producing bacteria in your gut.  Lactulose to help flush ammonia from the gut. HOME CARE INSTRUCTIONS Eating and Drinking  Follow a low-protein diet that includes plenty of fruits, vegetables, and whole grains, as directed by your health care provider. Ammonia is produced when you digest high-protein foods.  Work with  a Data processing managerdietitian or with your health care provider to make sure you are getting the right balance of protein and minerals.  Drink enough fluids to keep your urine clear or pale yellow. Drinking plenty of water helps prevent constipation.  Do not drink alcohol or use illegal drugs. Medicines  Only take medicine as directed by your health care provider.  If you were prescribed an antibiotic medicine, finish it all even if you start to feel better.  Do not start any new medicines, including over-the-counter medicines, without first checking with your health care provider. SEEK MEDICAL CARE IF:  You have new symptoms.  Your symptoms change.  Your symptoms get worse.  You have a fever.  You are constipated.  You have persistent nausea, vomiting, or diarrhea. SEEK IMMEDIATE MEDICAL CARE IF:  You become very confused or drowsy.  You vomit blood or material that looks like coffee grounds.  Your stool is bloody or black or looks like tar.   This information is not intended to replace advice given to you by your health care provider. Make sure you discuss any questions you have with your health care provider.   Document Released: 06/12/2006 Document Revised: 04/23/2014 Document Reviewed: 11/18/2013 Elsevier Interactive Patient Education [employer] Inc[employer]

## 2015-03-31 LAB — HEMOCHROMATOSIS DNA-PCR(C282Y,H63D)

## 2015-04-01 ENCOUNTER — Ambulatory Visit: Payer: No Typology Code available for payment source | Attending: Family Medicine | Admitting: Family Medicine

## 2015-04-01 ENCOUNTER — Encounter: Payer: Self-pay | Admitting: Family Medicine

## 2015-04-01 VITALS — BP 137/77 | HR 77 | Temp 97.7°F | Resp 16 | Ht 69.0 in | Wt 214.0 lb

## 2015-04-01 DIAGNOSIS — Z79899 Other long term (current) drug therapy: Secondary | ICD-10-CM | POA: Insufficient documentation

## 2015-04-01 DIAGNOSIS — M7989 Other specified soft tissue disorders: Secondary | ICD-10-CM | POA: Insufficient documentation

## 2015-04-01 DIAGNOSIS — R7989 Other specified abnormal findings of blood chemistry: Secondary | ICD-10-CM

## 2015-04-01 DIAGNOSIS — F1310 Sedative, hypnotic or anxiolytic abuse, uncomplicated: Secondary | ICD-10-CM | POA: Insufficient documentation

## 2015-04-01 DIAGNOSIS — K7682 Hepatic encephalopathy: Secondary | ICD-10-CM

## 2015-04-01 DIAGNOSIS — K7460 Unspecified cirrhosis of liver: Secondary | ICD-10-CM | POA: Insufficient documentation

## 2015-04-01 DIAGNOSIS — Z87891 Personal history of nicotine dependence: Secondary | ICD-10-CM | POA: Insufficient documentation

## 2015-04-01 DIAGNOSIS — E7401 von Gierke disease: Secondary | ICD-10-CM

## 2015-04-01 DIAGNOSIS — F411 Generalized anxiety disorder: Secondary | ICD-10-CM

## 2015-04-01 DIAGNOSIS — K7030 Alcoholic cirrhosis of liver without ascites: Secondary | ICD-10-CM

## 2015-04-01 DIAGNOSIS — F329 Major depressive disorder, single episode, unspecified: Secondary | ICD-10-CM | POA: Insufficient documentation

## 2015-04-01 DIAGNOSIS — F32A Depression, unspecified: Secondary | ICD-10-CM

## 2015-04-01 DIAGNOSIS — K7290 Hepatic failure, unspecified without coma: Secondary | ICD-10-CM | POA: Insufficient documentation

## 2015-04-01 DIAGNOSIS — N179 Acute kidney failure, unspecified: Secondary | ICD-10-CM

## 2015-04-01 LAB — COMPLETE METABOLIC PANEL WITH GFR
ALBUMIN: 2.9 g/dL — AB (ref 3.6–5.1)
ALK PHOS: 54 U/L (ref 33–115)
ALT: 44 U/L — ABNORMAL HIGH (ref 6–29)
AST: 78 U/L — ABNORMAL HIGH (ref 10–30)
BILIRUBIN TOTAL: 3 mg/dL — AB (ref 0.2–1.2)
BUN: 22 mg/dL (ref 7–25)
CO2: 19 mmol/L — ABNORMAL LOW (ref 20–31)
Calcium: 9.3 mg/dL (ref 8.6–10.2)
Chloride: 108 mmol/L (ref 98–110)
Creat: 2.63 mg/dL — ABNORMAL HIGH (ref 0.50–1.10)
GFR, EST AFRICAN AMERICAN: 25 mL/min — AB (ref >=60)
GFR, EST NON AFRICAN AMERICAN: 21 mL/min — AB (ref >=60)
Glucose, Bld: 82 mg/dL (ref 65–99)
POTASSIUM: 3.9 mmol/L (ref 3.5–5.3)
SODIUM: 138 mmol/L (ref 135–146)
TOTAL PROTEIN: 7.3 g/dL (ref 6.1–8.1)

## 2015-04-01 LAB — AMMONIA: AMMONIA: 95 umol/L — AB (ref 16–53)

## 2015-04-01 MED ORDER — PANTOPRAZOLE SODIUM 40 MG PO TBEC: 40.0000 mg | DELAYED_RELEASE_TABLET | Freq: Two times a day (BID) | ORAL | Status: DC

## 2015-04-01 MED ORDER — SERTRALINE HCL 50 MG PO TABS: 50.0000 mg | ORAL_TABLET | Freq: Every day | ORAL | Status: DC

## 2015-04-01 MED ORDER — HYDROXYZINE HCL 25 MG PO TABS: 25.0000 mg | ORAL_TABLET | Freq: Three times a day (TID) | ORAL | Status: DC | PRN

## 2015-04-01 MED ORDER — LACTULOSE 10 GM/15ML PO SOLN: 30.0000 g | Freq: Three times a day (TID) | ORAL | Status: DC

## 2015-04-01 NOTE — Progress Notes (Signed)
Subjective:  Patient ID: Julie Rowland, female    DOB: 1970-09-05  Age: 44 y.o. MRN: [id number]  CC: Hospitalization Follow-up   HPI Julie Rowland presents for    1. Hospitalization follow up: she was hospitalized from 12/6-12/14/16 for confusion and somnolence. She subsequently developed seizure witnessed by her mother while in the hospital on 03/24/15.  This was her 3rd seizure in life. Her previous two seizures were in the setting of withdrawal from alcohol and xanax.   Her ammonia level was 214 at time of admission. She has neurology and GI consult during her hospitalization.  MRI was negative of acute infarction and lesions. There were some non-specific periventricular changes. EEG was negative. She was discharged on keppra. Regarding her encephalopathy she was seen by GI, ammonia levels improved and her encephalopathy improved. She was discharged on lactulose and rifaximin.  She has been scheduled for GI and neurology follow up.  Today she reports, not taking rifaximin due to cost. Taking TID lactulose with regular bowel movements. She is at times agitated and has trouble sleeping. She is at times confused. She presents today with her mother and sister. Her mother and sister corroborate that patient has hx of alcohol and xanax abuse. Her sister also gives a short history of cocaine use, this the patient denies. Patient denies ongoing ETOH although her family reports finding empty cans of [employer] hard lemonade in her car. Patient admits to swelling limited to her ankles. She denies tremor and bleeding. She admit to b/l calf pain.   Social History  Substance Use Topics  . Smoking status: Former Smoker -- 20 years    Types: Cigarettes    Quit date: 08/02/2014  . Smokeless tobacco: Never Used  . Alcohol Use: 0.0 oz/week    0 Standard drinks or equivalent per week     Comment: Has not had ETOH in 09/08/14    Outpatient Prescriptions Prior to Visit  Medication Sig Dispense  Refill  . lactulose (CHRONULAC) 10 GM/15ML solution Take 45 mLs (30 g total) by mouth 3 (three) times daily. 946 mL 1  . levETIRAcetam (KEPPRA) 250 MG tablet Take 1 tablet (250 mg total) by mouth 2 (two) times daily. 60 tablet 0  . pantoprazole (PROTONIX) 40 MG tablet Take 1 tablet (40 mg total) by mouth 2 (two) times daily. 60 tablet 0  . sertraline (ZOLOFT) 25 MG tablet Take 1 tablet (25 mg total) by mouth daily. 30 tablet 2  . rifaximin (XIFAXAN) 550 MG TABS tablet Take 1 tablet (550 mg total) by mouth 3 (three) times daily. (Patient not taking: Reported on 04/01/2015) 90 tablet 0   No facility-administered medications prior to visit.    ROS Review of Systems  Constitutional: Negative for fever and chills.  Eyes: Negative for visual disturbance.  Respiratory: Negative for shortness of breath.   Cardiovascular: Negative for chest pain and leg swelling (R ankle > L ankle ).  Gastrointestinal: Negative for nausea, vomiting, abdominal pain, diarrhea, blood in stool, abdominal distention, anal bleeding and rectal pain.  Musculoskeletal: Negative for back pain and arthralgias.  Skin: Negative for rash.  Allergic/Immunologic: Negative for immunocompromised state.  Hematological: Negative for adenopathy. Does not bruise/bleed easily.  Psychiatric/Behavioral: Positive for confusion, sleep disturbance, dysphoric mood, decreased concentration and agitation. Negative for suicidal ideas, hallucinations, behavioral problems and self-injury. The patient is nervous/anxious. The patient is not hyperactive.     Objective:  BP 137/77 mmHg  Pulse 77  Temp(Src) 97.7 F (36.5 C) (  Oral)  Resp 16  Ht 5\' 9"  (1.753 m)  Wt 214 lb (97.07 kg)  BMI 31.59 kg/m2  SpO2 100%  BP/Weight 04/01/2015 03/30/2015 03/22/2015  Systolic BP 137 136 -  Diastolic BP 77 80 -  Wt. (Lbs) 214 210.32 -  BMI 31.59 - 31.04    Physical Exam  Constitutional: She is oriented to person, place, and time. She appears  well-developed and well-nourished. No distress.  HENT:  Head: Normocephalic and atraumatic.  Cardiovascular: Normal rate, regular rhythm, normal heart sounds and intact distal pulses.   Pulmonary/Chest: Effort normal and breath sounds normal.  Musculoskeletal: She exhibits edema (trace edema in R ankle ) and tenderness (tenderness in both lower legs including calves without palpable cords or redness ).  Neurological: She is alert and oriented to person, place, and time. She displays no tremor.  Negative asterixis   Skin: Skin is warm and dry. No rash noted.  Psychiatric: She has a normal mood and affect.    Assessment & Plan:   Problem List Items Addressed This Visit    AKI (acute kidney injury) (HCC)   Relevant Orders   CBC   Anxiety state   Relevant Medications   sertraline (ZOLOFT) 50 MG tablet   hydrOXYzine (ATARAX/VISTARIL) 25 MG tablet   Depression    A: depression, anxiety, irritability P: Increase zoloft to 50 mg daily Start atarax prn anxiety and for sleep Stop ambien due to contraindication in hepatic disease       Relevant Medications   sertraline (ZOLOFT) 50 MG tablet   hydrOXYzine (ATARAX/VISTARIL) 25 MG tablet   Encephalopathy, hepatic (HCC) - Primary   Relevant Orders   COMPLETE METABOLIC PANEL WITH GFR   Ammonia   Hepatic cirrhosis (HCC)    A: hepatic cirrhosis that is most likely alcohol related with some NASH. Patient is also being evaluated for autoimmune hepatitis. P: Close f/u with GI Continue lactulose Check ammonia, CBC, CMP       Relevant Medications   pantoprazole (PROTONIX) 40 MG tablet   Increased ammonia level   Relevant Medications   lactulose (CHRONULAC) 10 GM/15ML solution      No orders of the defined types were placed in this encounter.    Follow-up: No Follow-up on file.   Dessa Phi MD

## 2015-04-01 NOTE — Assessment & Plan Note (Signed)
A: hepatic cirrhosis that is most likely alcohol related with some NASH. Patient is also being evaluated for autoimmune hepatitis. P: Close f/u with GI Continue lactulose Check ammonia, CBC, CMP

## 2015-04-01 NOTE — Progress Notes (Signed)
HFU confused Sz Doing good  Pain on legs Pain scale #8 No tobacco user  No suicidal thought in the past two week

## 2015-04-01 NOTE — Patient Instructions (Addendum)
Julie Rowland was seen today for hospitalization follow-up.  Diagnoses and all orders for this visit:  Encephalopathy, hepatic (HCC) -     COMPLETE METABOLIC PANEL WITH GFR -     Ammonia  AKI (acute kidney injury) (HCC) -     CBC  Depression -     sertraline (ZOLOFT) 50 MG tablet; Take 1 tablet (50 mg total) by mouth daily.  Increased ammonia level -     lactulose (CHRONULAC) 10 GM/15ML solution; Take 45 mLs (30 g total) by mouth 3 (three) times daily.  Alcoholic cirrhosis of liver without ascites (HCC) -     pantoprazole (PROTONIX) 40 MG tablet; Take 1 tablet (40 mg total) by mouth 2 (two) times daily.  Anxiety state -     hydrOXYzine (ATARAX/VISTARIL) 25 MG tablet; Take 1 tablet (25 mg total) by mouth 3 (three) times daily as needed for anxiety.   Unfortunately Ambien is not recommend in setting of liver impairment. Please STOP ambien and use atarax instead for anxiety and as a sleep aid  F/u in 6 weeks for liver disease   Dr. Armen PickupFunches

## 2015-04-01 NOTE — Assessment & Plan Note (Addendum)
A: depression, anxiety, irritability P: Increase zoloft to 50 mg daily Start atarax prn anxiety and for sleep Stop ambien due to contraindication in hepatic disease

## 2015-04-02 LAB — CBC
HCT: 25.2 % — ABNORMAL LOW (ref 36.0–46.0)
HEMOGLOBIN: 8.8 g/dL — AB (ref 12.0–15.0)
MCH: 33.1 pg (ref 26.0–34.0)
MCHC: 34.9 g/dL (ref 30.0–36.0)
MCV: 94.7 fL (ref 78.0–100.0)
MPV: 9.8 fL (ref 8.6–12.4)
Platelets: 71 10*3/uL — ABNORMAL LOW (ref 150–400)
RBC: 2.66 MIL/uL — AB (ref 3.87–5.11)
RDW: 15.4 % (ref 11.5–15.5)
WBC: 7.8 10*3/uL (ref 4.0–10.5)

## 2015-04-04 ENCOUNTER — Telehealth: Payer: Self-pay | Admitting: *Deleted

## 2015-04-04 DIAGNOSIS — E7401 von Gierke disease: Secondary | ICD-10-CM | POA: Insufficient documentation

## 2015-04-04 MED ORDER — LACTULOSE 10 GM/15ML PO SOLN: 30.0000 g | Freq: Four times a day (QID) | ORAL | Status: AC

## 2015-04-04 NOTE — Telephone Encounter (Signed)
LVM to return call.

## 2015-04-04 NOTE — Telephone Encounter (Signed)
-----   Message from Dessa PhiJosalyn Funches, MD sent at 04/04/2015  9:15 AM EST ----- Patient with stable anemia with Hgb of 8.8  Stable thrombocytopenia with plt of 71 Ammonia level is up from discharge to 95, 35 at discharge, goal is < 50  Cr is slightly improved but still elevated at 2.63  Patient has been referred to renal  Continue lactulose increase dose to 45 ml 4 times a day, be sure to stay compliant with lactulose

## 2015-04-04 NOTE — Telephone Encounter (Signed)
Pt in our clinic, date of birth verified by pt  Lab results given  Pt verbalized understanding  Referred to renal placed  Increased lactulose 45 ml QID

## 2015-04-04 NOTE — Addendum Note (Signed)
Addended by: Dessa PhiFUNCHES, Azrielle Springsteen on: 04/04/2015 09:22 AM   Modules accepted: Orders

## 2015-04-12 ENCOUNTER — Telehealth: Payer: Self-pay | Admitting: Family Medicine

## 2015-04-12 DIAGNOSIS — K7030 Alcoholic cirrhosis of liver without ascites: Secondary | ICD-10-CM

## 2015-04-12 DIAGNOSIS — N179 Acute kidney failure, unspecified: Secondary | ICD-10-CM

## 2015-04-12 DIAGNOSIS — R600 Localized edema: Secondary | ICD-10-CM

## 2015-04-12 NOTE — Telephone Encounter (Signed)
Pt.'s right ankle is severely swelling....pt. Would like to know if she could go back to Aldactone/Lasix....Marland Kitchen.Marland Kitchen.please follow up with patient

## 2015-04-14 MED ORDER — FUROSEMIDE 40 MG PO TABS: 40.0000 mg | ORAL_TABLET | Freq: Every day | ORAL | Status: DC

## 2015-04-14 NOTE — Assessment & Plan Note (Addendum)
A: reported R ankle swelling in cirrhosis and kidney disease P: Yes, lasix 40 mg daily Will need close f/u for Cr and K+ monitoring as kidneys are not functioning well  If K+ gets low will also need aldactone   BMP to be done in one week from restarting lasix, ordered

## 2015-04-14 NOTE — Telephone Encounter (Signed)
Yes, lasix 40 mg daily Will need close f/u for Cr and K+ monitoring as kidneys are not functioning well  If K+ gets low will also need aldactone   BMP to be done in one week from restarting lasix, ordered

## 2015-04-21 MED FILL — ?FUROSEMIDE 40 MG TABLET: 40 | 30 days supply | Qty: 30 | Fill #0

## 2015-04-21 NOTE — Telephone Encounter (Signed)
LVM to return call.

## 2015-04-22 ENCOUNTER — Ambulatory Visit: Payer: MEDICAID | Admitting: Diagnostic Neuroimaging

## 2015-04-26 MED FILL — ?PANTOPRAZOLE SOD DR 40MG: 40 MG | 30 days supply | Qty: 60 | Fill #1

## 2015-04-26 MED FILL — LACTULOSE 10 GM/15 ML SOLN: 10 | 7 days supply | Qty: 946 | Fill #1

## 2015-04-26 MED FILL — ?SERTRALINE HCL 50 MG TABLE: 50 | 30 days supply | Qty: 30 | Fill #1

## 2015-04-26 MED FILL — ?HYDROXYZINE HCL 25 MG TAB: 25 | 20 days supply | Qty: 60 | Fill #1

## 2015-04-27 ENCOUNTER — Telehealth: Payer: Self-pay | Admitting: Family Medicine

## 2015-04-27 DIAGNOSIS — R569 Unspecified convulsions: Secondary | ICD-10-CM

## 2015-04-27 MED FILL — LACTULOSE 10 GM/15 ML SOLN: 10 | 23 days supply | Qty: 4185 | Fill #0

## 2015-04-27 NOTE — Telephone Encounter (Signed)
Patient came in requesting a medication refill for levETIRAcetam (KEPPRA) Please follow up with patient.

## 2015-04-28 MED ORDER — LEVETIRACETAM 250 MG PO TABS: 250.0000 mg | ORAL_TABLET | Freq: Two times a day (BID) | ORAL | Status: DC

## 2015-04-28 MED FILL — ?LEVETIRACETAM 500 MG TABLE: 500 | 30 days supply | Qty: 30 | Fill #0

## 2015-04-28 NOTE — Telephone Encounter (Signed)
keppra refilled She will need to establish with neurology, I see she [facility], she needs to re-schedule this

## 2015-04-29 NOTE — Telephone Encounter (Signed)
Nurse called patient, patient verified date of birth. Patient has picked up prescription. Patient had to cancel appointment due to it would cost $200 each visit. Patient has orange card and is waiting for financial assistance approval.  N[facility] to check status of application.

## 2015-05-05 ENCOUNTER — Ambulatory Visit: Payer: No Typology Code available for payment source | Admitting: Nurse Practitioner

## 2015-05-09 ENCOUNTER — Ambulatory Visit: Payer: Self-pay | Admitting: Nurse Practitioner

## 2015-05-17 ENCOUNTER — Ambulatory Visit: Payer: Medicaid Other | Attending: Family Medicine | Admitting: Family Medicine

## 2015-05-17 ENCOUNTER — Encounter: Payer: Self-pay | Admitting: Family Medicine

## 2015-05-17 VITALS — BP 133/70 | HR 75 | Temp 98.4°F | Resp 16 | Wt 213.0 lb

## 2015-05-17 DIAGNOSIS — F329 Major depressive disorder, single episode, unspecified: Secondary | ICD-10-CM | POA: Diagnosis not present

## 2015-05-17 DIAGNOSIS — D638 Anemia in other chronic diseases classified elsewhere: Secondary | ICD-10-CM

## 2015-05-17 DIAGNOSIS — R7989 Other specified abnormal findings of blood chemistry: Secondary | ICD-10-CM

## 2015-05-17 DIAGNOSIS — R5383 Other fatigue: Secondary | ICD-10-CM | POA: Insufficient documentation

## 2015-05-17 DIAGNOSIS — F32A Depression, unspecified: Secondary | ICD-10-CM

## 2015-05-17 DIAGNOSIS — K7030 Alcoholic cirrhosis of liver without ascites: Secondary | ICD-10-CM

## 2015-05-17 DIAGNOSIS — K7682 Hepatic encephalopathy: Secondary | ICD-10-CM

## 2015-05-17 DIAGNOSIS — Z79899 Other long term (current) drug therapy: Secondary | ICD-10-CM | POA: Insufficient documentation

## 2015-05-17 DIAGNOSIS — G4700 Insomnia, unspecified: Secondary | ICD-10-CM | POA: Diagnosis not present

## 2015-05-17 DIAGNOSIS — Z87891 Personal history of nicotine dependence: Secondary | ICD-10-CM | POA: Insufficient documentation

## 2015-05-17 DIAGNOSIS — M545 Low back pain, unspecified: Secondary | ICD-10-CM | POA: Insufficient documentation

## 2015-05-17 DIAGNOSIS — M549 Dorsalgia, unspecified: Secondary | ICD-10-CM | POA: Insufficient documentation

## 2015-05-17 DIAGNOSIS — K7290 Hepatic failure, unspecified without coma: Secondary | ICD-10-CM | POA: Diagnosis not present

## 2015-05-17 DIAGNOSIS — Z0000 Encounter for general adult medical examination without abnormal findings: Secondary | ICD-10-CM

## 2015-05-17 LAB — COMPLETE METABOLIC PANEL WITH GFR
ALT: 41 U/L — ABNORMAL HIGH (ref 6–29)
AST: 66 U/L — ABNORMAL HIGH (ref 10–30)
Albumin: 2.6 g/dL — ABNORMAL LOW (ref 3.6–5.1)
Alkaline Phosphatase: 113 U/L (ref 33–115)
BILIRUBIN TOTAL: 4.3 mg/dL — AB (ref 0.2–1.2)
BUN: 14 mg/dL (ref 7–25)
CHLORIDE: 108 mmol/L (ref 98–110)
CO2: 22 mmol/L (ref 20–31)
Calcium: 8.7 mg/dL (ref 8.6–10.2)
Creat: 2.47 mg/dL — ABNORMAL HIGH (ref 0.50–1.10)
GFR, EST AFRICAN AMERICAN: 27 mL/min — AB (ref >=60)
GFR, EST NON AFRICAN AMERICAN: 23 mL/min — AB (ref >=60)
Glucose, Bld: 100 mg/dL — ABNORMAL HIGH (ref 65–99)
Potassium: 3.7 mmol/L (ref 3.5–5.3)
Sodium: 139 mmol/L (ref 135–146)
TOTAL PROTEIN: 7.1 g/dL (ref 6.1–8.1)

## 2015-05-17 LAB — POCT GLYCOSYLATED HEMOGLOBIN (HGB A1C): Hemoglobin A1C: 4.4

## 2015-05-17 MED ORDER — SERTRALINE HCL 100 MG PO TABS: 100.0000 mg | ORAL_TABLET | Freq: Every day | ORAL | Status: AC

## 2015-05-17 MED ORDER — TRAMADOL HCL 50 MG PO TABS: 50.0000 mg | ORAL_TABLET | Freq: Two times a day (BID) | ORAL | Status: DC | PRN

## 2015-05-17 MED ORDER — TRAZODONE HCL 50 MG PO TABS: 25.0000 mg | ORAL_TABLET | Freq: Every evening | ORAL | Status: DC | PRN

## 2015-05-17 MED FILL — traZODone HCL 50 MG TABS: 50 | 30 days supply | Qty: 30 | Fill #0

## 2015-05-17 MED FILL — SERTRALINE HCL 100 MG TAB: 100 | 30 days supply | Qty: 30 | Fill #0

## 2015-05-17 NOTE — Progress Notes (Signed)
Subjective:  Patient ID: Julie Rowland, female    DOB: March 14, 1971  Age: 45 y.o. MRN: [id number]  CC: Back Pain and Fatigue   HPI Julie Rowland  45 yo F with cirrhosis presents for f/u    1. Back pain: low back pain. Non radiating. Avoiding tylenol due to cirrhosis. Wants to know if she can take flexeril or any other pain medicine.   2. Seizure: no recurrent seizure. Complaint with keppra. Sleeping poorly. Compliant with lactulose 45 mL, 4 times daily. Has not established with neurology due to lack of insurance.   3. Alcoholic cirrhosis: has not established with GI due to lack of insurance. Not taking rifaximin due to cost and lack of insurance. No nausea or emesis. Minimal swelling. Compliant with lasix prn swelling and low salt. Minimal abdominal pain, usually epigastric. No fever or chills.   4. Fatigue; has anemia. Denies vomiting or blood in stool. Has fatigue to minimal activity.   Social History  Substance Use Topics  . Smoking status: Former Smoker -- 20 years    Types: Cigarettes    Quit date: 08/02/2014  . Smokeless tobacco: Never Used  . Alcohol Use: 0.0 oz/week    0 Standard drinks or equivalent per week     Comment: Has not had ETOH in 09/08/14   Outpatient Prescriptions Prior to Visit  Medication Sig Dispense Refill  . furosemide (LASIX) 40 MG tablet Take 1 tablet (40 mg total) by mouth daily. 30 tablet 3  . hydrOXYzine (ATARAX/VISTARIL) 25 MG tablet Take 1 tablet (25 mg total) by mouth 3 (three) times daily as needed for anxiety. 60 tablet 2  . lactulose (CHRONULAC) 10 GM/15ML solution Take 45 mLs (30 g total) by mouth 4 (four) times daily. 4185 mL 5  . levETIRAcetam (KEPPRA) 250 MG tablet Take 1 tablet (250 mg total) by mouth 2 (two) times daily. 60 tablet 0  . pantoprazole (PROTONIX) 40 MG tablet Take 1 tablet (40 mg total) by mouth 2 (two) times daily. 60 tablet 5  . sertraline (ZOLOFT) 50 MG tablet Take 1 tablet (50 mg total) by mouth daily. 30  tablet 5  . rifaximin (XIFAXAN) 550 MG TABS tablet Take 1 tablet (550 mg total) by mouth 3 (three) times daily. (Patient not taking: Reported on 05/17/2015) 90 tablet 0   No facility-administered medications prior to visit.    ROS Review of Systems  Constitutional: Positive for fatigue. Negative for fever and chills.  Eyes: Negative for visual disturbance.  Respiratory: Negative for shortness of breath.   Cardiovascular: Negative for chest pain and leg swelling (R ankle > L ankle ).  Gastrointestinal: Positive for abdominal pain. Negative for nausea, vomiting, diarrhea, blood in stool, abdominal distention, anal bleeding and rectal pain.  Musculoskeletal: Positive for back pain. Negative for arthralgias.  Skin: Negative for rash.  Allergic/Immunologic: Negative for immunocompromised state.  Hematological: Negative for adenopathy. Does not bruise/bleed easily.  Psychiatric/Behavioral: Positive for sleep disturbance, dysphoric mood and decreased concentration. Negative for suicidal ideas, hallucinations, behavioral problems, confusion, self-injury and agitation. The patient is nervous/anxious. The patient is not hyperactive.     Objective:  BP 133/70 mmHg  Pulse 75  Temp(Src) 98.4 F (36.9 C)  Resp 16  Wt 213 lb (96.616 kg)  SpO2 100%  BP/Weight 05/17/2015 04/01/2015 03/30/2015  Systolic BP 133 137 136  Diastolic BP 70 77 80  Wt. (Lbs) 213 214 210.32  BMI 31.44 31.59 -    Physical Exam  Constitutional: She is  oriented to person, place, and time. She appears well-developed and well-nourished. No distress.  HENT:  Head: Normocephalic and atraumatic.  Eyes: No scleral icterus.  Cardiovascular: Normal rate, regular rhythm, normal heart sounds and intact distal pulses.   Pulmonary/Chest: Effort normal and breath sounds normal.  Musculoskeletal: She exhibits edema (trace edema in R ankle ). She exhibits no tenderness.  Neurological: She is alert and oriented to person, place, and  time. She displays no tremor.  Negative asterixis   Skin: Skin is warm and dry. No rash noted.  Psychiatric: She has a normal mood and affect.   Lab Results  Component Value Date   HGBA1C 4.40 05/17/2015   Depression screen [facility] 2/9 05/17/2015 03/03/2015 03/03/2015  Decreased Interest 1 0 0  Down, Depressed, Hopeless 2 2 0  PHQ - 2 Score 3 2 0  Altered sleeping 3 3 -  Tired, decreased energy 3 2 -  Change in appetite 0 1 -  Feeling bad or failure about yourself  0 2 -  Trouble concentrating 2 0 -  Moving slowly or fidgety/restless 0 1 -  Suicidal thoughts 0 - -  PHQ-9 Score 11 11 -   GAD 7 : Generalized Anxiety Score 05/17/2015 03/03/2015  Nervous, Anxious, on Edge 3 1  Control/stop worrying 2 3  Worry too much - different things 2 2  Trouble relaxing 3 1  Restless 0 2  Easily annoyed or irritable 2 2  Afraid - awful might happen 0 2  Total GAD 7 Score 12 13       Assessment & Plan:   Julie Rowland was seen today for back pain and fatigue.  Diagnoses and all orders for this visit:  Healthcare maintenance -     POCT glycosylated hemoglobin (Hb A1C)  Alcoholic cirrhosis of liver without ascites (HCC) -     CBC -     COMPLETE METABOLIC PANEL WITH GFR  Increased ammonia level -     AMMONIA  Depression -     sertraline (ZOLOFT) 100 MG tablet; Take 1 tablet (100 mg total) by mouth daily. -     traZODone (DESYREL) 50 MG tablet; Take 0.5-1 tablets (25-50 mg total) by mouth at bedtime as needed for sleep.  Bilateral low back pain without sciatica -     traMADol (ULTRAM) 50 MG tablet; Take 1 tablet (50 mg total) by mouth every 12 (twelve) hours as needed.  Insomnia -     traZODone (DESYREL) 50 MG tablet; Take 0.5-1 tablets (25-50 mg total) by mouth at bedtime as needed for sleep.  Encephalopathy, hepatic (HCC) -     rifaximin (XIFAXAN) 550 MG TABS tablet; Take 1 tablet (550 mg total) by mouth 2 (two) times daily.   Follow-up: No Follow-up on file.   Julie Phi  MD

## 2015-05-17 NOTE — Progress Notes (Signed)
C/C Back pain, fatigue pain scale #10 Medication question  Medicine refills  No tobacco user  No suicidal thought in the past two weeks

## 2015-05-17 NOTE — Patient Instructions (Addendum)
Julie Rowland was seen today for back pain and fatigue.  Diagnoses and all orders for this visit:  Healthcare maintenance -     POCT glycosylated hemoglobin (Hb A1C)  Alcoholic cirrhosis of liver without ascites (HCC) -     CBC -     COMPLETE METABOLIC PANEL WITH GFR  Increased ammonia level -     AMMONIA  Depression -     sertraline (ZOLOFT) 100 MG tablet; Take 1 tablet (100 mg total) by mouth daily. -     traZODone (DESYREL) 50 MG tablet; Take 0.5-1 tablets (25-50 mg total) by mouth at bedtime as needed for sleep.  Bilateral low back pain without sciatica -     traMADol (ULTRAM) 50 MG tablet; Take 1 tablet (50 mg total) by mouth every 12 (twelve) hours as needed.   F/u in 6 weeks for depression and cirrhosis   Dr. Armen Pickup

## 2015-05-18 ENCOUNTER — Encounter: Payer: Self-pay | Admitting: Family Medicine

## 2015-05-18 LAB — CBC
HCT: 22.2 % — ABNORMAL LOW (ref 36.0–46.0)
Hemoglobin: 7.8 g/dL — ABNORMAL LOW (ref 12.0–15.0)
MCH: 33.1 pg (ref 26.0–34.0)
MCHC: 35.1 g/dL (ref 30.0–36.0)
MCV: 94.1 fL (ref 78.0–100.0)
MPV: 9.6 fL (ref 8.6–12.4)
PLATELETS: 92 10*3/uL — AB (ref 150–400)
RBC: 2.36 MIL/uL — AB (ref 3.87–5.11)
RDW: 16.3 % — ABNORMAL HIGH (ref 11.5–15.5)
WBC: 7.5 10*3/uL (ref 4.0–10.5)

## 2015-05-18 LAB — AMMONIA: Ammonia: 122 umol/L — ABNORMAL HIGH (ref 16–53)

## 2015-05-18 MED ORDER — RIFAXIMIN 550 MG PO TABS: 550.0000 mg | ORAL_TABLET | Freq: Two times a day (BID) | ORAL | Status: DC

## 2015-05-18 MED FILL — traMADol HCL 50 MG TABS: 50 | 30 days supply | Qty: 60 | Fill #0

## 2015-05-18 NOTE — Assessment & Plan Note (Signed)
Low back pain Add tramadol for prn use

## 2015-05-18 NOTE — Assessment & Plan Note (Signed)
Persistent depression Increase zoloft from 50 to 100 mg daily

## 2015-05-18 NOTE — Assessment & Plan Note (Signed)
Persistent insomnia with depression Ambien not recommended Trazodone ordered

## 2015-05-18 NOTE — Assessment & Plan Note (Signed)
At risk of recurrent hepatic encephalopathy with rising ammonia level Rifaximin ordered again to see if medication assistance available

## 2015-05-23 ENCOUNTER — Other Ambulatory Visit: Payer: Self-pay | Admitting: Family Medicine

## 2015-05-23 ENCOUNTER — Encounter: Payer: Self-pay | Admitting: Family Medicine

## 2015-05-23 ENCOUNTER — Ambulatory Visit: Payer: Medicaid Other | Attending: Family Medicine | Admitting: Family Medicine

## 2015-05-23 VITALS — BP 127/66 | HR 70 | Temp 98.0°F | Resp 16 | Ht 69.0 in | Wt 219.0 lb

## 2015-05-23 DIAGNOSIS — K7460 Unspecified cirrhosis of liver: Secondary | ICD-10-CM | POA: Insufficient documentation

## 2015-05-23 DIAGNOSIS — Z79899 Other long term (current) drug therapy: Secondary | ICD-10-CM | POA: Insufficient documentation

## 2015-05-23 DIAGNOSIS — R233 Spontaneous ecchymoses: Secondary | ICD-10-CM

## 2015-05-23 DIAGNOSIS — D696 Thrombocytopenia, unspecified: Secondary | ICD-10-CM | POA: Insufficient documentation

## 2015-05-23 DIAGNOSIS — Z87891 Personal history of nicotine dependence: Secondary | ICD-10-CM | POA: Insufficient documentation

## 2015-05-23 DIAGNOSIS — K7030 Alcoholic cirrhosis of liver without ascites: Secondary | ICD-10-CM

## 2015-05-23 DIAGNOSIS — R58 Hemorrhage, not elsewhere classified: Secondary | ICD-10-CM | POA: Diagnosis present

## 2015-05-23 DIAGNOSIS — D638 Anemia in other chronic diseases classified elsewhere: Secondary | ICD-10-CM

## 2015-05-23 LAB — POCT INR: INR: 1.7

## 2015-05-23 LAB — CBC
HEMATOCRIT: 21.3 % — AB (ref 36.0–46.0)
HEMOGLOBIN: 7.3 g/dL — AB (ref 12.0–15.0)
MCH: 32.2 pg (ref 26.0–34.0)
MCHC: 34.3 g/dL (ref 30.0–36.0)
MCV: 93.8 fL (ref 78.0–100.0)
MPV: 9.6 fL (ref 8.6–12.4)
Platelets: 80 10*3/uL — ABNORMAL LOW (ref 150–400)
RBC: 2.27 MIL/uL — AB (ref 3.87–5.11)
RDW: 15.3 % (ref 11.5–15.5)
WBC: 6.7 10*3/uL (ref 4.0–10.5)

## 2015-05-23 LAB — FOLATE: FOLATE: 2.3 ng/mL — AB (ref >5.4)

## 2015-05-23 LAB — VITAMIN B12

## 2015-05-23 MED FILL — ?LEVETIRACETAM 500 MG TABLE: 500 | 30 days supply | Qty: 30 | Fill #0

## 2015-05-23 MED FILL — LACTULOSE 10 GM/15 ML SOLN: 10 | 23 days supply | Qty: 4185 | Fill #1

## 2015-05-23 MED FILL — ?PANTOPRAZOLE SOD DR 40MG: 40 MG | 30 days supply | Qty: 60 | Fill #2

## 2015-05-23 MED FILL — ?HYDROXYZINE HCL 25 MG TAB: 25 | 20 days supply | Qty: 60 | Fill #2

## 2015-05-23 NOTE — Assessment & Plan Note (Addendum)
Cirrhosis Patient will likely need to be considered for transplant She is not able to follow up with outpatient  GI at the moment due to lack of insurance and lack of  discount  I discussed her case with the financial counselor.   Plan: Start back on lasix 40 mg daily due to weight gain   Please call your case-worker a medicaid and ask him/her to fax a letter stating that all documents have been received and your application is being processed.  Fax to [phone] Attn: Horald Chestnut

## 2015-05-23 NOTE — Patient Instructions (Addendum)
Julie Rowland was seen today for rash.  Diagnoses and all orders for this visit:  Ecchymoses, spontaneous -     INR  Anemia, chronic disease -     CBC -     Vitamin B12 -     Folate RBC   INR is 1.7 this is normal but high end of normal Checking stat labs Start back on lasix 40 mg daily due to weight gain   Please call your case-worker a medicaid and ask him/her to fax a letter stating that all documents have been received and your application is being processed.  Fax to [phone] Attn: Horald Chestnut  F/u with me in 4 weeks  Dr. Armen Pickup

## 2015-05-23 NOTE — Progress Notes (Signed)
Purple Rash on back and bottom No hx injury  No energy  No tobacco user  No suicidal thought in the past two week

## 2015-05-23 NOTE — Assessment & Plan Note (Addendum)
A; bruising in setting of known anemia of chronic disease and thrombocytopenia P: Repeat CBC today  Patient will be advised to go to ED for blood transfusion if CBC < 7 or platelets < 50K

## 2015-05-23 NOTE — Progress Notes (Signed)
Subjective:  Patient ID: Julie Rowland, female    DOB: 1970-07-27  Age: 45 y.o. MRN: [id number]  CC: No chief complaint on file.   HPI Julie Rowland  45 yo F with cirrhosis presents for f/u   1. Bruising: her mom noted purplish/blue marks on her back and bottom 2 days ago. Patient has been using a heating pad for back pain. She denies falls and trauma. She has known hepatitis with anemia and thrombocytopenia requiring blood transfusion in 03/2015. She is uninsured with medicaid pending. She is therefore currently unable to establish with local GI or follow up with her gastroenterologist at [facility]. She admits to fatigue. She denies blood in stool, nausea, vomiting and abdominal pain.   Social History  Substance Use Topics  . Smoking status: Former Smoker -- 20 years    Types: Cigarettes    Quit date: 08/02/2014  . Smokeless tobacco: Never Used  . Alcohol Use: 0.0 oz/week    0 Standard drinks or equivalent per week     Comment: Has not had ETOH in 09/08/14   Outpatient Prescriptions Prior to Visit  Medication Sig Dispense Refill  . furosemide (LASIX) 40 MG tablet Take 1 tablet (40 mg total) by mouth daily. 30 tablet 3  . hydrOXYzine (ATARAX/VISTARIL) 25 MG tablet Take 1 tablet (25 mg total) by mouth 3 (three) times daily as needed for anxiety. 60 tablet 2  . lactulose (CHRONULAC) 10 GM/15ML solution Take 45 mLs (30 g total) by mouth 4 (four) times daily. 4185 mL 5  . levETIRAcetam (KEPPRA) 250 MG tablet Take 1 tablet (250 mg total) by mouth 2 (two) times daily. 60 tablet 0  . pantoprazole (PROTONIX) 40 MG tablet Take 1 tablet (40 mg total) by mouth 2 (two) times daily. 60 tablet 5  . rifaximin (XIFAXAN) 550 MG TABS tablet Take 1 tablet (550 mg total) by mouth 2 (two) times daily. 60 tablet 0  . sertraline (ZOLOFT) 100 MG tablet Take 1 tablet (100 mg total) by mouth daily. 30 tablet 5  . traMADol (ULTRAM) 50 MG tablet Take 1 tablet (50 mg total) by mouth  every 12 (twelve) hours as needed. 60 tablet 0  . traZODone (DESYREL) 50 MG tablet Take 0.5-1 tablets (25-50 mg total) by mouth at bedtime as needed for sleep. 30 tablet 3   No facility-administered medications prior to visit.    ROS Review of Systems  Constitutional: Positive for fatigue. Negative for fever and chills.  Eyes: Negative for visual disturbance.  Respiratory: Negative for shortness of breath.   Cardiovascular: Positive for leg swelling (R ankle > L ankle ). Negative for chest pain.  Gastrointestinal: Negative for nausea, vomiting, abdominal pain, diarrhea, constipation, blood in stool, abdominal distention, anal bleeding and rectal pain.  Musculoskeletal: Positive for back pain. Negative for arthralgias.  Skin: Negative for rash.  Allergic/Immunologic: Negative for immunocompromised state.  Hematological: Negative for adenopathy. Bruises/bleeds easily.  Psychiatric/Behavioral: Positive for sleep disturbance, dysphoric mood and decreased concentration. Negative for suicidal ideas, hallucinations, behavioral problems, confusion, self-injury and agitation. The patient is nervous/anxious. The patient is not hyperactive.     Objective:  BP 127/66 mmHg  Pulse 70  Temp(Src) 98 F (36.7 C) (Oral)  Resp 16  Ht  (1.753 m)  Wt 219 lb (99.338 kg)  BMI 32.33 kg/m2  SpO2 100%  BP/Weight 05/23/2015 05/17/2015 04/01/2015  Systolic BP 127 133 137  Diastolic BP 66 70 77  Wt. (Lbs) 219 213 214  BMI 32.33 31.44 31.59    Physical Exam  Constitutional: She is oriented to person, place, and time. She appears well-developed and well-nourished. No distress.  HENT:  Head: Normocephalic and atraumatic.  Eyes: No scleral icterus.  Cardiovascular: Normal rate, regular rhythm, normal heart sounds and intact distal pulses.   Pulmonary/Chest: Effort normal and breath sounds normal.  Musculoskeletal: She exhibits edema (1+ edema ). She exhibits no tenderness.  Neurological: She is alert  and oriented to person, place, and time. She displays no tremor.  Negative asterixis   Skin: Skin is warm and dry. Ecchymosis noted. No rash noted.     Psychiatric: She has a normal mood and affect.   Lab Results  Component Value Date   WBC 7.5 05/17/2015   HGB 7.8* 05/17/2015   HCT 22.2* 05/17/2015   MCV 94.1 05/17/2015   PLT 92* 05/17/2015     Chemistry      Component Value Date/Time   NA 139 05/17/2015 1531   K 3.7 05/17/2015 1531   CL 108 05/17/2015 1531   CO2 22 05/17/2015 1531   BUN 14 05/17/2015 1531   CREATININE 2.47* 05/17/2015 1531   CREATININE 2.72* 03/30/2015 0640   CREATININE 0.89 12/18/2008 2030      Component Value Date/Time   CALCIUM 8.7 05/17/2015 1531   ALKPHOS 113 05/17/2015 1531   AST 66* 05/17/2015 1531   ALT 41* 05/17/2015 1531   BILITOT 4.3* 05/17/2015 1531     Lab Results  Component Value Date   INR 1.70 05/23/2015   INR 1.86* 03/29/2015   INR 1.92* 03/27/2015    Lab Results  Component Value Date   HGBA1C 4.40 05/17/2015   Depression screen PHQ 2/9 05/17/2015 03/03/2015 03/03/2015  Decreased Interest 1 0 0  Down, Depressed, Hopeless 2 2 0  PHQ - 2 Score 3 2 0  Altered sleeping 3 3 -  Tired, decreased energy 3 2 -  Change in appetite 0 1 -  Feeling bad or failure about yourself  0 2 -  Trouble concentrating 2 0 -  Moving slowly or fidgety/restless 0 1 -  Suicidal thoughts 0 - -  PHQ-9 Score 11 11 -   GAD 7 : Generalized Anxiety Score 05/17/2015 03/03/2015  Nervous, Anxious, on Edge 3 1  Control/stop worrying 2 3  Worry too much - different things 2 2  Trouble relaxing 3 1  Restless 0 2  Easily annoyed or irritable 2 2  Afraid - awful might happen 0 2  Total GAD 7 Score 12 13    Assessment & Plan:   There are no diagnoses linked to this encounter. Follow-up: No Follow-up on file.   Dessa Phi MD

## 2015-05-24 ENCOUNTER — Telehealth: Payer: Self-pay | Admitting: Family Medicine

## 2015-05-24 DIAGNOSIS — D638 Anemia in other chronic diseases classified elsewhere: Secondary | ICD-10-CM

## 2015-05-24 MED ORDER — FOLIC ACID 800 MCG PO TABS: 800.0000 ug | ORAL_TABLET | Freq: Every day | ORAL | Status: DC

## 2015-05-24 NOTE — Telephone Encounter (Signed)
Patient called  Hgb stable at 7.3  Platelets at 80 No need for transfusion Vit b12 is normal Folic acid is low Folic acid ordered to help with anemia

## 2015-05-24 NOTE — Telephone Encounter (Signed)
Called patient Verified name and DOB  Gave lab results  Hgb stable at 7.3  Platelets at 80  No need for transfusion  Vit b12 is normal  Folic acid is low  Folic acid ordered to help with anemia   I spoke with patient's mother regarding medicaid application verification. Patient is working on being approved for both medicaid and disability. This is under the same case #. The case # is on the fax that was sent yesterday.

## 2015-05-26 ENCOUNTER — Other Ambulatory Visit: Payer: Self-pay | Admitting: *Deleted

## 2015-05-26 DIAGNOSIS — K7290 Hepatic failure, unspecified without coma: Secondary | ICD-10-CM

## 2015-05-26 DIAGNOSIS — K7682 Hepatic encephalopathy: Secondary | ICD-10-CM

## 2015-05-26 MED ORDER — RIFAXIMIN 550 MG PO TABS: 550.0000 mg | ORAL_TABLET | Freq: Two times a day (BID) | ORAL | Status: DC

## 2015-06-06 ENCOUNTER — Other Ambulatory Visit: Payer: No Typology Code available for payment source

## 2015-06-10 ENCOUNTER — Ambulatory Visit: Payer: Medicaid Other | Attending: Family Medicine

## 2015-06-10 DIAGNOSIS — D638 Anemia in other chronic diseases classified elsewhere: Secondary | ICD-10-CM | POA: Diagnosis present

## 2015-06-10 DIAGNOSIS — N179 Acute kidney failure, unspecified: Secondary | ICD-10-CM

## 2015-06-10 DIAGNOSIS — K7030 Alcoholic cirrhosis of liver without ascites: Secondary | ICD-10-CM

## 2015-06-10 LAB — BASIC METABOLIC PANEL
BUN: 16 mg/dL (ref 7–25)
CALCIUM: 8.6 mg/dL (ref 8.6–10.2)
CO2: 21 mmol/L (ref 20–31)
CREATININE: 2.34 mg/dL — AB (ref 0.50–1.10)
Chloride: 104 mmol/L (ref 98–110)
Glucose, Bld: 92 mg/dL (ref 65–99)
Potassium: 3.1 mmol/L — ABNORMAL LOW (ref 3.5–5.3)
Sodium: 140 mmol/L (ref 135–146)

## 2015-06-10 LAB — CBC
HCT: 23.3 % — ABNORMAL LOW (ref 36.0–46.0)
Hemoglobin: 7.9 g/dL — ABNORMAL LOW (ref 12.0–15.0)
MCH: 31 pg (ref 26.0–34.0)
MCHC: 33.9 g/dL (ref 30.0–36.0)
MCV: 91.4 fL (ref 78.0–100.0)
MPV: 9 fL (ref 8.6–12.4)
PLATELETS: 78 10*3/uL — AB (ref 150–400)
RBC: 2.55 MIL/uL — AB (ref 3.87–5.11)
RDW: 15.1 % (ref 11.5–15.5)
WBC: 6.3 10*3/uL (ref 4.0–10.5)

## 2015-06-13 ENCOUNTER — Other Ambulatory Visit: Payer: Self-pay | Admitting: Family Medicine

## 2015-06-13 DIAGNOSIS — I878 Other specified disorders of veins: Secondary | ICD-10-CM

## 2015-06-13 DIAGNOSIS — K766 Portal hypertension: Secondary | ICD-10-CM

## 2015-06-13 MED ORDER — POTASSIUM CHLORIDE CRYS ER 20 MEQ PO TBCR: 40.0000 meq | EXTENDED_RELEASE_TABLET | Freq: Every day | ORAL | Status: DC

## 2015-06-13 MED ORDER — POTASSIUM CHLORIDE CRYS ER 20 MEQ PO TBCR: 20.0000 meq | EXTENDED_RELEASE_TABLET | Freq: Every day | ORAL | Status: DC

## 2015-06-13 MED FILL — POTASSIUM CL ER 20 MEQ TAB: 20 | 30 days supply | Qty: 30 | Fill #0

## 2015-06-14 ENCOUNTER — Encounter: Payer: Self-pay | Admitting: Clinical

## 2015-06-14 NOTE — Progress Notes (Signed)
Depression screen [facility] 2/9 05/17/2015 03/03/2015 03/03/2015 11/04/2014 10/04/2014  Decreased Interest 1 0 0 0 0  Down, Depressed, Hopeless 2 2 0 0 0  PHQ - 2 Score 3 2 0 0 0  Altered sleeping 3 3 - - -  Tired, decreased energy 3 2 - - -  Change in appetite 0 1 - - -  Feeling bad or failure about yourself  0 2 - - -  Trouble concentrating 2 0 - - -  Moving slowly or fidgety/restless 0 1 - - -  Suicidal thoughts 0 - - - -  PHQ-9 Score 11 11 - - -    GAD 7 : Generalized Anxiety Score 05/17/2015 03/03/2015  Nervous, Anxious, on Edge 3 1  Control/stop worrying 2 3  Worry too much - different things 2 2  Trouble relaxing 3 1  Restless 0 2  Easily annoyed or irritable 2 2  Afraid - awful might happen 0 2  Total GAD 7 Score 12 13

## 2015-06-15 ENCOUNTER — Telehealth: Payer: Self-pay | Admitting: *Deleted

## 2015-06-15 MED FILL — traZODone HCL 50 MG TABS: 50 | 30 days supply | Qty: 30 | Fill #1

## 2015-06-15 MED FILL — ?PANTOPRAZOLE SOD DR 40MG: 40 MG | 30 days supply | Qty: 60 | Fill #3

## 2015-06-15 MED FILL — LACTULOSE 10 GM/15 ML SOLN: 10 | 23 days supply | Qty: 4185 | Fill #2

## 2015-06-15 MED FILL — ?SERTRALINE HCL 100 MG TAB: 100 | 30 days supply | Qty: 30 | Fill #1

## 2015-06-15 MED FILL — ?LEVETIRACETAM 500 MG TABLE: 500 | 30 days supply | Qty: 30 | Fill #1

## 2015-06-15 NOTE — Telephone Encounter (Signed)
-----   Message from Dessa Phi, MD sent at 06/13/2015  1:08 PM EST ----- CBC stable Potassium a bit low, patient taking lasix most day, add potassium on days she takes lasix

## 2015-06-15 NOTE — Telephone Encounter (Signed)
LVM to return call.

## 2015-06-17 ENCOUNTER — Ambulatory Visit: Payer: Medicaid Other | Attending: Family Medicine | Admitting: Family Medicine

## 2015-06-17 ENCOUNTER — Encounter: Payer: Self-pay | Admitting: Family Medicine

## 2015-06-17 DIAGNOSIS — D696 Thrombocytopenia, unspecified: Secondary | ICD-10-CM

## 2015-06-17 DIAGNOSIS — M79605 Pain in left leg: Secondary | ICD-10-CM

## 2015-06-17 DIAGNOSIS — K766 Portal hypertension: Secondary | ICD-10-CM

## 2015-06-17 DIAGNOSIS — K7290 Hepatic failure, unspecified without coma: Secondary | ICD-10-CM

## 2015-06-17 DIAGNOSIS — D638 Anemia in other chronic diseases classified elsewhere: Secondary | ICD-10-CM | POA: Diagnosis not present

## 2015-06-17 DIAGNOSIS — K7460 Unspecified cirrhosis of liver: Secondary | ICD-10-CM | POA: Diagnosis not present

## 2015-06-17 DIAGNOSIS — Z87891 Personal history of nicotine dependence: Secondary | ICD-10-CM | POA: Insufficient documentation

## 2015-06-17 DIAGNOSIS — M79606 Pain in leg, unspecified: Secondary | ICD-10-CM | POA: Diagnosis present

## 2015-06-17 DIAGNOSIS — I878 Other specified disorders of veins: Secondary | ICD-10-CM | POA: Diagnosis not present

## 2015-06-17 DIAGNOSIS — Z79899 Other long term (current) drug therapy: Secondary | ICD-10-CM | POA: Insufficient documentation

## 2015-06-17 DIAGNOSIS — M79604 Pain in right leg: Secondary | ICD-10-CM

## 2015-06-17 DIAGNOSIS — K7682 Hepatic encephalopathy: Secondary | ICD-10-CM

## 2015-06-17 MED ORDER — SPIRONOLACTONE 25 MG PO TABS: 25.0000 mg | ORAL_TABLET | Freq: Every day | ORAL | Status: DC

## 2015-06-17 MED ORDER — POTASSIUM CHLORIDE CRYS ER 20 MEQ PO TBCR: 40.0000 meq | EXTENDED_RELEASE_TABLET | Freq: Every day | ORAL | Status: DC

## 2015-06-17 MED FILL — ?SPIRONOLACTONE 25 MG TABLE: 25 | 30 days supply | Qty: 30 | Fill #0

## 2015-06-17 NOTE — Assessment & Plan Note (Signed)
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

## 2015-06-17 NOTE — Assessment & Plan Note (Signed)
A: leg pains and weakness. Suspect worsening hypokalemia P:  Start potassium 40 mEq, 2 tablets daily.  Do not take lasix any longer, you were taking it as needed. Not taking lately. Aldactone will replace your as needed lasix.  You will not be on potassium long term, just for now to get the level up.

## 2015-06-17 NOTE — Telephone Encounter (Signed)
Pt has OV today  Results given  Pt verbalized understanding

## 2015-06-17 NOTE — Progress Notes (Signed)
C/C leg pain bilateral  Unable to walk, legs shaking and week  Pain scale #10 No tobacco user  No suicidal thoughts in the past two weeks

## 2015-06-17 NOTE — Patient Instructions (Addendum)
Marcelino DusterMichelle was seen today for leg pain.  Diagnoses and all orders for this visit:  Arthralgia of both lower legs  Bilateral leg pain -     COMPLETE METABOLIC PANEL WITH GFR  Thrombocytopenia (HCC) -     CBC  Anemia, chronic disease -     CBC  Encephalopathy, hepatic (HCC) -     AMMONIA  Venous stasis of both lower extremities -     spironolactone (ALDACTONE) 25 MG tablet; Take 1 tablet (25 mg total) by mouth daily. -     potassium chloride SA (K-DUR,KLOR-CON) 20 MEQ tablet; Take 2 tablets (40 mEq total) by mouth daily.  Portal hypertension (HCC) -     potassium chloride SA (K-DUR,KLOR-CON) 20 MEQ tablet; Take 2 tablets (40 mEq total) by mouth daily.    I suspect low potassium as source of muscle weakness and pains.  Start potassium 40 mEq, 2 tablets daily.  Do not take lasix any longer, you were taking it as needed. Not taking lately. Aldactone will replace your as needed lasix.  You will not be on potassium long term, just for now to get the level up.   Applying for via the patient assistance program xifaxan for your elevated ammonia levels. Continue lactulose.   F/u in 2 weeks for recheck and pap smear   Dr. Armen PickupFunches

## 2015-06-17 NOTE — Progress Notes (Signed)
Subjective:  Patient ID: Julie Rowland, female    DOB: 07/15/1970  Age: 45 y.o. MRN: [id number]  CC: Leg Pain   HPI Julie Rowland has cirrhosis she presents today with her mother.   1. Leg pain: legs painful, shaking and weak. Symptoms started about 2 weeks ago. No falls. Patient has not been taking potassium supplement as she just picked up the medicine today. She has not been taking prn lasix as her legs have not been swelling.     2. Hepatic encephalopathy: she has persistent fatigue and mental fogginess. She is taking lactulose. She has not yet started xifaxan but has been approved for it via patient assistance. It will arrive on 06/21/2015.   3. Petechia on back: persistent. Not significantly painful.   Social History  Substance Use Topics  . Smoking status: Former Smoker -- 20 years    Types: Cigarettes    Quit date: 08/02/2014  . Smokeless tobacco: Never Used  . Alcohol Use: 0.0 oz/week    0 Standard drinks or equivalent per week     Comment: Has not had ETOH in 09/08/14    Outpatient Prescriptions Prior to Visit  Medication Sig Dispense Refill  . folic acid (FOLVITE) 800 MCG tablet Take 1 tablet (800 mcg total) by mouth daily. 30 tablet 11  . furosemide (LASIX) 40 MG tablet Take 1 tablet (40 mg total) by mouth daily. 30 tablet 3  . hydrOXYzine (ATARAX/VISTARIL) 25 MG tablet Take 1 tablet (25 mg total) by mouth 3 (three) times daily as needed for anxiety. 60 tablet 2  . lactulose (CHRONULAC) 10 GM/15ML solution Take 45 mLs (30 g total) by mouth 4 (four) times daily. 4185 mL 5  . levETIRAcetam (KEPPRA) 500 MG tablet TAKE 1/2 TABLET BY MOUTH 2 TIMES DAILY 30 tablet 2  . pantoprazole (PROTONIX) 40 MG tablet Take 1 tablet (40 mg total) by mouth 2 (two) times daily. 60 tablet 5  . potassium chloride SA (K-DUR,KLOR-CON) 20 MEQ tablet Take 1 tablet (20 mEq total) by mouth daily. 30 tablet 2  . rifaximin (XIFAXAN) 550 MG TABS tablet Take 1 tablet (550 mg total) by  mouth 2 (two) times daily. 180 tablet 3  . sertraline (ZOLOFT) 100 MG tablet Take 1 tablet (100 mg total) by mouth daily. 30 tablet 5  . traMADol (ULTRAM) 50 MG tablet Take 1 tablet (50 mg total) by mouth every 12 (twelve) hours as needed. 60 tablet 0  . traZODone (DESYREL) 50 MG tablet Take 0.5-1 tablets (25-50 mg total) by mouth at bedtime as needed for sleep. 30 tablet 3   No facility-administered medications prior to visit.    ROS Review of Systems  Constitutional: Positive for fatigue. Negative for fever and chills.  Eyes: Negative for visual disturbance.  Respiratory: Negative for shortness of breath.   Cardiovascular: Negative for chest pain and leg swelling (when it does swell R >L).  Gastrointestinal: Negative for nausea, vomiting, abdominal pain, diarrhea, constipation, blood in stool, abdominal distention, anal bleeding and rectal pain.  Musculoskeletal: Positive for back pain. Negative for arthralgias.  Skin: Negative for rash.  Allergic/Immunologic: Negative for immunocompromised state.  Hematological: Negative for adenopathy. Bruises/bleeds easily.  Psychiatric/Behavioral: Positive for sleep disturbance, dysphoric mood and decreased concentration. Negative for suicidal ideas, hallucinations, behavioral problems, confusion, self-injury and agitation. The patient is nervous/anxious. The patient is not hyperactive.     Objective:  BP 122/69 mmHg  Pulse 88  Temp(Src) 97.8 F (36.6 C) (Oral)  Resp 16  Ht  (1.753 m)  Wt 205 lb (92.987 kg)  BMI 30.26 kg/m2  SpO2 100%  BP/Weight 06/17/2015 05/23/2015 05/17/2015  Systolic BP 122 127 133  Diastolic BP 69 66 70  Wt. (Lbs) 205 219 213  BMI 30.26 32.33 31.44    Physical Exam  Constitutional: She is oriented to person, place, and time. She appears well-developed and well-nourished. No distress.  HENT:  Head: Normocephalic and atraumatic.  Eyes: No scleral icterus.  Cardiovascular: Normal rate, regular rhythm, normal heart  sounds and intact distal pulses.   Pulmonary/Chest: Effort normal and breath sounds normal.  Musculoskeletal: She exhibits no edema or tenderness.  Neurological: She is alert and oriented to person, place, and time. She displays no tremor.  Negative asterixis   Skin: Skin is warm and dry. Ecchymosis noted. No rash noted.     Psychiatric: She has a normal mood and affect.    Lab Results  Component Value Date   HGBA1C 4.40 05/17/2015   Depression screen [facility] 2/9 06/17/2015 05/17/2015 03/03/2015 03/03/2015 11/04/2014  Decreased Interest 3 1 0 0 0  Down, Depressed, Hopeless 0 0  PHQ - 2 Score 0 0  Altered sleeping 0 3 3 - -  Tired, decreased energy - -  Change in appetite 3 0 1 - -  Feeling bad or failure about yourself  3 0 2 - -  Trouble concentrating 3 2 0 - -  Moving slowly or fidgety/restless 3 0 1 - -  Suicidal thoughts 0 0 - - -  PHQ-9 Score - -   GAD 7 : Generalized Anxiety Score 06/17/2015 05/17/2015 03/03/2015  Nervous, Anxious, on Edge Control/stop worrying Worry too much - different things Trouble relaxing Restless 0 0 2  Easily annoyed or irritable 0 2 2  Afraid - awful might happen 1 0 2  Total GAD 7 Score Assessment & Plan:   Julie Rowland was seen today for leg pain.  Diagnoses and all orders for this visit:  Bilateral leg pain -     COMPLETE METABOLIC PANEL WITH GFR  Thrombocytopenia (HCC) -     CBC  Anemia, chronic disease -     CBC  Encephalopathy, hepatic (HCC) -     AMMONIA  Venous stasis of both lower extremities -     spironolactone (ALDACTONE) 25 MG tablet; Take 1 tablet (25 mg total) by mouth daily. -     potassium chloride SA (K-DUR,KLOR-CON) 20 MEQ tablet; Take 2 tablets (40 mEq total) by mouth daily.  Portal hypertension (HCC) -     potassium chloride SA (K-DUR,KLOR-CON) 20 MEQ tablet; Take 2 tablets (40 mEq total) by mouth daily.   Follow-up: No Follow-up on file.    Dessa Phi MD

## 2015-06-18 LAB — COMPLETE METABOLIC PANEL WITH GFR
ALT: 169 U/L — ABNORMAL HIGH (ref 6–29)
AST: 321 U/L — ABNORMAL HIGH (ref 10–35)
Albumin: 2.7 g/dL — ABNORMAL LOW (ref 3.6–5.1)
Alkaline Phosphatase: 93 U/L (ref 33–115)
BILIRUBIN TOTAL: 3.6 mg/dL — AB (ref 0.2–1.2)
BUN: 15 mg/dL (ref 7–25)
CO2: 24 mmol/L (ref 20–31)
Calcium: 8.7 mg/dL (ref 8.6–10.2)
Chloride: 105 mmol/L (ref 98–110)
Creat: 2.14 mg/dL — ABNORMAL HIGH (ref 0.50–1.10)
GFR, EST AFRICAN AMERICAN: 31 mL/min — AB (ref >=60)
GFR, EST NON AFRICAN AMERICAN: 27 mL/min — AB (ref >=60)
Glucose, Bld: 94 mg/dL (ref 65–99)
POTASSIUM: 3 mmol/L — AB (ref 3.5–5.3)
SODIUM: 138 mmol/L (ref 135–146)
TOTAL PROTEIN: 7 g/dL (ref 6.1–8.1)

## 2015-06-18 LAB — CBC
HCT: 24.1 % — ABNORMAL LOW (ref 36.0–46.0)
HEMOGLOBIN: 8 g/dL — AB (ref 12.0–15.0)
MCH: 29.7 pg (ref 26.0–34.0)
MCHC: 33.2 g/dL (ref 30.0–36.0)
MCV: 89.6 fL (ref 78.0–100.0)
MPV: 10 fL (ref 8.6–12.4)
PLATELETS: 86 10*3/uL — AB (ref 150–400)
RBC: 2.69 MIL/uL — AB (ref 3.87–5.11)
RDW: 15.1 % (ref 11.5–15.5)
WBC: 6.4 10*3/uL (ref 4.0–10.5)

## 2015-06-18 LAB — AMMONIA: AMMONIA: 123 umol/L — AB (ref 16–53)

## 2015-06-20 ENCOUNTER — Other Ambulatory Visit: Payer: Self-pay | Admitting: Family Medicine

## 2015-06-20 ENCOUNTER — Telehealth: Payer: Self-pay | Admitting: Family Medicine

## 2015-06-20 MED FILL — ?HYDROXYZINE HCL 25 MG TAB: 25 | 30 days supply | Qty: 90 | Fill #0

## 2015-06-20 NOTE — Telephone Encounter (Signed)
Called patient Left VM requesting call back for lab results Also advised patient sign up for mychart for lab results Finally, reminded patient to pick up new medication that is arriving today.

## 2015-06-21 MED FILL — $XIFAXAN 550 MG TABLET: 550 | 30 days supply | Qty: 60 | Fill #0

## 2015-06-22 ENCOUNTER — Encounter: Payer: Self-pay | Admitting: Clinical

## 2015-06-22 NOTE — Progress Notes (Signed)
[facility] 2/9 06/17/2015 05/17/2015 03/03/2015 03/03/2015 11/04/2014  Decreased Interest 3 1 0 0 0  Down, Depressed, Hopeless 3 2 2  0 0  PHQ - 2 Score 6 3 2  0 0  Altered sleeping 0 3 3 - -  Tired, decreased energy 3 3 2  - -  Change in appetite 3 0 1 - -  Feeling bad or failure about yourself  3 0 2 - -  Trouble concentrating 3 2 0 - -  Moving slowly or fidgety/restless 3 0 1 - -  Suicidal thoughts 0 0 - - -  PHQ-9 Score 21 11 11  - -    GAD 7 : Generalized Anxiety Score 06/17/2015 05/17/2015 03/03/2015  Nervous, Anxious, on Edge 3 3 1   Control/stop worrying 3 2 3   Worry too much - different things 3 2 2   Trouble relaxing 3 3 1   Restless 0 0 2  Easily annoyed or irritable 0 2 2  Afraid - awful might happen 1 0 2  Total GAD 7 Score 13 12 13

## 2015-07-01 ENCOUNTER — Ambulatory Visit: Payer: No Typology Code available for payment source | Admitting: Family Medicine

## 2015-07-04 ENCOUNTER — Ambulatory Visit: Payer: No Typology Code available for payment source | Attending: Family Medicine

## 2015-07-04 ENCOUNTER — Other Ambulatory Visit: Payer: Self-pay | Admitting: Family Medicine

## 2015-07-04 ENCOUNTER — Ambulatory Visit: Payer: No Typology Code available for payment source | Admitting: Nurse Practitioner

## 2015-07-04 DIAGNOSIS — R7989 Other specified abnormal findings of blood chemistry: Secondary | ICD-10-CM

## 2015-07-04 DIAGNOSIS — D638 Anemia in other chronic diseases classified elsewhere: Secondary | ICD-10-CM

## 2015-07-04 LAB — BASIC METABOLIC PANEL
BUN: 15 mg/dL (ref 7–25)
CALCIUM: 8.4 mg/dL — AB (ref 8.6–10.2)
CO2: 20 mmol/L (ref 20–31)
CREATININE: 2.47 mg/dL — AB (ref 0.50–1.10)
Chloride: 107 mmol/L (ref 98–110)
Glucose, Bld: 101 mg/dL — ABNORMAL HIGH (ref 65–99)
Potassium: 3.5 mmol/L (ref 3.5–5.3)
Sodium: 137 mmol/L (ref 135–146)

## 2015-07-04 LAB — CBC
HCT: 23.7 % — ABNORMAL LOW (ref 36.0–46.0)
Hemoglobin: 8.1 g/dL — ABNORMAL LOW (ref 12.0–15.0)
MCH: 30 pg (ref 26.0–34.0)
MCHC: 34.2 g/dL (ref 30.0–36.0)
MCV: 87.8 fL (ref 78.0–100.0)
PLATELETS: 82 10*3/uL — AB (ref 150–400)
RBC: 2.7 MIL/uL — ABNORMAL LOW (ref 3.87–5.11)
RDW: 17.1 % — AB (ref 11.5–15.5)
WBC: 5.3 10*3/uL (ref 4.0–10.5)

## 2015-07-05 ENCOUNTER — Telehealth: Payer: Self-pay | Admitting: *Deleted

## 2015-07-05 LAB — AMMONIA: AMMONIA: 118 umol/L — AB (ref 16–53)

## 2015-07-05 NOTE — Telephone Encounter (Signed)
-----   Message from Dessa PhiJosalyn Funches, MD sent at 07/05/2015  8:40 AM EDT ----- All labs stable. Slight improvement in ammonia level

## 2015-07-05 NOTE — Telephone Encounter (Signed)
LVM to return call.

## 2015-07-06 NOTE — Telephone Encounter (Signed)
Pt returned call Date of birth verified by pt  Results given  Labs stable, Slight improvement in ammonia level Pt verbalized understanding

## 2015-07-18 ENCOUNTER — Encounter: Payer: Self-pay | Admitting: Family Medicine

## 2015-07-18 ENCOUNTER — Encounter: Payer: Self-pay | Admitting: Clinical

## 2015-07-18 ENCOUNTER — Ambulatory Visit: Payer: Medicaid Other | Attending: Family Medicine | Admitting: Family Medicine

## 2015-07-18 VITALS — BP 130/70 | HR 72 | Temp 98.1°F | Resp 16 | Ht 69.0 in | Wt 202.0 lb

## 2015-07-18 DIAGNOSIS — K7030 Alcoholic cirrhosis of liver without ascites: Secondary | ICD-10-CM | POA: Diagnosis not present

## 2015-07-18 DIAGNOSIS — R1310 Dysphagia, unspecified: Secondary | ICD-10-CM | POA: Insufficient documentation

## 2015-07-18 DIAGNOSIS — I8500 Esophageal varices without bleeding: Secondary | ICD-10-CM

## 2015-07-18 DIAGNOSIS — K759 Inflammatory liver disease, unspecified: Secondary | ICD-10-CM | POA: Insufficient documentation

## 2015-07-18 DIAGNOSIS — R21 Rash and other nonspecific skin eruption: Secondary | ICD-10-CM | POA: Insufficient documentation

## 2015-07-18 DIAGNOSIS — F329 Major depressive disorder, single episode, unspecified: Secondary | ICD-10-CM

## 2015-07-18 DIAGNOSIS — G4700 Insomnia, unspecified: Secondary | ICD-10-CM | POA: Diagnosis not present

## 2015-07-18 DIAGNOSIS — Z79899 Other long term (current) drug therapy: Secondary | ICD-10-CM | POA: Diagnosis not present

## 2015-07-18 DIAGNOSIS — R7989 Other specified abnormal findings of blood chemistry: Secondary | ICD-10-CM

## 2015-07-18 DIAGNOSIS — Z87891 Personal history of nicotine dependence: Secondary | ICD-10-CM | POA: Insufficient documentation

## 2015-07-18 DIAGNOSIS — F32A Depression, unspecified: Secondary | ICD-10-CM

## 2015-07-18 LAB — CBC
HEMATOCRIT: 22.2 % — AB (ref 35.0–45.0)
Hemoglobin: 7.8 g/dL — ABNORMAL LOW (ref 11.7–15.5)
MCH: 29.3 pg (ref 27.0–33.0)
MCHC: 35.1 g/dL (ref 32.0–36.0)
MCV: 83.5 fL (ref 80.0–100.0)
Platelets: 70 10*3/uL — ABNORMAL LOW (ref 140–400)
RBC: 2.66 MIL/uL — AB (ref 3.80–5.10)
RDW: 18.5 % — AB (ref 11.0–15.0)
WBC: 7.1 10*3/uL (ref 3.8–10.8)

## 2015-07-18 LAB — COMPLETE METABOLIC PANEL WITH GFR
ALT: 36 U/L — ABNORMAL HIGH (ref 6–29)
AST: 64 U/L — ABNORMAL HIGH (ref 10–35)
Albumin: 2.7 g/dL — ABNORMAL LOW (ref 3.6–5.1)
Alkaline Phosphatase: 94 U/L (ref 33–115)
BUN: 13 mg/dL (ref 7–25)
CALCIUM: 8.6 mg/dL (ref 8.6–10.2)
CHLORIDE: 107 mmol/L (ref 98–110)
CO2: 20 mmol/L (ref 20–31)
CREATININE: 2.1 mg/dL — AB (ref 0.50–1.10)
GFR, EST AFRICAN AMERICAN: 32 mL/min — AB (ref >=60)
GFR, EST NON AFRICAN AMERICAN: 28 mL/min — AB (ref >=60)
Glucose, Bld: 104 mg/dL — ABNORMAL HIGH (ref 65–99)
POTASSIUM: 3.7 mmol/L (ref 3.5–5.3)
Sodium: 138 mmol/L (ref 135–146)
Total Bilirubin: 4.5 mg/dL — ABNORMAL HIGH (ref 0.2–1.2)
Total Protein: 6.8 g/dL (ref 6.1–8.1)

## 2015-07-18 MED ORDER — SUCRALFATE 1 GM/10ML PO SUSP: 1.0000 g | Freq: Two times a day (BID) | ORAL | Status: DC

## 2015-07-18 MED ORDER — TRAZODONE HCL 50 MG PO TABS: 25.0000 mg | ORAL_TABLET | Freq: Every evening | ORAL | Status: DC | PRN

## 2015-07-18 MED FILL — LACTULOSE 10 GM/15 ML SOLN: 10 | 23 days supply | Qty: 4257 | Fill #3

## 2015-07-18 MED FILL — ?LEVETIRACETAM 500 MG TABLE: 500 | 30 days supply | Qty: 30 | Fill #2

## 2015-07-18 MED FILL — ?HYDROXYZINE HCL 25 MG TAB: 25 | 30 days supply | Qty: 90 | Fill #1

## 2015-07-18 MED FILL — $XIFAXAN 550 MG TABLET: 550 | 30 days supply | Qty: 90 | Fill #0

## 2015-07-18 MED FILL — ?SPIRONOLACTONE 25 MG TABLE: 25 | 30 days supply | Qty: 30 | Fill #1

## 2015-07-18 MED FILL — traZODone HCL 50 MG TABS: 50 | 30 days supply | Qty: 30 | Fill #2

## 2015-07-18 MED FILL — ?SERTRALINE HCL 100 MG TAB: 100 | 30 days supply | Qty: 30 | Fill #2

## 2015-07-18 MED FILL — ?PANTOPRAZOLE SOD DR 40MG: 40 MG | 30 days supply | Qty: 60 | Fill #4

## 2015-07-18 NOTE — Progress Notes (Signed)
Subjective:  Patient ID: Julie Rowland, female    DOB: July 30, 1970  Age: 45 y.o. MRN: [id number]  CC: No chief complaint on file.   HPI Julie Rowland has hepatitis and cirrhosis presumably ETOH induced,  she presents today with her mother. Since last OV her disability application has been denied. She is still waiting for a decision regarding medicaid.   1. Cirrhosis with hepatic encephalopathy: she denies ETOH.Sshe has started xifaxan. She endorses improvement in her mental fogginess. She still has fatigue. No tremor. No recurrent seizure activity. She still has not followed up with GI due to lack of insurance/orange card.   2. Esophageal varices: she is having more trouble swallowing her pills. No chest pain. She has RUQ and epigastric pain. No nausea, emesis or blood in stool. She is not taking carafate.   3. Depression: taking zoloft. Not taking trazodone. Depressed about her health, inability to visit her children in [place], and loss of independence.   4. Skin rash: she has hyperpigmented macules between her thighs on under her abdomen. Non tender. No itching.   Social History  Substance Use Topics  . Smoking status: Former Smoker -- 20 years    Types: Cigarettes    Quit date: 08/02/2014  . Smokeless tobacco: Never Used  . Alcohol Use: 0.0 oz/week    0 Standard drinks or equivalent per week     Comment: Has not had ETOH in 09/08/14    Outpatient Prescriptions Prior to Visit  Medication Sig Dispense Refill  . folic acid (FOLVITE) 800 MCG tablet Take 1 tablet (800 mcg total) by mouth daily. 30 tablet 11  . hydrOXYzine (ATARAX/VISTARIL) 25 MG tablet TAKE 1 TABLET BY MOUTH 3 TIMES DAILY AS NEEDED FOR ANXIETY. 90 tablet 2  . lactulose (CHRONULAC) 10 GM/15ML solution Take 45 mLs (30 g total) by mouth 4 (four) times daily. 4185 mL 5  . levETIRAcetam (KEPPRA) 500 MG tablet TAKE 1/2 TABLET BY MOUTH 2 TIMES DAILY 30 tablet 2  . pantoprazole (PROTONIX) 40 MG tablet Take 1  tablet (40 mg total) by mouth 2 (two) times daily. 60 tablet 5  . potassium chloride SA (K-DUR,KLOR-CON) 20 MEQ tablet Take 2 tablets (40 mEq total) by mouth daily. 30 tablet 0  . rifaximin (XIFAXAN) 550 MG TABS tablet Take 1 tablet (550 mg total) by mouth 2 (two) times daily. 180 tablet 3  . sertraline (ZOLOFT) 100 MG tablet Take 1 tablet (100 mg total) by mouth daily. 30 tablet 5  . spironolactone (ALDACTONE) 25 MG tablet Take 1 tablet (25 mg total) by mouth daily. 30 tablet 3  . traMADol (ULTRAM) 50 MG tablet Take 1 tablet (50 mg total) by mouth every 12 (twelve) hours as needed. 60 tablet 0  . traZODone (DESYREL) 50 MG tablet Take 0.5-1 tablets (25-50 mg total) by mouth at bedtime as needed for sleep. 30 tablet 3   No facility-administered medications prior to visit.    ROS Review of Systems  Constitutional: Positive for fatigue. Negative for fever and chills.  HENT: Positive for trouble swallowing.   Eyes: Negative for visual disturbance.  Respiratory: Negative for shortness of breath.   Cardiovascular: Negative for chest pain and leg swelling (when it does swell R >L).  Gastrointestinal: Positive for abdominal pain. Negative for nausea, vomiting, diarrhea, constipation, blood in stool, abdominal distention, anal bleeding and rectal pain.  Musculoskeletal: Positive for back pain. Negative for arthralgias.  Skin: Negative for rash.  Allergic/Immunologic: Negative for immunocompromised state.  Hematological: Negative for adenopathy. Bruises/bleeds easily.  Psychiatric/Behavioral: Positive for sleep disturbance, dysphoric mood and decreased concentration. Negative for suicidal ideas, hallucinations, behavioral problems, confusion, self-injury and agitation. The patient is nervous/anxious. The patient is not hyperactive.     Objective:  BP 130/70 mmHg  Pulse 72  Temp(Src) 98.1 F (36.7 C) (Oral)  Resp 16  Ht 5\' 9"  (1.753 m)  Wt 202 lb (91.627 kg)  BMI 29.82 kg/m2  SpO2  97%  BP/Weight 07/18/2015 06/17/2015 05/23/2015  Systolic BP 130 122 127  Diastolic BP 70 69 66  Wt. (Lbs) 202 205 219  BMI 29.82 30.26 32.33    Physical Exam  Constitutional: She is oriented to person, place, and time. She appears well-developed and well-nourished. No distress.  HENT:  Head: Normocephalic and atraumatic.  Eyes: No scleral icterus.  Cardiovascular: Normal rate, regular rhythm, normal heart sounds and intact distal pulses.   Pulmonary/Chest: Effort normal and breath sounds normal.  Abdominal: There is hepatomegaly. There is tenderness in the right upper quadrant and epigastric area. There is no rebound, no guarding and no CVA tenderness.  Musculoskeletal: She exhibits no edema or tenderness.  Neurological: She is alert and oriented to person, place, and time. She displays no tremor.  Negative asterixis   Skin: Skin is warm and dry. Ecchymosis noted. No rash noted.     Psychiatric: She has a normal mood and affect.    Lab Results  Component Value Date   HGBA1C 4.40 05/17/2015   [facility] 07/18/2015 06/17/2015 05/17/2015 03/03/2015 03/03/2015  Decreased Interest 3 3 1  0 0  Down, Depressed, Hopeless 3 3 2 2  0  PHQ - 2 Score 6 6 3 2  0  Altered sleeping 2 0 3 3 -  Tired, decreased energy 3 3 3 2  -  Change in appetite 3 3 0 1 -  Feeling bad or failure about yourself  2 3 0 2 -  Trouble concentrating 3 3 2  0 -  Moving slowly or fidgety/restless 3 3 0 1 -  Suicidal thoughts 0 0 0 - -  PHQ-9 Score 22 21 11 11  -   GAD 7 : Generalized Anxiety Score 07/18/2015 06/17/2015 05/17/2015 03/03/2015  Nervous, Anxious, on Edge 3 3 3 1   Control/stop worrying 3 3 2 3   Worry too much - different things 3 3 2 2   Trouble relaxing 3 3 3 1   Restless 3 0 0 2  Easily annoyed or irritable 3 0 2 2  Afraid - awful might happen 0 1 0 2  Total GAD 7 Score 18 13 12 13      Assessment & Plan:   Marcelino DusterMichelle was seen today for follow-up.  Diagnoses and all orders for this  visit:  Increased ammonia level -     Ammonia  Esophageal varices determined by endoscopy (HCC) -     sucralfate (CARAFATE) 1 GM/10ML suspension; Take 10 mLs (1 g total) by mouth 2 (two) times daily.  Depression -     traZODone (DESYREL) 50 MG tablet; Take 0.5-1 tablets (25-50 mg total) by mouth at bedtime as needed for sleep.  Insomnia -     traZODone (DESYREL) 50 MG tablet; Take 0.5-1 tablets (25-50 mg total) by mouth at bedtime as needed for sleep.  Alcoholic cirrhosis of liver without ascites (HCC) -     COMPLETE METABOLIC PANEL WITH GFR -     CBC  Hepatitis -     COMPLETE METABOLIC PANEL WITH GFR -  Amb Referral to Hepatology   Follow-up: No Follow-up on file.   Dessa Phi MD

## 2015-07-18 NOTE — Progress Notes (Signed)
F/U Leg, no energy  Depression  Pain scale # 4 abdominal pain No tobacco user  No suicidal thoughts in the past two weeks

## 2015-07-18 NOTE — Assessment & Plan Note (Signed)
A: patient with cirrhosis complicated by esophageal varices, portal HTN, elevated ammonia causing encephalopathy, anemia and thrombocytopenia. She needs to f/u with GI and see a hepatologist. She is in a holding pattern as she does not have insurance (private or medicaid) she also does not have the [place] discount.  P: Add back carafate Referred to hepatology

## 2015-07-18 NOTE — Assessment & Plan Note (Signed)
A: depression with cirrhosis P: Continue zoloft,100 mg is max recommended  Restart trazodone 50-100 mg at night

## 2015-07-18 NOTE — Patient Instructions (Addendum)
Julie Rowland was seen today for follow-up.  Diagnoses and all orders for this visit:  Increased ammonia level -     Ammonia  Esophageal varices determined by endoscopy (HCC) -     sucralfate (CARAFATE) 1 GM/10ML suspension; Take 10 mLs (1 g total) by mouth 2 (two) times daily.  Depression -     traZODone (DESYREL) 50 MG tablet; Take 0.5-1 tablets (25-50 mg total) by mouth at bedtime as needed for sleep.  Insomnia -     traZODone (DESYREL) 50 MG tablet; Take 0.5-1 tablets (25-50 mg total) by mouth at bedtime as needed for sleep.  Alcoholic cirrhosis of liver without ascites (HCC) -     COMPLETE METABOLIC PANEL WITH GFR -     CBC  Hepatitis -     COMPLETE METABOLIC PANEL WITH GFR -     Amb Referral to Hepatology   F/u in 4 weeks   Dr. Armen PickupFunches

## 2015-07-18 NOTE — Progress Notes (Signed)
[facility]2015 06/17/2015 05/17/2015 03/03/2015 03/03/2015  Decreased Interest 3 3 1  0 0  Down, Depressed, Hopeless 3 3 2 2  0  PHQ - 2 Score 6 6 3 2  0  Altered sleeping 2 0 3 3 -  Tired, decreased energy 3 3 3 2  -  Change in appetite 3 3 0 1 -  Feeling bad or failure about yourself  2 3 0 2 -  Trouble concentrating 3 3 2  0 -  Moving slowly or fidgety/restless 3 3 0 1 -  Suicidal thoughts 0 0 0 - -  PHQ-9 Score 22 21 11 11  -    GAD 7 : Generalized Anxiety Score 07/18/2015 06/17/2015 05/17/2015 03/03/2015  Nervous, Anxious, on Edge 3 3 3 1   Control/stop worrying 3 3 2 3   Worry too much - different things 3 3 2 2   Trouble relaxing 3 3 3 1   Restless 3 0 0 2  Easily annoyed or irritable 3 0 2 2  Afraid - awful might happen 0 1 0 2  Total GAD 7 Score 18 13 12  13

## 2015-07-19 ENCOUNTER — Telehealth: Payer: Self-pay | Admitting: Family Medicine

## 2015-07-19 ENCOUNTER — Encounter: Payer: Self-pay | Admitting: Family Medicine

## 2015-07-19 LAB — AMMONIA: Ammonia: 110 umol/L — ABNORMAL HIGH (ref 16–53)

## 2015-07-19 NOTE — Telephone Encounter (Signed)
Called patient's mom.   Reviewed patient labs from yesterday  Ammonia is slightly improved to 110 down from 118 Liver enzymes are still elevated but improved from last check Hgb and platelets are lower   Also reviewed her labs from her hospitalization in December 2016 which were notable for + AMA, and electrophoresis findings of elevated total globulin, elevated beta globulin of 2.2 and elevated M-spoke of 0.7%  Also informed her that I  Called to [facility] to discuss recent labs and hospital labs with Dr. [name] and or NP. Gill. I was informed that Dr. [name] is out of the office for the next week.  My concerns 1. Patient has been unable to f/u since her hospitalization due to lack of orange card and lack of insurance coverage. She has applied for and been denied disability. She is applying for medicaid and awaiting decision. In the meantime she does not have [employer] [employer]. She had it but it expired on 02/23/2015.   2. Her labs suggest a possible autoimmune hepatitis. Patient denies ETOH with her last drink being 12/06/2014. I have referred her to a hepatologist but given her lack of insurance I fear she will not be seen. Perhaps she will qualify for patient assistance at one of the university hospital [facility].     Her mother request that I write a letter with her diagnosis and her prognosis that can be faxed to her lawyer. I am happy to write the letter and have it faxed but michele must come in to sign a release of medical information.  She will come in tomorrow to sign the release and leave the contact information for her lawyer.

## 2015-07-19 NOTE — Telephone Encounter (Signed)
Reviewed patient labs from yesterday  Ammonia is slightly improved to 110 down from 118 Liver enzymes are still elevated but improved from last check Hgb and platelets are lower   Also reviewed her labs from her hospitalization in December 2016 which were notable for + AMA, and electrophoresis findings of elevated total globulin, elevated beta globulin of 2.2 and elevated M-spoke of 0.7%  Called to [facility] to discuss recent labs and hospital labs with Dr. [name] and or NP. Gill. I was informed that Dr. [name] is out of the office for the next week.  My concerns 1. Patient has been unable to f/u since her hospitalization due to lack of orange card and lack of insurance coverage. She has applied for and been denied disability. She is applying for medicaid and awaiting decision. In the meantime she does not have [facility] [employer] [employer] [facility]. She had it but it expired on 02/23/2015.   2. Her labs suggest a possible autoimmune hepatitis. Patient denies ETOH with her last drink being 12/06/2014. I have referred her to a hepatologist but given her lack of insurance I fear she will not be seen. Perhaps she will qualify for patient assistance at one of the university hospital systems.    Called patient left VM requesting a call back to discuss labs and my thoughts.

## 2015-07-19 NOTE — Telephone Encounter (Signed)
Patient's mother returning PCP's call...please followup

## 2015-07-20 NOTE — Telephone Encounter (Signed)
I spoke with Dr. Armen PickupFunches and the patient is scheduled to see Dr. [name] 4/26 at 10:30am.  I tried to call the patient, no answer, lmom.

## 2015-07-21 NOTE — Telephone Encounter (Signed)
Pt called office back and is aware of apportionment date and time

## 2015-07-27 NOTE — Telephone Encounter (Signed)
I WOULD BE GLAD TO REVIEW LABS AND ASSESS WHETHER PT HAS AIH, BUT CANNOT FIND THE  SEROLOGIES IN THE SYSTEM. PLEASE FAX YOUR RESULTS TO [phone]. PT IS MOST LIKELY A CLOSET ETOHIC. PRIOR TO HER EGD IN NOV 2016 SHE ADMITTED TO DRINKING ETOH. I WOULD NOT START STEROIDS DUE TO RISK OF INFECTION AND ONLY MODERATELY ELEVATED BILIRUBIN AND AST/ALT(APR 3 , 2017). THE RISK OF INFECTION/DEATH ARE SIGNIFICANT. SHE IS CHILD PUGH C(LIFE EXPECTANCY 1-3 YEARS) WITH MELD SCORE 25(3 MO MORTALITY ~20%). IF SHE WERE A MORE IDEAL CANDIDATE SHE COULD BE EVALUATED FOR A LIVER TRANSPLANT.THREE REASONS WHY SHE IS NOT AN IDEAL CANDIDATE FOR A LIVER TRANSPLANT-1. SHE HAS FAILED TO ADHERE TO HER PLAN OF CARE ON MULTIPLE OCCASIONA-STOP DRINKING ETOH/KEEP OPVs, 2. I SUSPECT AS WELL SHE HAS SIGNIFICANT MENTAL HEALTH ISSUES THAT WOULD PREVENT HER FROM BEING A CANDIDATE, AND 3. CURRENTLY SHE IS UNINSURED.  OUR OFFICE DOES NOT TURN PT'S AWAY OR DENY THEM APPTs EVEN WHEN THEY ARE UNINSURED. IN SPITE OF THIS FACT, SHE HAS NOT BEEN SEEN IN 6 MOS. WE WOULD BE MORE THAN HAPPY TO SEE HER IN FOLLOW UP. WE CAN INITIATE PAPERWORK FOR PT ASSITANCE FOR XIFAXAN. SHE HAS DECOMPENSATED LIVER DISEASE AND HER PROGNOSIS IS POOR. IN SEP 2016 SHE WEIGHED 260 LBS(BMI 38) AND NOW SHE WEIGHS 206 LBS(BMI 29).

## 2015-07-28 NOTE — Telephone Encounter (Signed)
Patient had several appointments that were scheduled, however the patient cancelled them for various personal reasons.  She is scheduled to see Dr. [name] 08/10/15 at 10:30am

## 2015-07-28 NOTE — Telephone Encounter (Addendum)
REVIEWED-Serologies reviewed. May consider low dose Prednisone 5 mg daily to see if we can recover viable liver tissue & synthetic function. OPV APR 26 @1030AM .

## 2015-07-28 NOTE — Telephone Encounter (Signed)
Dr. [name],  Thank you for seeing Julie Rowland in follow up, and for your thoughts and recommendations.   Regarding her lack of follow up, we were all under the impression that she could not been seen in follow up until her [facility] [employer] assistance was renewed or she would have been seen sooner. Also, during her December hospitalization, she was [facility] and desired to follow up with them if possible since [facility]. This turned out to not be possible given her lack of insurance.   I will not start prednisone due to the risk.  She is currently taking xifaxan. She has been approved for patient assistance through the onsite pharmacy here.   I will fax over the serologies. These labs were drawn during her December hospitalization and are in EPIC from 03/26/15-03/29/15.   Dr. Armen PickupFunches

## 2015-08-09 MED FILL — ?SERTRALINE HCL 100 MG TAB: 100 | 30 days supply | Qty: 30 | Fill #3

## 2015-08-09 MED FILL — ?HYDROXYZINE HCL 25 MG TAB: 25 | 30 days supply | Qty: 90 | Fill #2

## 2015-08-09 MED FILL — ?PANTOPRAZOLE SOD DR 40MG: 40 MG | 30 days supply | Qty: 60 | Fill #5

## 2015-08-09 MED FILL — SPIRONOLACTONE 25 MG TABLET: 25 | 30 days supply | Qty: 30 | Fill #2

## 2015-08-09 MED FILL — traZODone HCL 50 MG TABS: 50 | 30 days supply | Qty: 30 | Fill #0

## 2015-08-09 MED FILL — LACTULOSE 10 GM/15 ML SOLN: 10 | 23 days supply | Qty: 4257 | Fill #4

## 2015-08-10 ENCOUNTER — Encounter: Payer: Self-pay | Admitting: Gastroenterology

## 2015-08-10 ENCOUNTER — Other Ambulatory Visit: Payer: Self-pay

## 2015-08-10 ENCOUNTER — Ambulatory Visit (INDEPENDENT_AMBULATORY_CARE_PROVIDER_SITE_OTHER): Payer: No Typology Code available for payment source | Admitting: Gastroenterology

## 2015-08-10 VITALS — BP 141/67 | HR 79 | Temp 97.3°F | Ht 69.0 in | Wt 202.6 lb

## 2015-08-10 DIAGNOSIS — K7682 Hepatic encephalopathy: Secondary | ICD-10-CM

## 2015-08-10 DIAGNOSIS — I8500 Esophageal varices without bleeding: Secondary | ICD-10-CM

## 2015-08-10 DIAGNOSIS — K7030 Alcoholic cirrhosis of liver without ascites: Secondary | ICD-10-CM

## 2015-08-10 DIAGNOSIS — K7290 Hepatic failure, unspecified without coma: Secondary | ICD-10-CM

## 2015-08-10 NOTE — Progress Notes (Signed)
CC'ED TO PCP 

## 2015-08-10 NOTE — Progress Notes (Signed)
Subjective:    Patient ID: Julie Rowland, female    DOB: September 15, 1970, 45 y.o.   MRN: [id number]  Lora Paula, MD  HPI Pt having trouble with ammonia level while on LACTULOSE QID(2.5 TBSP QID)/XIFAXAN. GOT DENIED FOR DISABILITY. HAS PAIN IN HER RIGHT SIDE & SHE WAS TAKING CARAFATE. DIDN'T HELP. TROUBLE SWALLOWING: BOTH SOLIDS AND LIQUIDS. SLEEP: HORRIBLE-DURING DAY AND NIGHT ALITTLE MORE DURING THE DAY. LIFESTYLE HAS CHANGED-WAITED TABLES & DOING PRETTY WELL. LAST ETOH: OVER YEAR. WENT TO [facility] A[facility] WHEN SHE QUIT Sep 20 2012. PUT SELF IN 15 DAY: 2009 & 30 DAY REHAB-2009. FEELS LIKE SHE NEEDS TO HAVE HER ABDOMEN TAPPED.FASTIGUE IS UP. FEELS WEAK. LOOPY/NOODLY LEGS.  TREMORS IN HANDS. CONCERNED ABOUT ASKING LESION: NON-TENDER, BROWN, FLAT.   PT DENIES FEVER, CHILLS, HEMATOCHEZIA, HEMATEMESIS, nausea, vomiting, melena, diarrhea, CHEST PAIN, SHORTNESS OF BREATH,  CHANGE IN BOWEL IN HABITS, constipation, abdominal pain, problems swallowing, problems with sedation, heartburn or indigestion.  Past Medical History  Diagnosis Date  . Migraine headache     At age 71  . Hypertension Dx 2003  . Anxiety Dx 2002  . Seizures (HCC) 2014 and 2009  . Portal hypertension (HCC)   . Ascites 10/2014.    Small volume, not enough to tap  . Hepatic encephalopathy (HCC) 02/2015.   Past Surgical History  Procedure Laterality Date  . Colonoscopy  2015    Hyperplastic polyp. Random biopsies negative for collagenous/microscopic colitis. Performed at [facility].  . Vein ligation and stripping N/A 07/14/2014    Procedure: EXCISION OF VARICOSE VEINS AT UMBILICUS;  Surgeon: Larina Earthly, MD;  Location: [facility] OR;  Service: Vascular;  Laterality: N/A;  . Umbilical hernia repair N/A 07/14/2014    Procedure: HERNIA REPAIR UMBILICAL ADULT;  Surgeon: Larina Earthly, MD;  Location: [facility] OR;  Service: Vascular;  Laterality: N/A;  . Esophagogastroduodenoscopy (egd) with propofol N/A 12/07/2014    YQM:VHQIONGE portal  hypertensive gastropathy/grade 3 varices  . Esophageal banding N/A 12/07/2014    Procedure: ESOPHAGEAL BANDING (4 bands);  Surgeon: West Bali, MD;  Location: [facility] ORS;  Service: Endoscopy;  Laterality: N/A;  . Esophagogastroduodenoscopy (egd) with propofol N/A 02/08/2015    SLF: 1. 2 columns of grad III varices 2. mild portal hypertensive gastropathy  . Gastric varices banding N/A 02/08/2015    Procedure: GASTRIC VARICES BANDING;  Surgeon: West Bali, MD;  Location: [facility] ORS;  Service: Endoscopy;  Laterality: N/A;  3 bands  . Esophagogastroduodenoscopy (egd) with propofol N/A 02/23/2015    Procedure: ESOPHAGOGASTRODUODENOSCOPY (EGD) WITH PROPOFOL;  Surgeon: Ruffin Frederick, MD;  Location[facility] ENDOSCOPY;  Service: Gastroenterology;  Laterality: N/A;   No Known Allergies  Current Outpatient Prescriptions  Medication Sig Dispense Refill  . folic acid (FOLVITE) 800 MCG tablet Take 1 tablet (800 mcg total) by mouth daily.    . ATARAX/VISTARIL 25 MG tablet TAKE 1 TABLET BY MOUTH 3 TIMES DAILY AS NEEDED FOR ANXIETY.    Marland Kitchen CHRONULAC 10 GM/15ML solution Take 45 mLs (30 g total) by mouth 4 (four) times daily.    Marland Kitchen KEPPRA 500 MG tablet TAKE 1/2 TABLET BY MOUTH 2 TIMES DAILY    . PROTONIX) 40 MG tablet Take 1 tablet (40 mg total) by mouth 2 (two) times daily.    Marland Kitchen      Burman Blacksmith 550 MG TABS tablet Take 1 tablet (550 mg total) by mouth 2 (two) times daily.    . sertraline (ZOLOFT) 100 MG  tablet Take 1 tablet (100 mg total) by mouth daily.    Marland Kitchen ALDACTONE 25 MG tablet Take 1 tablet (25 mg total) by mouth daily. PRN   . traMADol (ULTRAM) 50 MG tablet Take 1 tablet (50 mg total) by mouth every 12 (twelve) hours as needed.    . traZODone (DESYREL) 50 MG tablet Take 0.5-1 tablets (25-50 mg total) by mouth at bedtime as needed for sleep.    .       Review of Systems PER HPI OTHERWISE ALL SYSTEMS ARE NEGATIVE.    Objective:   Physical Exam  Constitutional: She is oriented to person, place, and  time. She appears well-developed and well-nourished. No distress.  HENT:  Head: Normocephalic and atraumatic.  Mouth/Throat: Oropharynx is clear and moist. No oropharyngeal exudate.  Eyes: Pupils are equal, round, and reactive to light. Scleral icterus is present.  Neck: Normal range of motion. Neck supple.  Cardiovascular: Normal rate, regular rhythm and normal heart sounds.   Pulmonary/Chest: Effort normal and breath sounds normal. No respiratory distress.  Abdominal: Soft. Bowel sounds are normal. She exhibits no distension. There is no tenderness.  Liver and spleen enlarged to 3 cm below costal margin  Musculoskeletal: She exhibits no edema.  Lymphadenopathy:    She has no cervical adenopathy.  Neurological: She is alert and oriented to person, place, and time.  NO  NEW FOCAL DEFICITS, asterixis  Psychiatric:  FLAT AFFECT, NL MOOD, insight intact  Vitals reviewed.     Assessment & Plan:

## 2015-08-10 NOTE — Assessment & Plan Note (Signed)
NO WARNING SIGNS/SYMPTOMS  NEEDS EGD FOR VARICEAL SCREENING W/ MAC DUE TO POLYPHARMACY. CONTINUE TO MONITOR SYMPTOMS: MELENA OR BRBPR.

## 2015-08-10 NOTE — Assessment & Plan Note (Signed)
DUE TO ETOH-PARTIALLY COMPENSATED DISEASE CLINICALLY BUT WILL NEED RECENT LABS TO CONFIRM CHILD PUGH STATUS.  COMPLETE LABS THIS WEEK. COMPLETE ULTRASOUND AND UPPER ENDOSCOPY IN MAY 2017.  SEE [facility] LIVER TRANSPLANT FOR AN EVALUATION. CONTINUE XIFAXAN AND LACTULOSE.  MOTHER WILL GET ME THE ADDRESS FOR  [place] SO SHE CAN GET YOU DISABILITY AND/OR MEDICAID. FOLLOW UP IN 2 MOS.

## 2015-08-10 NOTE — Patient Instructions (Signed)
COMPLETE LABS THIS WEEK.  COMPLETE ULTRASOUND AND UPPER ENDOSCOPY IN MAY 2017.   SEE [facility] LIVER TRANSPLANT FOR AN EVALUATION.  CONTINUE XIFAXAN AND LACTULOSE.  GET YOUR MOTHER TO SEND ME THE ADDRESS FOR YOUR LAWYER SO WE CAN GET YOU DISABILITY AND/OR MEDICAID.  FOLLOW UP IN 2 MOS.

## 2015-08-10 NOTE — Progress Notes (Signed)
ON RECALL  °

## 2015-08-10 NOTE — Assessment & Plan Note (Signed)
SYMPTOMS NOT IDEALLY CONTROLLED.  WILL OK INTO ADDITIONAL OPTIONS TO CONTROL AMMONIA. COMPLETE LABS THIS WEEK. COMPLETE ULTRASOUND AND UPPER ENDOSCOPY IN MAY 2017.  SEE [facility] LIVER TRANSPLANT FOR AN EVALUATION. CONTINUE XIFAXAN AND LACTULOSE. GET YOUR MOTHER TO SEND ME THE ADDRESS FOR YOUR LAWYER SO WE CAN GET YOU DISABILITY AND/OR MEDICAID. FOLLOW UP IN 2 MOS.

## 2015-08-12 ENCOUNTER — Telehealth: Payer: Self-pay | Admitting: Family Medicine

## 2015-08-12 ENCOUNTER — Encounter: Payer: Self-pay | Admitting: Family Medicine

## 2015-08-12 ENCOUNTER — Encounter: Payer: Self-pay | Admitting: Clinical

## 2015-08-12 ENCOUNTER — Ambulatory Visit: Payer: Medicaid Other | Attending: Family Medicine | Admitting: Family Medicine

## 2015-08-12 VITALS — BP 117/63 | HR 70 | Temp 97.7°F | Resp 16 | Ht 69.0 in | Wt 201.0 lb

## 2015-08-12 DIAGNOSIS — Z79899 Other long term (current) drug therapy: Secondary | ICD-10-CM | POA: Insufficient documentation

## 2015-08-12 DIAGNOSIS — Z87891 Personal history of nicotine dependence: Secondary | ICD-10-CM | POA: Diagnosis not present

## 2015-08-12 DIAGNOSIS — R569 Unspecified convulsions: Secondary | ICD-10-CM | POA: Diagnosis present

## 2015-08-12 DIAGNOSIS — K7030 Alcoholic cirrhosis of liver without ascites: Secondary | ICD-10-CM

## 2015-08-12 LAB — CBC WITH DIFFERENTIAL/PLATELET
BASOS ABS: 0 {cells}/uL (ref 0–200)
Basophils Relative: 0 %
EOS PCT: 2 %
Eosinophils Absolute: 148 cells/uL (ref 15–500)
HCT: 21.3 % — ABNORMAL LOW (ref 35.0–45.0)
Hemoglobin: 7.2 g/dL — ABNORMAL LOW (ref 11.7–15.5)
LYMPHS ABS: 1258 {cells}/uL (ref 850–3900)
LYMPHS PCT: 17 %
MCH: 28.5 pg (ref 27.0–33.0)
MCHC: 33.8 g/dL (ref 32.0–36.0)
MCV: 84.2 fL (ref 80.0–100.0)
MONOS PCT: 9 %
Monocytes Absolute: 666 cells/uL (ref 200–950)
NEUTROS PCT: 72 %
Neutro Abs: 5328 cells/uL (ref 1500–7800)
PLATELETS: 65 10*3/uL — AB (ref 140–400)
RBC: 2.53 MIL/uL — AB (ref 3.80–5.10)
RDW: 18.9 % — AB (ref 11.0–15.0)
WBC: 7.4 10*3/uL (ref 3.8–10.8)

## 2015-08-12 LAB — COMPLETE METABOLIC PANEL WITH GFR
ALT: 69 U/L — AB (ref 6–29)
AST: 175 U/L — AB (ref 10–35)
Albumin: 2.8 g/dL — ABNORMAL LOW (ref 3.6–5.1)
Alkaline Phosphatase: 106 U/L (ref 33–115)
BILIRUBIN TOTAL: 5.1 mg/dL — AB (ref 0.2–1.2)
BUN: 19 mg/dL (ref 7–25)
CO2: 21 mmol/L (ref 20–31)
CREATININE: 2.36 mg/dL — AB (ref 0.50–1.10)
Calcium: 8.9 mg/dL (ref 8.6–10.2)
Chloride: 105 mmol/L (ref 98–110)
GFR, EST AFRICAN AMERICAN: 28 mL/min — AB (ref >=60)
GFR, Est Non African American: 24 mL/min — ABNORMAL LOW (ref >=60)
Glucose, Bld: 98 mg/dL (ref 65–99)
Potassium: 3.1 mmol/L — ABNORMAL LOW (ref 3.5–5.3)
Sodium: 138 mmol/L (ref 135–146)
TOTAL PROTEIN: 7 g/dL (ref 6.1–8.1)

## 2015-08-12 LAB — PROTIME-INR
INR: 2.12 — ABNORMAL HIGH (ref <1.50)
PROTHROMBIN TIME: 24 s — AB (ref 11.6–15.2)

## 2015-08-12 MED ORDER — LEVETIRACETAM 500 MG PO TABS
ORAL_TABLET | ORAL | Status: AC
Start: 2015-08-12 — End: ?

## 2015-08-12 MED FILL — levETIRAcetam 500 MG TABS: 500 | 30 days supply | Qty: 30 | Fill #0

## 2015-08-12 NOTE — Patient Instructions (Addendum)
Julie Rowland was seen today for follow-up.  Diagnoses and all orders for this visit:  Convulsions, unspecified convulsion type (HCC) -     levETIRAcetam (KEPPRA) 500 MG tablet; Take 250 mg (1/2 tablet) twice daily  Alcoholic cirrhosis of liver without ascites (HCC) -     COMPLETE METABOLIC PANEL WITH GFR -     CBC with Differential/Platelet -     AFP tumor marker -     Protime-INR -     Ammonia   F/u in 6 weeks for pap smear   Dr. Armen PickupFunches

## 2015-08-12 NOTE — Progress Notes (Signed)
[facility]  [phone] F/U blood work level  Stated had appointment Neurology and GI  Pain scale # 9 leg pain  No tobacco user  No suicidal thought in the past two weeks

## 2015-08-12 NOTE — Progress Notes (Signed)
Subjective:  Patient ID: Julie Rowland, female    DOB: 1970/04/29  Age: 45 y.o. MRN: [id number]  CC: Follow-up   HPI Julie Rowland presents with her mother for    1. Cirrhosis: she has re-established with GI. Reports having an excellent visit. GI planning for ABUS, EGD, referral to [facility] for consideration of liver transplant. Labs needed. Patient with worsening confusion and fatigue. No nausea, vomiting, swelling, bleeding.   Social History  Substance Use Topics  . Smoking status: Former Smoker -- 20 years    Types: Cigarettes    Quit date: 08/02/2014  . Smokeless tobacco: Never Used  . Alcohol Use: 0.0 oz/week    0 Standard drinks or equivalent per week     Comment: Has not had ETOH since 12/06/2014    Outpatient Prescriptions Prior to Visit  Medication Sig Dispense Refill  . folic acid (FOLVITE) 800 MCG tablet Take 1 tablet (800 mcg total) by mouth daily. 30 tablet 11  . hydrOXYzine (ATARAX/VISTARIL) 25 MG tablet TAKE 1 TABLET BY MOUTH 3 TIMES DAILY AS NEEDED FOR ANXIETY. 90 tablet 2  . lactulose (CHRONULAC) 10 GM/15ML solution Take 45 mLs (30 g total) by mouth 4 (four) times daily. 4185 mL 5  . levETIRAcetam (KEPPRA) 500 MG tablet TAKE 1/2 TABLET BY MOUTH 2 TIMES DAILY 30 tablet 2  . pantoprazole (PROTONIX) 40 MG tablet Take 1 tablet (40 mg total) by mouth 2 (two) times daily. 60 tablet 5  . rifaximin (XIFAXAN) 550 MG TABS tablet Take 1 tablet (550 mg total) by mouth 2 (two) times daily. 180 tablet 3  . sertraline (ZOLOFT) 100 MG tablet Take 1 tablet (100 mg total) by mouth daily. 30 tablet 5  . spironolactone (ALDACTONE) 25 MG tablet Take 1 tablet (25 mg total) by mouth daily. 30 tablet 3  . traMADol (ULTRAM) 50 MG tablet Take 1 tablet (50 mg total) by mouth every 12 (twelve) hours as needed. 60 tablet 0  . traZODone (DESYREL) 50 MG tablet Take 0.5-1 tablets (25-50 mg total) by mouth at bedtime as needed for sleep. 30 tablet 3   No facility-administered  medications prior to visit.    ROS Review of Systems  Constitutional: Positive for fatigue. Negative for fever and chills.  HENT: Negative for trouble swallowing.   Eyes: Negative for visual disturbance.  Respiratory: Negative for shortness of breath.   Cardiovascular: Negative for chest pain and leg swelling (when it does swell R >L).  Gastrointestinal: Negative for nausea, vomiting, abdominal pain, diarrhea, constipation, blood in stool, abdominal distention, anal bleeding and rectal pain.  Musculoskeletal: Positive for back pain. Negative for arthralgias.  Skin: Negative for rash.  Allergic/Immunologic: Negative for immunocompromised state.  Neurological: Negative for seizures.  Hematological: Negative for adenopathy. Bruises/bleeds easily.  Psychiatric/Behavioral: Positive for sleep disturbance, dysphoric mood and decreased concentration. Negative for suicidal ideas, hallucinations, behavioral problems, confusion, self-injury and agitation. The patient is nervous/anxious. The patient is not hyperactive.     Objective:  BP 117/63 mmHg  Pulse 70  Temp(Src) 97.7 F (36.5 C) (Oral)  Resp 16  Ht 5\' 9"  (1.753 m)  Wt 201 lb (91.173 kg)  BMI 29.67 kg/m2  SpO2 97%  BP/Weight 08/12/2015 08/10/2015 07/18/2015  Systolic BP 117 141 130  Diastolic BP 63 67 70  Wt. (Lbs) 201 202.6 202  BMI 29.67 29.91 29.82    Physical Exam  Constitutional: She is oriented to person, place, and time. She appears well-developed and well-nourished. No distress.  HENT:  Head: Normocephalic and atraumatic.  Eyes: No scleral icterus.  Cardiovascular: Normal rate, regular rhythm, normal heart sounds and intact distal pulses.   Pulmonary/Chest: Effort normal and breath sounds normal.  Abdominal: There is hepatomegaly. There is tenderness in the right upper quadrant and epigastric area. There is no rebound, no guarding and no CVA tenderness.  Musculoskeletal: She exhibits no edema or tenderness.  Neurological:  She is alert and oriented to person, place, and time. She displays no tremor.  Negative asterixis   Skin: Skin is warm and dry. Ecchymosis noted. No rash noted.     Psychiatric: She has a normal mood and affect.     Assessment & Plan:   There are no diagnoses linked to this encounter. Julie Rowland was seen today for follow-up.  Diagnoses and all orders for this visit:  Convulsions, unspecified convulsion type (HCC) -     levETIRAcetam (KEPPRA) 500 MG tablet; Take 250 mg (1/2 tablet) twice daily  Alcoholic cirrhosis of liver without ascites (HCC) -     COMPLETE METABOLIC PANEL WITH GFR -     CBC with Differential/Platelet -     AFP tumor marker -     Protime-INR -     Ammonia   Meds ordered this encounter  Medications  . levETIRAcetam (KEPPRA) 500 MG tablet    Sig: Take 250 mg (1/2 tablet) twice daily    Dispense:  30 tablet    Refill:  5    Follow-up: No Follow-up on file.   Dessa Phi MD

## 2015-08-12 NOTE — Assessment & Plan Note (Signed)
Treated by GI  Plan:  Re-ordered labs to confirm Child Pugh Status

## 2015-08-12 NOTE — Progress Notes (Signed)
[facility] 2[facility] 08/12/2015 07/18/2015 06/17/2015 05/17/2015 03/03/2015  Decreased Interest 2 3 3 1  0  Down, Depressed, Hopeless 2 3 3 2 2   PHQ - 2 Score 4 6 6 3 2   Altered sleeping 2 2 0 3 3  Tired, decreased energy 3 3 3 3 2   Change in appetite 2 3 3  0 1  Feeling bad or failure about yourself  2 2 3  0 2  Trouble concentrating 2 3 3 2  0  Moving slowly or fidgety/restless 3 3 3  0 1  Suicidal thoughts 0 0 0 0 -  PHQ-9 Score 18 22 21 11 11     GAD 7 : Generalized Anxiety Score 08/12/2015 07/18/2015 06/17/2015 05/17/2015  Nervous, Anxious, on Edge 3 3 3 3   Control/stop worrying 3 3 3 2   Worry too much - different things 3 3 3 2   Trouble relaxing 3 3 3 3   Restless 2 3 0 0  Easily annoyed or irritable 2 3 0 2  Afraid - awful might happen 2 0 1 0  Total GAD 7 Score 18 18 13  12

## 2015-08-13 LAB — AMMONIA

## 2015-08-13 LAB — AFP TUMOR MARKER: AFP TUMOR MARKER: 4.8 ng/mL (ref <6.1)

## 2015-08-15 ENCOUNTER — Other Ambulatory Visit: Payer: Self-pay

## 2015-08-15 ENCOUNTER — Other Ambulatory Visit: Payer: Self-pay | Admitting: Family Medicine

## 2015-08-15 DIAGNOSIS — Z944 Liver transplant status: Secondary | ICD-10-CM

## 2015-08-16 ENCOUNTER — Telehealth: Payer: Self-pay | Admitting: Family Medicine

## 2015-08-16 ENCOUNTER — Other Ambulatory Visit: Payer: Self-pay | Admitting: Family Medicine

## 2015-08-16 ENCOUNTER — Ambulatory Visit: Payer: Self-pay | Attending: Family Medicine

## 2015-08-16 DIAGNOSIS — K7682 Hepatic encephalopathy: Secondary | ICD-10-CM

## 2015-08-16 DIAGNOSIS — N184 Chronic kidney disease, stage 4 (severe): Secondary | ICD-10-CM

## 2015-08-16 DIAGNOSIS — K7290 Hepatic failure, unspecified without coma: Secondary | ICD-10-CM

## 2015-08-16 MED ORDER — POTASSIUM CHLORIDE CRYS ER 20 MEQ PO TBCR: 20.0000 meq | EXTENDED_RELEASE_TABLET | Freq: Every day | ORAL | Status: DC

## 2015-08-16 MED ORDER — PREDNISONE 5 MG PO TABS: 5.0000 mg | ORAL_TABLET | Freq: Every day | ORAL | Status: DC

## 2015-08-16 MED ORDER — RIFAXIMIN 550 MG PO TABS: 550.0000 mg | ORAL_TABLET | Freq: Two times a day (BID) | ORAL | Status: AC

## 2015-08-16 MED ORDER — RIFAXIMIN 550 MG PO TABS: 550.0000 mg | ORAL_TABLET | Freq: Two times a day (BID) | ORAL | Status: DC

## 2015-08-16 MED FILL — predniSONE 5 MG TABS: 5 | 30 days supply | Qty: 30 | Fill #0

## 2015-08-16 MED FILL — $XIFAXAN 550 MG TABLET: 550 | 15 days supply | Qty: 30 | Fill #0

## 2015-08-16 MED FILL — POTASSIUM CL ER 20 MEQ TAB: 20 | 30 days supply | Qty: 30 | Fill #0

## 2015-08-16 NOTE — Addendum Note (Signed)
Addended by: West BaliFIELDS, SANDI L on: 08/16/2015 02:49 PM   Modules accepted: Orders

## 2015-08-16 NOTE — Telephone Encounter (Signed)
SPOKE TO MOTHER. GETTING AMMONIA DONE TODAY. EXPLAINED TO HER MOM THAT PT NEEDS TO GO TO ED AT [facility] FOR AN EVALUATION IF SHE IS GETTING MOR SLEEPY. SHE HAS DECOMPENSATED LIVER DISEASE AND HAS EXHAUSTED MANAGEMENT OF HER ENCEPHALOPATH AND NEEDS TO BE SEEN AT A TERTIARY CARE CENTER WITH A HEPATOLOGIST. WILL SEND PREDNISONE 5 MG DAILY FOR PT TO START. WARNED HER THAT IT CAN INCREASE HER AMMONIA LEVEL. MOTHER SAID SHE WOULD TAKE PT TOMORROW BUT SHE WAS UNABLE TO GO TODAY. CONTINUE LACTULOSE AND XIFAXAN.

## 2015-08-16 NOTE — Telephone Encounter (Signed)
Called patient Verified name and DOB  Spoke to patient's mother who is her primary caregiver.  Gave lab results  Low potassium, with lower GFR (kidney function) STOP aldactone Take potassium supplement of 20 mEq daily if swelling returns will need to take lasix with extra potassium  Ammonia level is not resulted Patient will need repeat blood draw to check ammonia level   Patient mother, Stanton KidneyDebra, informed me that they did get a call from [facility] yesterday and will call back today.

## 2015-08-17 LAB — AMMONIA: AMMONIA: 147 umol/L — AB (ref 16–53)

## 2015-08-17 LAB — BASIC METABOLIC PANEL WITH GFR
BUN: 20 mg/dL (ref 7–25)
CHLORIDE: 105 mmol/L (ref 98–110)
CO2: 20 mmol/L (ref 20–31)
Calcium: 8.7 mg/dL (ref 8.6–10.2)
Creat: 2.26 mg/dL — ABNORMAL HIGH (ref 0.50–1.10)
GFR, EST AFRICAN AMERICAN: 29 mL/min — AB (ref >=60)
GFR, EST NON AFRICAN AMERICAN: 25 mL/min — AB (ref >=60)
Glucose, Bld: 89 mg/dL (ref 65–99)
POTASSIUM: 3.3 mmol/L — AB (ref 3.5–5.3)
Sodium: 139 mmol/L (ref 135–146)

## 2015-08-18 ENCOUNTER — Telehealth: Payer: Self-pay | Admitting: Gastroenterology

## 2015-08-18 ENCOUNTER — Telehealth: Payer: Self-pay | Admitting: Family Medicine

## 2015-08-18 MED FILL — $XIFAXAN 550 MG TABLET: 550 | 30 days supply | Qty: 60 | Fill #1

## 2015-08-18 NOTE — Telephone Encounter (Signed)
Pt. Mother called to let Pt. PCP know [facility] and pt. Mother gave the wrong phone number of the pt. Room.   Pt. Room number: [phone]

## 2015-08-18 NOTE — Telephone Encounter (Signed)
REVIEWED-NO ADDITIONAL RECOMMENDATIONS. opv after [facility] admission/discharge.

## 2015-08-18 NOTE — Progress Notes (Signed)
REVIEWED. AMMONIA 147. Pt INPT AT [facility].

## 2015-08-18 NOTE — Telephone Encounter (Signed)
PATIENT MOTHER, Julie Rowland, CALLED AND STATED THAT Julie Rowland WAS ADMITTED TO [facility] ROOM NUMBER [id number].  [phone] IS PATIENT DIRECT NUMBER.  THEY HAVE REMOVED FLUID FROM HER AND GIVEN HER A BLOOD TRANSFUSION.

## 2015-08-18 NOTE — Telephone Encounter (Signed)
REVIEWED-NO ADDITIONAL RECOMMENDATIONS. 

## 2015-08-18 NOTE — Telephone Encounter (Signed)
Reviewed patient's [facility] admission notes in care everywhere.  Called Galia's cell phone, she did not pick up. Left VM.  I called her mother Howell RucksDebra Lee who is at home. Reports Marcelino DusterMichelle was admitted last night into [facility]. She was roomed a little after midnight.   [facility] phone # [phone]  Her ammonia level was 143 from 08/16/2015  She has had a paracentesis, took off 2 L. She has received 1 U blood transfusion.  She will see hepatologist in patient.  She will have liver ultrasound and liver biopsy.

## 2015-08-18 NOTE — Telephone Encounter (Signed)
Noted  

## 2015-08-24 ENCOUNTER — Encounter: Payer: Self-pay | Admitting: Gastroenterology

## 2015-08-24 ENCOUNTER — Telehealth: Payer: Self-pay | Admitting: Gastroenterology

## 2015-08-24 NOTE — Telephone Encounter (Signed)
SPOKE WITH DR. KING. WE DISCUSSED WHAT PT NEEDS TO DO TO HAVE HER LIVER TRANSPLANT EVALUATION.  REVIEWED LABS/SEROLOGIES. DOUBTS STEROIDS WOULD BE USEFUL IN THIS SETTING AND FEELS SEROLOGIES ELEVATED DUE TO ADVANCED ETOHIC LIVER DISEASE AND CONCERNED PT MAY STILL BE USING ETOH DUE TO TRANSIENT ELEVATION AND NEAR-NORMALIZATION OF HER ENZYMES WITHOUT INTERVENTION. DIFFERENTIAL DIAGNOSIS INCLUDES: ISCHEMIC HEPATITIS. PT NEEDS INSURANCE. WILL SEND LETTER TO LAWYER. ONCE INSURANCE OBTAINED WILL CONTACT DR. KING'S [facility] FOR OPV & PLAN FOR ASSESSMENT OF MENTAL HEALTH AND SOCIAL ISSUE THAT MAY AFFECT ABILITY TO BE CONSIDERED FOR TRANSPLANT.

## 2015-08-25 ENCOUNTER — Encounter: Payer: Self-pay | Admitting: General Practice

## 2015-08-25 ENCOUNTER — Telehealth: Payer: Self-pay | Admitting: Gastroenterology

## 2015-08-25 NOTE — Telephone Encounter (Signed)
Body of disability letter sent today.  Re: Julie CompanionMichelle Rowland, 09/15/1970   Dear Madam/Sir:  I am writing regarding [name]. She was first seen and evaluated at my office in May 2016 for Cirrhosis due to alcohol and obesity. Her disease is complicated by portal hypertension, esophageal varices, hepatic encephalopathy, and ascites. Her liver disease has worsened since July 2016. Make no mistake she is disabled. She has decompensated liver disease and would currently be evaluated and a candidate for a liver transplant if she had insurance coverage.  As of Aug 15, 2015 her life expectancy is 1- 3 years without a liver transplant.  Please feel free to contact me with additional questions.  Sincerely.  Sandi L. Jettie BoozeField, M.D.

## 2015-08-26 MED FILL — CIPROFLOXACIN HCL 500 MG TA: 500 | 30 days supply | Qty: 15 | Fill #0

## 2015-08-29 ENCOUNTER — Ambulatory Visit: Payer: Self-pay | Attending: Family Medicine

## 2015-08-29 ENCOUNTER — Other Ambulatory Visit: Payer: Self-pay | Admitting: Family Medicine

## 2015-08-29 DIAGNOSIS — K7030 Alcoholic cirrhosis of liver without ascites: Secondary | ICD-10-CM | POA: Insufficient documentation

## 2015-08-29 LAB — COMPLETE METABOLIC PANEL WITH GFR
ALT: 65 U/L — AB (ref 6–29)
AST: 74 U/L — ABNORMAL HIGH (ref 10–35)
Albumin: 2.4 g/dL — ABNORMAL LOW (ref 3.6–5.1)
Alkaline Phosphatase: 87 U/L (ref 33–115)
BUN: 25 mg/dL (ref 7–25)
CHLORIDE: 107 mmol/L (ref 98–110)
CO2: 17 mmol/L — AB (ref 20–31)
CREATININE: 2.51 mg/dL — AB (ref 0.50–1.10)
Calcium: 8.5 mg/dL — ABNORMAL LOW (ref 8.6–10.2)
GFR, EST AFRICAN AMERICAN: 26 mL/min — AB (ref >=60)
GFR, Est Non African American: 22 mL/min — ABNORMAL LOW (ref >=60)
Glucose, Bld: 101 mg/dL — ABNORMAL HIGH (ref 65–99)
Potassium: 4.5 mmol/L (ref 3.5–5.3)
SODIUM: 139 mmol/L (ref 135–146)
Total Bilirubin: 4.1 mg/dL — ABNORMAL HIGH (ref 0.2–1.2)
Total Protein: 6.2 g/dL (ref 6.1–8.1)

## 2015-08-29 LAB — MAGNESIUM: MAGNESIUM: 1.6 mg/dL (ref 1.5–2.5)

## 2015-08-29 NOTE — Addendum Note (Signed)
Addended by: Dessa PhiFUNCHES, Albirtha Grinage on: 08/29/2015 02:47 PM   Modules accepted: Orders

## 2015-08-30 ENCOUNTER — Ambulatory Visit (HOSPITAL_COMMUNITY): Payer: MEDICAID

## 2015-08-30 NOTE — Patient Instructions (Signed)
Julie Rowland  08/30/2015     @PREFPERIOPPHARMACY @   Your procedure is scheduled on 09/06/2015.  Report to [name] at 7:00 A.M.  Call this number if you have problems the morning of surgery:  [phone]   Remember:  Do not eat food or drink liquids after midnight.  Take these medicines the morning of surgery with A SIP OF WATER Atarax if needed, Keppra, Protonix, Prednisone, Xifaxan, Zoloft, Ultram if needed   Do not wear jewelry, make-up or nail polish.  Do not wear lotions, powders, or perfumes.  You may wear deodorant.  Do not shave 48 hours prior to surgery.  Men may shave face and neck.  Do not bring valuables to the hospital.  [facility] is not responsible for any belongings or valuables.  Contacts, dentures or bridgework may not be worn into surgery.  Leave your suitcase in the car.  After surgery it may be brought to your room.  For patients admitted to the hospital, discharge time will be determined by your treatment team.  Patients discharged the day of surgery will not be allowed to drive home.   Please read over the following fact sheets that you were given. Anesthesia Post-op Instructions     PATIENT INSTRUCTIONS POST-ANESTHESIA  IMMEDIATELY FOLLOWING SURGERY:  Do not drive or operate machinery for the first twenty four hours after surgery.  Do not make any important decisions for twenty four hours after surgery or while taking narcotic pain medications or sedatives.  If you develop intractable nausea and vomiting or a severe headache please notify your doctor immediately.  FOLLOW-UP:  Please make an appointment with your surgeon as instructed. You do not need to follow up with anesthesia unless specifically instructed to do so.  WOUND CARE INSTRUCTIONS (if applicable):  Keep a dry clean dressing on the anesthesia/puncture wound site if there is drainage.  Once the wound has quit draining you may leave it open to air.  Generally you should leave the  bandage intact for twenty four hours unless there is drainage.  If the epidural site drains for more than 36-48 hours please call the anesthesia department.  QUESTIONS?:  Please feel free to call your physician or the hospital operator if you have any questions, and they will be happy to assist you.      Esophagogastroduodenoscopy Esophagogastroduodenoscopy (EGD) is a procedure that is used to examine the lining of the esophagus, stomach, and first part of the small intestine (duodenum). A long, flexible, lighted tube with a camera attached (endoscope) is inserted down the throat to view these organs. This procedure is done to detect problems or abnormalities, such as inflammation, bleeding, ulcers, or growths, in order to treat them. The procedure lasts 5-20 minutes. It is usually an outpatient procedure, but it may need to be performed in a hospital in emergency cases. LET [facility] KNOW ABOUT:  Any allergies you have.  All medicines you are taking, including vitamins, herbs, eye drops, creams, and over-the-counter medicines.  Previous problems you or members of your family have had with the use of anesthetics.  Any blood disorders you have.  Previous surgeries you have had.  Medical conditions you have. RISKS AND COMPLICATIONS Generally, this is a safe procedure. However, problems can occur and include:  Infection.  Bleeding.  Tearing (perforation) of the esophagus, stomach, or duodenum.  Difficulty breathing or not being able to breathe.  Excessive sweating.  Spasms of the larynx.  Slowed heartbeat.  Low blood pressure. BEFORE THE PROCEDURE  Do not eat or drink anything after midnight on the night before the procedure or as directed by your health care provider.  Do not take your regular medicines before the procedure if your health care provider asks you not to. Ask your health care provider about changing or stopping those medicines.  If you wear  dentures, be prepared to remove them before the procedure.  Arrange for someone to drive you home after the procedure. PROCEDURE  A numbing medicine (local anesthetic) may be sprayed in your throat for comfort and to stop you from gagging or coughing.  You will have an IV tube inserted in a vein in your hand or arm. You will receive medicines and fluids through this tube.  You will be given a medicine to relax you (sedative).  A pain reliever will be given through the IV tube.  A mouth guard may be placed in your mouth to protect your teeth and to keep you from biting on the endoscope.  You will be asked to lie on your left side.  The endoscope will be inserted down your throat and into your esophagus, stomach, and duodenum.  Air will be put through the endoscope to allow your health care provider to clearly view the lining of your esophagus.  The lining of your esophagus, stomach, and duodenum will be examined. During the exam, your health care provider may:  Remove tissue to be examined under a microscope (biopsy) for inflammation, infection, or other medical problems.  Remove growths.  Remove objects (foreign bodies) that are stuck.  Treat any bleeding with medicines or other devices that stop tissues from bleeding (hot cautery, clipping devices).  Widen (dilate) or stretch narrowed areas of your esophagus and stomach.  The endoscope will be withdrawn. AFTER THE PROCEDURE  You will be taken to a recovery area for observation. Your blood pressure, heart rate, breathing rate, and blood oxygen level will be monitored often until the medicines you were given have worn off.  Do not eat or drink anything until the numbing medicine has worn off and your gag reflex has returned. You may choke.  Your health care provider should be able to discuss his or her findings with you. It will take longer to discuss the test results if any biopsies were taken.   This information is not  intended to replace advice given to you by your health care provider. Make sure you discuss any questions you have with your health care provider.   Document Released: 08/03/2004 Document Revised: 04/23/2014 Document Reviewed: 03/05/2012 Elsevier Interactive Patient Education [employer].

## 2015-08-31 ENCOUNTER — Encounter (HOSPITAL_COMMUNITY)
Admission: RE | Admit: 2015-08-31 | Discharge: 2015-08-31 | Disposition: A | Discharge status: Home / Self Care | Payer: Self-pay | Source: Ambulatory Visit | Attending: Gastroenterology | Admitting: Gastroenterology

## 2015-08-31 ENCOUNTER — Encounter (HOSPITAL_COMMUNITY): Payer: Self-pay

## 2015-08-31 ENCOUNTER — Telehealth: Payer: Self-pay | Admitting: Family Medicine

## 2015-08-31 NOTE — Pre-Procedure Instructions (Signed)
Patient did not show for PAT. Called to see if patient was going to still have procedure and mother answered. Mom states that patient is sleeping, " [facility]. She cant drive and I am handicapped. We didn't know about this labwork today. [facility] and [facility]. Spoke with Dr Jayme CloudGonzalez about extensive history and reviewed chart with him. Will draw stat labs morning of procedure. Told Mom to have patient arrive 30 min earlier than scheduled to repeat blood work. States that patient is cognitive and able to sign for herself but they prefer to be present if she is signing any documents. She states that patient's father will be with her on the day of her procedure. Went over all preop information with mother including meds to take as well as NPO after midnight. States dad will drive patient home. Mom verbalizes understanding of all information.

## 2015-08-31 NOTE — Telephone Encounter (Signed)
Patient's mom called in regard to patient medicines.

## 2015-08-31 NOTE — Telephone Encounter (Signed)
Pt mother has medication question  [facility] advised to pt not to take Rx tramadol and Zoloft  Pt mother asking if pt can take them since she is in need  Pt advised to continue with [facility] advised.

## 2015-09-02 ENCOUNTER — Ambulatory Visit: Payer: Medicaid Other | Attending: Family Medicine | Admitting: Family Medicine

## 2015-09-02 ENCOUNTER — Telehealth: Payer: Self-pay

## 2015-09-02 ENCOUNTER — Encounter: Payer: Self-pay | Admitting: Family Medicine

## 2015-09-02 VITALS — BP 117/69 | HR 73 | Temp 97.7°F | Resp 16 | Ht 69.0 in | Wt 193.0 lb

## 2015-09-02 DIAGNOSIS — D638 Anemia in other chronic diseases classified elsewhere: Secondary | ICD-10-CM | POA: Diagnosis not present

## 2015-09-02 DIAGNOSIS — R569 Unspecified convulsions: Secondary | ICD-10-CM | POA: Diagnosis not present

## 2015-09-02 DIAGNOSIS — M545 Low back pain, unspecified: Secondary | ICD-10-CM

## 2015-09-02 DIAGNOSIS — K7030 Alcoholic cirrhosis of liver without ascites: Secondary | ICD-10-CM | POA: Diagnosis not present

## 2015-09-02 DIAGNOSIS — R531 Weakness: Secondary | ICD-10-CM

## 2015-09-02 MED ORDER — FOLIC ACID 800 MCG PO TABS: 800.0000 ug | ORAL_TABLET | Freq: Every day | ORAL | Status: AC

## 2015-09-02 MED ORDER — TRAMADOL HCL 50 MG PO TABS: 50.0000 mg | ORAL_TABLET | Freq: Two times a day (BID) | ORAL | Status: AC | PRN

## 2015-09-02 MED ORDER — CIPROFLOXACIN HCL 250 MG PO TABS: 250.0000 mg | ORAL_TABLET | Freq: Two times a day (BID) | ORAL | Status: DC

## 2015-09-02 MED ORDER — CIPROFLOXACIN HCL 500 MG PO TABS: 250.0000 mg | ORAL_TABLET | Freq: Every day | ORAL | Status: AC

## 2015-09-02 MED ORDER — ZINC SULFATE 220 (50 ZN) MG PO CAPS: 220.0000 mg | ORAL_CAPSULE | Freq: Every day | ORAL | Status: AC

## 2015-09-02 NOTE — Progress Notes (Signed)
Subjective:  Patient ID: Julie Rowland, female    DOB: [birth year]  Age: 45 y.o. MRN: [id number]  CC: Hospitalization Follow-up   HPI [name] presents for   1. Hospitalization follow up: at the urging of her GI doctor she went to [facility] ED and was hospitalized from 5/3-5/02/2016. She received paracentesis, blood transfusion x 2, attempted EGD with esophageal varices banding. She developed for altered mental status and somnolence during her hospitalization. She was evaluated by hepatology who has recommended liver transplant.  SBP Prophylactic ciprofloxacin 250 mg once daily, and zinc sulfate 220 mg BID were added to her medication regimen.  She was advised to take aldactone 25 mg daily and lasix was discontinued due to hypokalemia. Tramadol, trazodone and atarax were discontinued due to her somnolence,  but her mother called the attending physician she saw at [facility] who advised that tramadol and atarax could be continued sparingly. All other meds including lactulose and xifaxan were continued. She reports having a BM or 2 after every lactulose dose.   She admits to generalized weakness. She walks with a cane. She had not had recurrent seizures since 03/2015. She continues to take Keppra. She reports depressed mood after being told she has a life expectancy of 6 months.    Social History  Substance Use Topics  . Smoking status: Former Smoker -- 20 years    Types: Cigarettes    Quit date: 08/02/2014  . Smokeless tobacco: Never Used  . Alcohol Use: 0.0 oz/week    0 Standard drinks or equivalent per week     Comment: Has not had ETOH since 12/05/2012   Outpatient Prescriptions Prior to Visit  Medication Sig Dispense Refill  . folic acid (FOLVITE) 800 MCG tablet Take 1 tablet (800 mcg total) by mouth daily. (Patient not taking: Reported on 08/31/2015) 30 tablet 11  . hydrOXYzine (ATARAX/VISTARIL) 25 MG tablet TAKE 1 TABLET BY MOUTH 3 TIMES DAILY AS NEEDED FOR ANXIETY. (Patient  not taking: Reported on 08/31/2015) 90 tablet 2  . lactulose (CHRONULAC) 10 GM/15ML solution Take 45 mLs (30 g total) by mouth 4 (four) times daily. 4185 mL 5  . levETIRAcetam (KEPPRA) 500 MG tablet Take 250 mg (1/2 tablet) twice daily 30 tablet 5  . pantoprazole (PROTONIX) 40 MG tablet Take 1 tablet (40 mg total) by mouth 2 (two) times daily. 60 tablet 5  . potassium chloride SA (K-DUR,KLOR-CON) 20 MEQ tablet Take 1 tablet (20 mEq total) by mouth daily. 30 tablet 3  . predniSONE (DELTASONE) 5 MG tablet Take 1 tablet (5 mg total) by mouth daily with breakfast. 30 tablet 11  . rifaximin (XIFAXAN) 550 MG TABS tablet Take 1 tablet (550 mg total) by mouth 2 (two) times daily. 180 tablet 3  . sertraline (ZOLOFT) 100 MG tablet Take 1 tablet (100 mg total) by mouth daily. 30 tablet 5  . traMADol (ULTRAM) 50 MG tablet Take 1 tablet (50 mg total) by mouth every 12 (twelve) hours as needed. (Patient not taking: Reported on 08/31/2015) 60 tablet 0   No facility-administered medications prior to visit.    ROS Review of Systems  Constitutional: Positive for fatigue. Negative for fever and chills.  Eyes: Negative for visual disturbance.  Respiratory: Negative for shortness of breath.   Cardiovascular: Negative for chest pain.  Gastrointestinal: Positive for abdominal pain. Negative for nausea, vomiting, blood in stool and anal bleeding.  Musculoskeletal: Negative for back pain and arthralgias.  Skin: Negative for rash.  Allergic/Immunologic: Negative for  immunocompromised state.  Neurological: Positive for weakness. Negative for seizures.  Hematological: Negative for adenopathy. Does not bruise/bleed easily.  Psychiatric/Behavioral: Positive for dysphoric mood. Negative for suicidal ideas. The patient is nervous/anxious.     Objective:  BP 117/69 mmHg  Pulse 73  Temp(Src) 97.7 F (36.5 C) (Oral)  Resp 16  Ht 5\' 9"  (1.753 m)  Wt 193 lb (87.544 kg)  BMI 28.49 kg/m2  SpO2 99%  BP/Weight 09/02/2015  08/12/2015 08/10/2015  Systolic BP 117 117 141  Diastolic BP 69 63 67  Wt. (Lbs) 193 201 202.6  BMI 28.49 29.67 29.91   Physical Exam  Constitutional: She is oriented to person, place, and time. She appears well-developed and well-nourished. No distress.  HENT:  Head: Normocephalic and atraumatic.  Eyes: Conjunctivae are normal. No scleral icterus.  Cardiovascular: Normal rate, regular rhythm, normal heart sounds and intact distal pulses.   Pulmonary/Chest: Effort normal and breath sounds normal.  Abdominal: There is hepatomegaly. There is generalized tenderness.  Generalized abdominal tenderness without rebound and guarding  Hepatomegaly   Musculoskeletal: She exhibits no edema.  Neurological: She is alert and oriented to person, place, and time.  Skin: Skin is warm and dry. No rash noted.  Psychiatric: She has a normal mood and affect.     Chemistry      Component Value Date/Time   NA 139 08/29/2015 1436   K 4.5 08/29/2015 1436   CL 107 08/29/2015 1436   CO2 17* 08/29/2015 1436   BUN 25 08/29/2015 1436   CREATININE 2.51* 08/29/2015 1436   CREATININE 2.72* 03/30/2015 0640   CREATININE 0.89 12/18/2008 2030      Component Value Date/Time   CALCIUM 8.5* 08/29/2015 1436   ALKPHOS 87 08/29/2015 1436   AST 74* 08/29/2015 1436   ALT 65* 08/29/2015 1436   BILITOT 4.1* 08/29/2015 1436      Assessment & Plan:   There are no diagnoses linked to this encounter. Destri was seen today for hospitalization follow-up.  Diagnoses and all orders for this visit:  Anemia, chronic disease -     folic acid (FOLVITE) 800 MCG tablet; Take 1 tablet (800 mcg total) by mouth daily.  Bilateral low back pain without sciatica -     traMADol (ULTRAM) 50 MG tablet; Take 1 tablet (50 mg total) by mouth every 12 (twelve) hours as needed.  Alcoholic cirrhosis of liver without ascites (HCC) -     Ambulatory referral to Home Health -     Amb ref to Medical Nutrition Therapy-MNT -      Discontinue: ciprofloxacin (CIPRO) 250 MG tablet; Take 1 tablet (250 mg total) by mouth 2 (two) times daily. -     zinc sulfate 220 (50 Zn) MG capsule; Take 1 capsule (220 mg total) by mouth daily. -     ciprofloxacin (CIPRO) 500 MG tablet; Take 0.5 tablets (250 mg total) by mouth daily with breakfast. For SBP prophylaxis in setting of cirrhosis  Convulsions, unspecified convulsion type (HCC) -     Ambulatory referral to Home Health  Generalized weakness -     Ambulatory referral to Home Health   Meds ordered this encounter  Medications  . folic acid (FOLVITE) 800 MCG tablet    Sig: Take 1 tablet (800 mcg total) by mouth daily.    Dispense:  30 tablet    Refill:  11  . traMADol (ULTRAM) 50 MG tablet    Sig: Take 1 tablet (50 mg total) by mouth every 12 (twelve)  hours as needed.    Dispense:  60 tablet    Refill:  0  . ciprofloxacin (CIPRO) 250 MG tablet    Sig: Take 1 tablet (250 mg total) by mouth 2 (two) times daily.    Dispense:  90 tablet    Refill:  3  . zinc sulfate 220 (50 Zn) MG capsule    Sig: Take 1 capsule (220 mg total) by mouth daily.    Dispense:  60 capsule    Refill:  11    Follow-up: No Follow-up on file.   Dessa Phi MD

## 2015-09-02 NOTE — Progress Notes (Signed)
F/U [facility]  Lab results  No tobacco user  No suicidal thoughts in the past two weeks

## 2015-09-02 NOTE — Assessment & Plan Note (Signed)
End stage cirrhosis   EGD scheduled for next week Patient now has medicaid and is moving forward with the liver transplant process PT, OT, nutrition recommended and ordered

## 2015-09-02 NOTE — Telephone Encounter (Signed)
Patient had [facility] on 09/02/15 at [facility], and Dr. Armen PickupFunches [facility] for gait training, transfer training, and stair training. Placed call to patient to determine preferred home health agency. Spoke with patient's mother, Dalbert GarnetDeborah Lee, who is listed on [employer] Release. Patient's mother indicated [facility] was preferred agency. If [facility] unable to accept referral, patient's mother indicated she did not have a second preference.   This Case Manager placed call to [facility] [phone]) and spoke with Fayrene FearingJames. Informed him that patient needing HH PT services, insurance-Medicaid. Inquired if [facility] accepting outpatient referrals. He indicated referral and supporting documentation should be faxed to Intake at [facility], and it would be determined if referral could be accepted. Information faxed as requested.  This Case Manager received return call from MadisonJamie at [facility] who indicated [facility] unable to accept referral due to lack of staffing.  Also placed call to [facility] [phone]). Spoke with Toni Amendourtney who indicated referral should be sent to Intake (Fax #[phone]). Referral and supporting documentation faxed as requested.

## 2015-09-02 NOTE — Telephone Encounter (Signed)
This Case Manager received return call from Benjamin Perezourtney with [facility] who indicated they were unable to accept case as Medicaid does not cover [facility]. This Case Manager sent communication to Dr. Armen PickupFunches providing update.

## 2015-09-02 NOTE — Patient Instructions (Addendum)
Julie Rowland was seen today for hospitalization follow-up.  Diagnoses and all orders for this visit:  Anemia, chronic disease -     folic acid (FOLVITE) 800 MCG tablet; Take 1 tablet (800 mcg total) by mouth daily.  Bilateral low back pain without sciatica -     traMADol (ULTRAM) 50 MG tablet; Take 1 tablet (50 mg total) by mouth every 12 (twelve) hours as needed.  Alcoholic cirrhosis of liver without ascites (HCC) -     Ambulatory referral to Home Health -     Amb ref to Medical Nutrition Therapy-MNT -     ciprofloxacin (CIPRO) 250 MG tablet; Take 1 tablet (250 mg total) by mouth 2 (two) times daily. -     zinc sulfate 220 (50 Zn) MG capsule; Take 1 capsule (220 mg total) by mouth daily.  Convulsions, unspecified convulsion type [facility](HCC) -     Ambulatory referral to Home Health  Generalized weakness -     Ambulatory referral to Home Health    F/u in 4 weeks for cirrhosis   Dr. Armen PickupFunches

## 2015-09-06 ENCOUNTER — Ambulatory Visit (HOSPITAL_COMMUNITY)
Admit: 2015-09-06 | Discharge: 2015-09-06 | Disposition: A | Discharge status: Home / Self Care | Payer: Medicaid Other | Attending: Gastroenterology | Admitting: Gastroenterology

## 2015-09-06 ENCOUNTER — Other Ambulatory Visit (HOSPITAL_COMMUNITY): Payer: Medicaid Other

## 2015-09-06 ENCOUNTER — Ambulatory Visit (HOSPITAL_COMMUNITY)
Admission: RE | Admit: 2015-09-06 | Discharge: 2015-09-06 | Disposition: A | Discharge status: Home / Self Care | Payer: Medicaid Other | Source: Ambulatory Visit | Attending: Gastroenterology | Admitting: Gastroenterology

## 2015-09-06 ENCOUNTER — Ambulatory Visit (HOSPITAL_COMMUNITY): Admission: RE | Admit: 2015-09-06 | Payer: Medicaid Other | Source: Ambulatory Visit | Admitting: Gastroenterology

## 2015-09-06 ENCOUNTER — Encounter (HOSPITAL_COMMUNITY): Payer: Self-pay

## 2015-09-06 ENCOUNTER — Encounter (HOSPITAL_COMMUNITY)
Admission: RE | Disposition: A | Discharge status: Home / Self Care | Payer: Self-pay | Source: Ambulatory Visit | Attending: Gastroenterology

## 2015-09-06 ENCOUNTER — Ambulatory Visit (HOSPITAL_COMMUNITY): Payer: Medicaid Other | Admitting: Anesthesiology

## 2015-09-06 ENCOUNTER — Encounter (HOSPITAL_COMMUNITY): Payer: Self-pay | Admitting: *Deleted

## 2015-09-06 DIAGNOSIS — E669 Obesity, unspecified: Secondary | ICD-10-CM | POA: Insufficient documentation

## 2015-09-06 DIAGNOSIS — Z79899 Other long term (current) drug therapy: Secondary | ICD-10-CM | POA: Insufficient documentation

## 2015-09-06 DIAGNOSIS — Z87891 Personal history of nicotine dependence: Secondary | ICD-10-CM | POA: Diagnosis not present

## 2015-09-06 DIAGNOSIS — R569 Unspecified convulsions: Secondary | ICD-10-CM | POA: Diagnosis not present

## 2015-09-06 DIAGNOSIS — Z09 Encounter for follow-up examination after completed treatment for conditions other than malignant neoplasm: Secondary | ICD-10-CM | POA: Insufficient documentation

## 2015-09-06 DIAGNOSIS — K3189 Other diseases of stomach and duodenum: Secondary | ICD-10-CM

## 2015-09-06 DIAGNOSIS — K922 Gastrointestinal hemorrhage, unspecified: Secondary | ICD-10-CM | POA: Insufficient documentation

## 2015-09-06 DIAGNOSIS — K7290 Hepatic failure, unspecified without coma: Secondary | ICD-10-CM | POA: Insufficient documentation

## 2015-09-06 DIAGNOSIS — F419 Anxiety disorder, unspecified: Secondary | ICD-10-CM | POA: Insufficient documentation

## 2015-09-06 DIAGNOSIS — I8500 Esophageal varices without bleeding: Secondary | ICD-10-CM | POA: Diagnosis not present

## 2015-09-06 DIAGNOSIS — R140 Abdominal distension (gaseous): Secondary | ICD-10-CM

## 2015-09-06 DIAGNOSIS — I10 Essential (primary) hypertension: Secondary | ICD-10-CM | POA: Insufficient documentation

## 2015-09-06 DIAGNOSIS — K766 Portal hypertension: Secondary | ICD-10-CM | POA: Diagnosis not present

## 2015-09-06 DIAGNOSIS — R188 Other ascites: Secondary | ICD-10-CM | POA: Insufficient documentation

## 2015-09-06 HISTORY — PX: ESOPHAGOGASTRODUODENOSCOPY (EGD) WITH PROPOFOL: SHX5813

## 2015-09-06 LAB — BASIC METABOLIC PANEL
Anion gap: 11 (ref 5–15)
BUN: 31 mg/dL — AB (ref 6–20)
CHLORIDE: 107 mmol/L (ref 101–111)
CO2: 17 mmol/L — ABNORMAL LOW (ref 22–32)
Calcium: 8.5 mg/dL — ABNORMAL LOW (ref 8.9–10.3)
Creatinine, Ser: 2.88 mg/dL — ABNORMAL HIGH (ref 0.44–1.00)
GFR, EST AFRICAN AMERICAN: 22 mL/min — AB (ref >60)
GFR, EST NON AFRICAN AMERICAN: 19 mL/min — AB (ref >60)
Glucose, Bld: 102 mg/dL — ABNORMAL HIGH (ref 65–99)
POTASSIUM: 2.9 mmol/L — AB (ref 3.5–5.1)
SODIUM: 135 mmol/L (ref 135–145)

## 2015-09-06 LAB — CBC WITH DIFFERENTIAL/PLATELET
BASOS ABS: 0 10*3/uL (ref 0.0–0.1)
Basophils Relative: 0 %
EOS ABS: 0.3 10*3/uL (ref 0.0–0.7)
EOS PCT: 4 %
HCT: 24 % — ABNORMAL LOW (ref 36.0–46.0)
Hemoglobin: 8.3 g/dL — ABNORMAL LOW (ref 12.0–15.0)
Lymphocytes Relative: 16 %
Lymphs Abs: 1.2 10*3/uL (ref 0.7–4.0)
MCH: 30.9 pg (ref 26.0–34.0)
MCHC: 34.6 g/dL (ref 30.0–36.0)
MCV: 89.2 fL (ref 78.0–100.0)
MONO ABS: 0.7 10*3/uL (ref 0.1–1.0)
Monocytes Relative: 8 %
Neutro Abs: 5.8 10*3/uL (ref 1.7–7.7)
Neutrophils Relative %: 72 %
PLATELETS: 79 10*3/uL — AB (ref 150–400)
RBC: 2.69 MIL/uL — AB (ref 3.87–5.11)
RDW: 20 % — AB (ref 11.5–15.5)
WBC: 8 10*3/uL (ref 4.0–10.5)

## 2015-09-06 LAB — HCG, SERUM, QUALITATIVE: PREG SERUM: NEGATIVE

## 2015-09-06 SURGERY — ESOPHAGOGASTRODUODENOSCOPY (EGD) WITH PROPOFOL
Anesthesia: Monitor Anesthesia Care

## 2015-09-06 MED ORDER — ONDANSETRON HCL 4 MG/2ML IJ SOLN: 4.0000 mg | Freq: Once | INTRAMUSCULAR | Status: DC | PRN

## 2015-09-06 MED ORDER — FENTANYL CITRATE (PF) 100 MCG/2ML IJ SOLN
25.0000 ug | INTRAMUSCULAR | Status: AC
  Administered 2015-09-06 (×2): 25 ug via INTRAVENOUS

## 2015-09-06 MED ORDER — MIDAZOLAM HCL 5 MG/5ML IJ SOLN
INTRAMUSCULAR | Status: DC | PRN
  Administered 2015-09-06 (×2): 1 mg via INTRAVENOUS

## 2015-09-06 MED ORDER — LIDOCAINE VISCOUS 2 % MT SOLN
6.0000 mL | Freq: Once | OROMUCOSAL | Status: AC
  Administered 2015-09-06: 6 mL via OROMUCOSAL

## 2015-09-06 MED ORDER — FENTANYL CITRATE (PF) 100 MCG/2ML IJ SOLN
INTRAMUSCULAR | Status: AC
  Filled 2015-09-06: qty 2

## 2015-09-06 MED ORDER — MIDAZOLAM HCL 2 MG/2ML IJ SOLN
INTRAMUSCULAR | Status: AC
  Filled 2015-09-06: qty 2

## 2015-09-06 MED ORDER — PROPOFOL 10 MG/ML IV BOLUS
INTRAVENOUS | Status: AC
  Filled 2015-09-06: qty 40

## 2015-09-06 MED ORDER — ALBUMIN HUMAN 25 % IV SOLN
INTRAVENOUS | Status: AC
  Filled 2015-09-06: qty 100

## 2015-09-06 MED ORDER — PROPOFOL 500 MG/50ML IV EMUL
INTRAVENOUS | Status: DC | PRN
  Administered 2015-09-06: 09:00:00 via INTRAVENOUS
  Administered 2015-09-06: 75 ug/kg/min via INTRAVENOUS

## 2015-09-06 MED ORDER — MIDAZOLAM HCL 2 MG/2ML IJ SOLN
1.0000 mg | INTRAMUSCULAR | Status: DC | PRN
  Administered 2015-09-06: 2 mg via INTRAVENOUS

## 2015-09-06 MED ORDER — ALBUMIN HUMAN 25 % IV SOLN
25.0000 g | Freq: Once | INTRAVENOUS | Status: AC
Start: 2015-09-06 — End: 2015-09-06
  Administered 2015-09-06: 25 g via INTRAVENOUS

## 2015-09-06 MED ORDER — LACTATED RINGERS IV SOLN
INTRAVENOUS | Status: DC
  Administered 2015-09-06: 08:00:00 via INTRAVENOUS

## 2015-09-06 MED ORDER — FENTANYL CITRATE (PF) 100 MCG/2ML IJ SOLN: 25.0000 ug | INTRAMUSCULAR | Status: DC | PRN

## 2015-09-06 MED ORDER — LIDOCAINE VISCOUS 2 % MT SOLN
OROMUCOSAL | Status: AC
  Filled 2015-09-06: qty 15

## 2015-09-06 NOTE — Transfer of Care (Signed)
Immediate Anesthesia Transfer of Care Note  Patient: Julie Rowland  Procedure(s) Performed: Procedure(s) with comments: ESOPHAGOGASTRODUODENOSCOPY (EGD) WITH PROPOFOL (N/A) - 830  Patient Location: PACU  Anesthesia Type:MAC  Level of Consciousness: awake and patient cooperative  Airway & Oxygen Therapy: Patient Spontanous Breathing and Patient connected to face mask oxygen  Post-op Assessment: Report given to RN, Post -op Vital signs reviewed and stable and Patient moving all extremities  Post vital signs: Reviewed and stable  Last Vitals:  Filed Vitals:   09/06/15 0815 09/06/15 0820  BP: 115/59   Temp:    Resp: 12 17    Last Pain: There were no vitals filed for this visit.    Patients Stated Pain Goal: 8 (09/06/15 0732)  Complications: No apparent anesthesia complications

## 2015-09-06 NOTE — Op Note (Signed)
[facility] Patient Name: Julie Rowland Procedure Date: 09/06/2015 8:13 AM MRN: [id number] Date of Birth: [birth year] Attending MD: Jonette Eva , MD CSN: [id number] Age: 45 Admit Type: Ambulatory Procedure:                Upper GI endoscopy Indications:              Follow-up of esophageal varices Providers:                Jonette Eva, MD, Jannett Celestine, RN, Birder Robson,                            Technician Referring MD:             Dessa Phi Medicines:                Propofol per Anesthesia Complications:            No immediate complications. Estimated Blood Loss:     Estimated blood loss was minimal. Procedure:                Pre-Anesthesia Assessment:                           - Prior to the procedure, a History and Physical                            was performed, and patient medications and                            allergies were reviewed. The patient's tolerance of                            previous anesthesia was also reviewed. The risks                            and benefits of the procedure and the sedation                            options and risks were discussed with the patient.                            All questions were answered, and informed consent                            was obtained. Prior Anticoagulants: The patient has                            taken no previous anticoagulant or antiplatelet                            agents. ASA Grade Assessment: II - A patient with                            mild systemic disease. After reviewing the risks  and benefits, the patient was deemed in                            satisfactory condition to undergo the procedure.                           After obtaining informed consent, the endoscope was                            passed under direct vision. Throughout the                            procedure, the patient's blood pressure, pulse, and   oxygen saturations were monitored continuously. The                            [id number] ([id number]) scope was introduced through the                            mouth, and advanced to the second part of duodenum.                            The upper GI endoscopy was accomplished with ease.                            The patient tolerated the procedure well. Scope In: 8:33:02 AM Scope Out: 8:43:48 AM Total Procedure Duration: 0 hours 10 minutes 46 seconds  Findings:      Grade I varices were found in the lower third of the esophagus.      Moderate portal hypertensive gastropathy was found in the cardia, in the       gastric fundus and in the gastric body.      Patchy hemorrhagic mucosa with active bleeding and with stigmata of       bleeding was found in the duodenal bulb. Coagulation for hemostasis       using bipolar probe was successful. Estimated blood loss was minimal.      The second portion of the duodenum was normal. Impression:               - Grade I esophageal varices.                           - Portal hypertensive gastropathy.                           - Hemorrhagic duodenopathy.                           - Normal second portion of the duodenum.                           - Moderate Sedation:      Per Anesthesia Care Recommendation:           - Resume previous diet.                           -  Continue present medications.                           - Repeat upper endoscopy in 6 months for                            surveillance.                           - Return to GI office in 6 months.                           TAKE CIPRO AS DIRECTED.                           CONTINUE PROTONIX, XIFAXAN, AND LACTULOSE.                           Paracentesis today.                           FOLLOW UP WITH [facility].                           FOLLOW UP IN OCTOBER 2017.                           REPEAT EGD IN OCT 2017 AFTER NEXT OPV.                           - Patient has a  contact number available for                            emergencies. The signs and symptoms of potential                            delayed complications were discussed with the                            patient. Return to normal activities tomorrow.                            Written discharge instructions were provided to the                            patient. Procedure Code(s):        --- Professional ---                           [phone], Esophagogastroduodenoscopy, flexible,                            transoral; with control of bleeding, any method Diagnosis Code(s):        --- Professional ---                           I85.00, Esophageal varices without bleeding  K76.6, Portal hypertension                           K31.89, Other diseases of stomach and duodenum                           K92.2, Gastrointestinal hemorrhage, unspecified CPT copyright 2016 American Medical Association. All rights reserved. The codes documented in this report are preliminary and upon coder review may  be revised to meet current compliance requirements. Jonette Eva, MD Jonette Eva, MD 09/06/2015 9:15:08 PM This report has been signed electronically. Number of Addenda: 0

## 2015-09-06 NOTE — Anesthesia Preprocedure Evaluation (Addendum)
Anesthesia Evaluation  Patient identified by MRN, date of birth, ID band Patient awake    Reviewed: Allergy & Precautions, NPO status , Patient's Chart, lab work & pertinent test results  Airway Mallampati: II  TM Distance: >3 FB Neck ROM: Full    Dental  (+) Teeth Intact, Dental Advisory Given   Pulmonary former smoker,    breath sounds clear to auscultation       Cardiovascular hypertension, Pt. on medications + Peripheral Vascular Disease   Rhythm:Regular Rate:Normal     Neuro/Psych  Headaches, Seizures -,  PSYCHIATRIC DISORDERS Anxiety Depression    GI/Hepatic (+) Cirrhosis   Esophageal Varices and ascites  substance abuse  alcohol use, Portal htn    Endo/Other    Renal/GU      Musculoskeletal   Abdominal (+) + obese,   Peds  Hematology   Anesthesia Other Findings   Reproductive/Obstetrics                            Anesthesia Physical Anesthesia Plan  ASA: IV  Anesthesia Plan: MAC   Post-op Pain Management:    Induction: Intravenous  Airway Management Planned: Simple Face Mask  Additional Equipment:   Intra-op Plan:   Post-operative Plan:   Informed Consent: I have reviewed the patients History and Physical, chart, labs and discussed the procedure including the risks, benefits and alternatives for the proposed anesthesia with the patient or authorized representative who has indicated his/her understanding and acceptance.     Plan Discussed with:   Anesthesia Plan Comments:        Anesthesia Quick Evaluation

## 2015-09-06 NOTE — Procedures (Signed)
PreOperative Dx: Cirrhosis, ascites Postoperative Dx: Cirrhosis, ascites Procedure:   US guided paracentesis Radiologist:  Tyron RussellBoles Anesthesia:  10 ml of1% lidocaine Specimen:  4000 ml of amber ascitic fluid EBL:   < 1 ml Complications: None

## 2015-09-06 NOTE — Anesthesia Postprocedure Evaluation (Signed)
Anesthesia Post Note  Patient: Julie Rowland  Procedure(s) Performed: Procedure(s) (LRB): ESOPHAGOGASTRODUODENOSCOPY (EGD) WITH PROPOFOL (N/A)  Patient location during evaluation: PACU Anesthesia Type: MAC Level of consciousness: awake, oriented and patient cooperative Pain management: pain level controlled Vital Signs Assessment: post-procedure vital signs reviewed and stable Respiratory status: spontaneous breathing, nonlabored ventilation and respiratory function stable Cardiovascular status: blood pressure returned to baseline Postop Assessment: no signs of nausea or vomiting Anesthetic complications: no    Last Vitals:  Filed Vitals:   09/06/15 0815 09/06/15 0820  BP: 115/59   Temp:    Resp: 12 17    Last Pain: There were no vitals filed for this visit.               Katrine Radich J

## 2015-09-06 NOTE — Discharge Instructions (Signed)
You have SMALL esophageal varices DUE TO SCARRING IN YOUR LIVER. I CAUTERIZED A PATCH OF SUPERFICIAL BLOOD VESSELS IN YOUR SMALL BOWEL.    TAKE CIPRO AS DIRECTED.  CONTINUE PROTONIX, XIFAXAN, AND LACTULOSE.  FOLLOW UP WITH [facility].  FOLLOW UP IN OCTOBER 2017.  REPEAT EGD IN OCT 2017 AFTER NEXT OPV.    UPPER ENDOSCOPY AFTER CARE Read the instructions outlined below and refer to this sheet in the next week. These discharge instructions provide you with general information on caring for yourself after you leave the hospital. While your treatment has been planned according to the most current medical practices available, unavoidable complications occasionally occur. If you have any problems or questions after discharge, call DR. [name], [phone].  ACTIVITY  You may resume your regular activity, but move at a slower pace for the next 24 hours.   Take frequent rest periods for the next 24 hours.   Walking will help get rid of the air and reduce the bloated feeling in your belly (abdomen).   No driving for 24 hours (because of the medicine (anesthesia) used during the test).   You may shower.   Do not sign any important legal documents or operate any machinery for 24 hours (because of the anesthesia used during the test).    NUTRITION  Drink plenty of fluids.   You may resume your normal diet as instructed by your doctor.   Begin with a light meal and progress to your normal diet. Heavy or fried foods are harder to digest and may make you feel sick to your stomach (nauseated).   Avoid alcoholic beverages for 24 hours or as instructed.    MEDICATIONS  You may resume your normal medications.   WHAT YOU CAN EXPECT TODAY  Some feelings of bloating in the abdomen.   Passage of more gas than usual.    IF YOU HAD A BIOPSY TAKEN DURING THE UPPER ENDOSCOPY:  Eat a soft diet IF YOU HAVE NAUSEA, BLOATING, ABDOMINAL PAIN, OR VOMITING.    FINDING OUT THE  RESULTS OF YOUR TEST Not all test results are available during your visit. DR. [name] WILL CALL YOU WITHIN 7 DAYS OF YOUR PROCEDUE WITH YOUR RESULTS. Do not assume everything is normal if you have not heard from DR. [name] IN ONE WEEK, CALL HER OFFICE AT [phone].  SEEK IMMEDIATE MEDICAL ATTENTION AND CALL THE OFFICE: [phone] IF:  You have more than a spotting of blood in your stool.   Your belly is swollen (abdominal distention).   You are nauseated or vomiting.   You have a temperature over 101F.   You have abdominal pain or discomfort that is severe or gets worse throughout the day.

## 2015-09-06 NOTE — Discharge Instructions (Signed)
Paracentesis °Paracentesis is a procedure to remove excess fluid (ascites) from the belly (abdomen). Ascites can result from certain conditions, such as infection, inflammation, abdominal injury, heart failure, chronic scarring of the liver (cirrhosis), or cancer. Ascites is removed using a needle that is inserted through the skin and tissue into the abdomen. °This procedure may be done: °· To determine the cause of the ascites. °· To relieve symptoms that are caused by the ascites, such as pain or shortness of breath. °· To see if there is bleeding after an abdominal injury. °LET YOUR HEALTH CARE PROVIDER KNOW ABOUT: °· Any allergies you have. °· All medicines you are taking, including vitamins, herbs, eye drops, creams, and over-the-counter medicines. °· Previous problems you or members of your family have had with the use of anesthetics. °· Any blood disorders you have. °· Previous surgeries you have had. °· Any medical conditions you have. °· Whether you are pregnant or may be pregnant. °RISKS AND COMPLICATIONS °Generally, this is a safe procedure. However, problems may occur, including: °· Infection. °· Bleeding. °· Injury to an abdominal organ, such as the bowel (large intestine), liver, spleen, or bladder. °· Low blood pressure (hypotension). °· Spreading of cancer, if there are cancer cells in the abdominal fluid. °· Mental status changes in people who have liver disease. These changes would be caused by shifts in the balance of fluids and minerals (electrolytes) in the body. °BEFORE THE PROCEDURE °· Ask your health care provider about: °¨ Changing or stopping your regular medicines. This is especially important if you are taking diabetes medicines or blood thinners. °¨ Taking medicines such as aspirin and ibuprofen. These medicines can thin your blood. Do not take these medicines before your procedure if your health care provider instructs you not to. °· A blood sample may be done to determine your blood  clotting time. °· You will be asked to urinate. °PROCEDURE °· You may be asked to lie on your back with your head raised (elevated). °· To reduce your risk of infection: °¨ Your health care team will wash or sanitize their hands. °¨ Your skin will be washed with soap. °· You will be given a medicine to numb the area (local anesthetic). °· Your abdominal skin will be punctured with a needle or a scalpel. °· A drainage tube will be inserted through the puncture site. Fluid will drain through the tube into a container. °· After enough fluid has been removed, the tube will be removed. °· A sample of the fluid will be sent for examination. °· A bandage (dressing) will be placed over the puncture site. °The procedure may vary among health care providers and hospitals. °AFTER THE PROCEDURE °· It is your responsibility to get your test results. Ask your health care provider or the department performing the test when your results will be ready. °  °This information is not intended to replace advice given to you by your health care provider. Make sure you discuss any questions you have with your health care provider. °  °Document Released: 10/16/2004 Document Revised: 12/22/2014 Document Reviewed: 06/15/2014 °Elsevier Interactive Patient Education ©2016 Elsevier Inc. ° °

## 2015-09-06 NOTE — Progress Notes (Signed)
Paracentesis complete no signs of distress. 4000 ml amber colored ascites removed.  

## 2015-09-06 NOTE — H&P (View-Only) (Signed)
Subjective:    Patient ID: Charlean Merl, female    DOB: September 15, 1970, 45 y.o.   MRN: [id number]  Lora Paula, MD  HPI Pt having trouble with ammonia level while on LACTULOSE QID(2.5 TBSP QID)/XIFAXAN. GOT DENIED FOR DISABILITY. HAS PAIN IN HER RIGHT SIDE & SHE WAS TAKING CARAFATE. DIDN'T HELP. TROUBLE SWALLOWING: BOTH SOLIDS AND LIQUIDS. SLEEP: HORRIBLE-DURING DAY AND NIGHT ALITTLE MORE DURING THE DAY. LIFESTYLE HAS CHANGED-WAITED TABLES & DOING PRETTY WELL. LAST ETOH: OVER YEAR. WENT TO [facility] A[facility] WHEN SHE QUIT Sep 20 2012. PUT SELF IN 15 DAY: 2009 & 30 DAY REHAB-2009. FEELS LIKE SHE NEEDS TO HAVE HER ABDOMEN TAPPED.FASTIGUE IS UP. FEELS WEAK. LOOPY/NOODLY LEGS.  TREMORS IN HANDS. CONCERNED ABOUT ASKING LESION: NON-TENDER, BROWN, FLAT.   PT DENIES FEVER, CHILLS, HEMATOCHEZIA, HEMATEMESIS, nausea, vomiting, melena, diarrhea, CHEST PAIN, SHORTNESS OF BREATH,  CHANGE IN BOWEL IN HABITS, constipation, abdominal pain, problems swallowing, problems with sedation, heartburn or indigestion.  Past Medical History  Diagnosis Date  . Migraine headache     At age 71  . Hypertension Dx 2003  . Anxiety Dx 2002  . Seizures (HCC) 2014 and 2009  . Portal hypertension (HCC)   . Ascites 10/2014.    Small volume, not enough to tap  . Hepatic encephalopathy (HCC) 02/2015.   Past Surgical History  Procedure Laterality Date  . Colonoscopy  2015    Hyperplastic polyp. Random biopsies negative for collagenous/microscopic colitis. Performed at [facility].  . Vein ligation and stripping N/A 07/14/2014    Procedure: EXCISION OF VARICOSE VEINS AT UMBILICUS;  Surgeon: Larina Earthly, MD;  Location: [facility] OR;  Service: Vascular;  Laterality: N/A;  . Umbilical hernia repair N/A 07/14/2014    Procedure: HERNIA REPAIR UMBILICAL ADULT;  Surgeon: Larina Earthly, MD;  Location: [facility] OR;  Service: Vascular;  Laterality: N/A;  . Esophagogastroduodenoscopy (egd) with propofol N/A 12/07/2014    YQM:VHQIONGE portal  hypertensive gastropathy/grade 3 varices  . Esophageal banding N/A 12/07/2014    Procedure: ESOPHAGEAL BANDING (4 bands);  Surgeon: West Bali, MD;  Location: [facility] ORS;  Service: Endoscopy;  Laterality: N/A;  . Esophagogastroduodenoscopy (egd) with propofol N/A 02/08/2015    SLF: 1. 2 columns of grad III varices 2. mild portal hypertensive gastropathy  . Gastric varices banding N/A 02/08/2015    Procedure: GASTRIC VARICES BANDING;  Surgeon: West Bali, MD;  Location: [facility] ORS;  Service: Endoscopy;  Laterality: N/A;  3 bands  . Esophagogastroduodenoscopy (egd) with propofol N/A 02/23/2015    Procedure: ESOPHAGOGASTRODUODENOSCOPY (EGD) WITH PROPOFOL;  Surgeon: Ruffin Frederick, MD;  Location[facility]COPY;  Service: Gastroenterology;  Laterality: N/A;   No Known Allergies  Current Outpatient Prescriptions  Medication Sig Dispense Refill  . folic acid (FOLVITE) 800 MCG tablet Take 1 tablet (800 mcg total) by mouth daily.    . ATARAX/VISTARIL 25 MG tablet TAKE 1 TABLET BY MOUTH 3 TIMES DAILY AS NEEDED FOR ANXIETY.    Marland Kitchen CHRONULAC 10 GM/15ML solution Take 45 mLs (30 g total) by mouth 4 (four) times daily.    Marland Kitchen KEPPRA 500 MG tablet TAKE 1/2 TABLET BY MOUTH 2 TIMES DAILY    . PROTONIX) 40 MG tablet Take 1 tablet (40 mg total) by mouth 2 (two) times daily.    Marland Kitchen      Burman Blacksmith 550 MG TABS tablet Take 1 tablet (550 mg total) by mouth 2 (two) times daily.    . sertraline (ZOLOFT) 100 MG  tablet Take 1 tablet (100 mg total) by mouth daily.    Marland Kitchen ALDACTONE 25 MG tablet Take 1 tablet (25 mg total) by mouth daily. PRN   . traMADol (ULTRAM) 50 MG tablet Take 1 tablet (50 mg total) by mouth every 12 (twelve) hours as needed.    . traZODone (DESYREL) 50 MG tablet Take 0.5-1 tablets (25-50 mg total) by mouth at bedtime as needed for sleep.    .       Review of Systems PER HPI OTHERWISE ALL SYSTEMS ARE NEGATIVE.    Objective:   Physical Exam  Constitutional: She is oriented to person, place, and  time. She appears well-developed and well-nourished. No distress.  HENT:  Head: Normocephalic and atraumatic.  Mouth/Throat: Oropharynx is clear and moist. No oropharyngeal exudate.  Eyes: Pupils are equal, round, and reactive to light. Scleral icterus is present.  Neck: Normal range of motion. Neck supple.  Cardiovascular: Normal rate, regular rhythm and normal heart sounds.   Pulmonary/Chest: Effort normal and breath sounds normal. No respiratory distress.  Abdominal: Soft. Bowel sounds are normal. She exhibits no distension. There is no tenderness.  Liver and spleen enlarged to 3 cm below costal margin  Musculoskeletal: She exhibits no edema.  Lymphadenopathy:    She has no cervical adenopathy.  Neurological: She is alert and oriented to person, place, and time.  NO  NEW FOCAL DEFICITS, asterixis  Psychiatric:  FLAT AFFECT, NL MOOD, insight intact  Vitals reviewed.     Assessment & Plan:

## 2015-09-06 NOTE — Interval H&P Note (Signed)
History and Physical Interval Note:  09/06/2015 8:08 AM  Julie Rowland  has presented today for surgery, with the diagnosis of VARICES  The various methods of treatment have been discussed with the patient and family. After consideration of risks, benefits and other options for treatment, the patient has consented to  Procedure(s) with comments: ESOPHAGOGASTRODUODENOSCOPY (EGD) WITH PROPOFOL (N/A) - 830 as a surgical intervention .  The patient's history has been reviewed, patient examined, no change in status, stable for surgery.  I have reviewed the patient's chart and labs.  Questions were answered to the patient's satisfaction.     Eaton CorporationSandi Gomer [place]

## 2015-09-06 NOTE — OR Nursing (Signed)
Patient scheduled for US Paracentesis as per order of Dr. [name] at 1:15 at [facility] radiology. Dr. [name] discussed need for procedure with patient. Patient reluctant to have procedure due to "it hurt very badly the last time it was done." Patient encouraged to have procedure before symptoms worsen.

## 2015-09-08 ENCOUNTER — Telehealth: Payer: Self-pay

## 2015-09-08 ENCOUNTER — Encounter (HOSPITAL_COMMUNITY): Payer: Self-pay | Admitting: Gastroenterology

## 2015-09-08 NOTE — Telephone Encounter (Signed)
Thank You.

## 2015-09-08 NOTE — Addendum Note (Signed)
Addended by: Dessa PhiFUNCHES, Mahrukh Seguin on: 09/08/2015 01:59 PM   Modules accepted: Orders

## 2015-09-08 NOTE — Telephone Encounter (Signed)
This Case Manager spoke with Dr. Armen PickupFunches about Medicaid not [facility]. Dr. Armen PickupFunches ordered referral for outpatient physical therapy services.  This Case Manager placed call to patient. Spoke with patient's mother, Dalbert GarnetDeborah Lee, who is listed on [employer] Release. Provided [facility] on [facility] and informed her patient referred for outpatient physical therapy services. Patient's mother indicated she had to put Nutrition referral on hold when called to schedule an appointment because it would be hard to get patient to appointment because of where Nutrition office was located in building. In addition, patient's mother indicated she is handicapped and has stairs to navigate to get out of her home so it would be hard to get patient to outpatient physical therapy appointments several times/week. She requests for outpatient physical therapy referral to be put on hold as well. She indicated she has been doing physical therapy exercises she was taught with the patient at home. Will route to Dr. Armen PickupFunches so she is aware.

## 2015-09-09 MED FILL — levETIRAcetam 500 MG TABS: 500 | 30 days supply | Qty: 30 | Fill #1

## 2015-09-09 MED FILL — LACTULOSE 10 GM/15 ML SOLN: 10 | 19 days supply | Qty: 3784 | Fill #5

## 2015-09-09 MED FILL — POTASSIUM CL ER 20 MEQ TAB: 20 | 30 days supply | Qty: 30 | Fill #1

## 2015-09-09 MED FILL — traMADol HCL 50 MG TABS: 50 | 30 days supply | Qty: 60 | Fill #0

## 2015-09-09 MED FILL — $XIFAXAN 550 MG TABLET: 550 | 30 days supply | Qty: 60 | Fill #2

## 2015-09-09 MED FILL — predniSONE 5 MG TABS: 5 | 30 days supply | Qty: 30 | Fill #1

## 2015-09-13 ENCOUNTER — Other Ambulatory Visit: Payer: Self-pay

## 2015-09-14 ENCOUNTER — Other Ambulatory Visit: Payer: Self-pay | Admitting: Family Medicine

## 2015-09-14 ENCOUNTER — Other Ambulatory Visit: Payer: Self-pay

## 2015-09-14 ENCOUNTER — Telehealth: Payer: Self-pay | Admitting: Gastroenterology

## 2015-09-14 DIAGNOSIS — F411 Generalized anxiety disorder: Secondary | ICD-10-CM

## 2015-09-14 DIAGNOSIS — R188 Other ascites: Secondary | ICD-10-CM

## 2015-09-14 MED FILL — CIPROFLOXACIN HCL 500 MG TA: 500 | 90 days supply | Qty: 45 | Fill #0

## 2015-09-14 NOTE — Telephone Encounter (Signed)
PT MAY HAVE STANDING ORDER FOR PARACENTESIS ONCE A WEEK FOR THE NEXT 6 MO. PLEASE CALL PT'S MOTHER AND LET HER KNOW SHE CAN CALL RADIOLOGY TO SCHEDULE. SHE SHOULD HOLD DIURETICS ON THE DAY OF THE PARACENTESIS. SHE NEEDS TO STRICTLY FOLLOW A LOW SALT DIET.   STANDING ORDER: SHE NEEDS ALBUMIN 25 GMS IV AT ONSET AND REPEAT AFTER 4 LS.

## 2015-09-14 NOTE — Telephone Encounter (Signed)
Called pt mother and LMOM with para appt. On 06/07 @ 0845

## 2015-09-14 NOTE — Telephone Encounter (Signed)
Routing to Dr. Fields.  

## 2015-09-14 NOTE — Telephone Encounter (Signed)
Pt's mother called asking to schedule another para for her daughter. She said it needed to be next week on either Wednesday, Thursday or Friday. Please call [phone]

## 2015-09-15 MED FILL — ?HYDROXYZINE HCL 25 MG TAB: 25 | 30 days supply | Qty: 90 | Fill #0

## 2015-09-19 ENCOUNTER — Telehealth: Payer: Self-pay

## 2015-09-19 MED ORDER — PROMETHAZINE HCL 12.5 MG PO TABS: ORAL_TABLET | ORAL | Status: AC

## 2015-09-19 MED FILL — PROMETHAZINE 12.5 MG TABLET: 12.5 | 10 days supply | Qty: 30 | Fill #0

## 2015-09-19 NOTE — Telephone Encounter (Signed)
Pt mother called and wants [facility] to call her back and states that pt is getting worse. States that she has multiple concerns and needs some advice. States that the ascites is terrible and its back so soon. States that pt is set for Para on Wednesday.   Mother is tearful and crying on the phone with staff. States that now the pt is having nausea and vomiting. She is requesting a RX for phenergan.   Mother states that pt states that " She is feeling like she is dying." Mother is crying and is wanting to speak with Dr. [name] as soon as [facility] can call her back.

## 2015-09-19 NOTE — Telephone Encounter (Signed)
Pt mother aware that [facility]F is to call her soon

## 2015-09-19 NOTE — Telephone Encounter (Addendum)
PLEASE CALL PT'S FAMILY. I WILL CALL HER BACK IN ONE HOUR, BUT IF THE PT IS REALLY ILL SHE HSOULD GO TO THE NEAREST ED OR IF SHE THINKS SHE CAN MAKE IT BACK TO [facility] TAKE HER THERE. RX FOR PHENERGAN SENT.

## 2015-09-20 ENCOUNTER — Telehealth: Payer: Self-pay | Admitting: Gastroenterology

## 2015-09-20 MED FILL — traZODone HCL 50 MG TABS: 50 | 30 days supply | Qty: 30 | Fill #1

## 2015-09-20 MED FILL — ?SPIRONOLACTONE 25 MG TABLE: 25 | 30 days supply | Qty: 30 | Fill #3

## 2015-09-20 MED FILL — ?PANTOPRAZOLE SOD DR 40MG: 40 MG | 30 days supply | Qty: 60 | Fill #0

## 2015-09-20 MED FILL — ?SERTRALINE HCL 100 MG TAB: 100 | 30 days supply | Qty: 30 | Fill #4

## 2015-09-20 NOTE — Telephone Encounter (Signed)
PATIENT MOTHER CALLED AND STATED THAT ON June 22 SHE HAS AN APPOINTMENT FOR LIVER TRANSPLANT.  SHE WANTED TO THANK YOU FOR YOUR CARE

## 2015-09-21 ENCOUNTER — Encounter (HOSPITAL_COMMUNITY): Payer: Self-pay

## 2015-09-21 ENCOUNTER — Ambulatory Visit (HOSPITAL_COMMUNITY)
Admission: RE | Admit: 2015-09-21 | Discharge: 2015-09-21 | Disposition: A | Discharge status: Home / Self Care | Payer: Medicaid Other | Source: Ambulatory Visit | Attending: Gastroenterology | Admitting: Gastroenterology

## 2015-09-21 DIAGNOSIS — R188 Other ascites: Secondary | ICD-10-CM | POA: Insufficient documentation

## 2015-09-21 DIAGNOSIS — K7460 Unspecified cirrhosis of liver: Secondary | ICD-10-CM | POA: Insufficient documentation

## 2015-09-21 MED ORDER — ALBUMIN HUMAN 25 % IV SOLN
25.0000 g | Freq: Once | INTRAVENOUS | Status: AC
  Administered 2015-09-21: 25 g via INTRAVENOUS

## 2015-09-21 MED ORDER — ALBUMIN HUMAN 25 % IV SOLN
INTRAVENOUS | Status: AC
  Administered 2015-09-21: 25 g via INTRAVENOUS
  Filled 2015-09-21: qty 100

## 2015-09-21 NOTE — Progress Notes (Signed)
Paracentesis complete no signs of distress. 4700 ml yellow colored ascites removed.  

## 2015-09-21 NOTE — Procedures (Signed)
PreOperative Dx: Cirrhosis, ascites Postoperative Dx: Cirrhosis, ascites Procedure:   US guided paracentesis Radiologist:  Tyron RussellBoles Anesthesia:  10 ml of1% lidocaine Specimen:  4.7 L of amber colored ascitic fluid EBL:   < 1 ml Complications: None

## 2015-09-22 NOTE — Telephone Encounter (Signed)
REVIEWED-NO ADDITIONAL RECOMMENDATIONS. 

## 2015-09-23 ENCOUNTER — Encounter (HOSPITAL_COMMUNITY): Payer: Self-pay | Admitting: Emergency Medicine

## 2015-09-23 ENCOUNTER — Inpatient Hospital Stay (HOSPITAL_COMMUNITY)
Admission: EM | Admit: 2015-09-23 | Discharge: 2015-10-15 | DRG: 432 | Disposition: E | Payer: Medicaid Other | Attending: Oncology | Admitting: Oncology

## 2015-09-23 ENCOUNTER — Inpatient Hospital Stay (HOSPITAL_COMMUNITY): Payer: Medicaid Other

## 2015-09-23 ENCOUNTER — Telehealth: Payer: Self-pay | Admitting: Gastroenterology

## 2015-09-23 ENCOUNTER — Emergency Department (HOSPITAL_COMMUNITY): Payer: Medicaid Other

## 2015-09-23 DIAGNOSIS — Z87891 Personal history of nicotine dependence: Secondary | ICD-10-CM

## 2015-09-23 DIAGNOSIS — D638 Anemia in other chronic diseases classified elsewhere: Secondary | ICD-10-CM | POA: Diagnosis present

## 2015-09-23 DIAGNOSIS — I129 Hypertensive chronic kidney disease with stage 1 through stage 4 chronic kidney disease, or unspecified chronic kidney disease: Secondary | ICD-10-CM | POA: Diagnosis present

## 2015-09-23 DIAGNOSIS — E872 Acidosis, unspecified: Secondary | ICD-10-CM

## 2015-09-23 DIAGNOSIS — E871 Hypo-osmolality and hyponatremia: Secondary | ICD-10-CM | POA: Diagnosis present

## 2015-09-23 DIAGNOSIS — G9340 Encephalopathy, unspecified: Secondary | ICD-10-CM

## 2015-09-23 DIAGNOSIS — A419 Sepsis, unspecified organism: Secondary | ICD-10-CM | POA: Diagnosis not present

## 2015-09-23 DIAGNOSIS — R571 Hypovolemic shock: Secondary | ICD-10-CM | POA: Diagnosis present

## 2015-09-23 DIAGNOSIS — Z66 Do not resuscitate: Secondary | ICD-10-CM | POA: Diagnosis present

## 2015-09-23 DIAGNOSIS — K3189 Other diseases of stomach and duodenum: Secondary | ICD-10-CM | POA: Diagnosis present

## 2015-09-23 DIAGNOSIS — N179 Acute kidney failure, unspecified: Secondary | ICD-10-CM | POA: Diagnosis present

## 2015-09-23 DIAGNOSIS — D649 Anemia, unspecified: Secondary | ICD-10-CM

## 2015-09-23 DIAGNOSIS — Z515 Encounter for palliative care: Secondary | ICD-10-CM | POA: Diagnosis present

## 2015-09-23 DIAGNOSIS — Z8249 Family history of ischemic heart disease and other diseases of the circulatory system: Secondary | ICD-10-CM

## 2015-09-23 DIAGNOSIS — R6520 Severe sepsis without septic shock: Secondary | ICD-10-CM | POA: Diagnosis not present

## 2015-09-23 DIAGNOSIS — J189 Pneumonia, unspecified organism: Secondary | ICD-10-CM

## 2015-09-23 DIAGNOSIS — I8510 Secondary esophageal varices without bleeding: Secondary | ICD-10-CM | POA: Diagnosis present

## 2015-09-23 DIAGNOSIS — F1021 Alcohol dependence, in remission: Secondary | ICD-10-CM

## 2015-09-23 DIAGNOSIS — K7040 Alcoholic hepatic failure without coma: Principal | ICD-10-CM | POA: Diagnosis present

## 2015-09-23 DIAGNOSIS — K767 Hepatorenal syndrome: Secondary | ICD-10-CM | POA: Diagnosis present

## 2015-09-23 DIAGNOSIS — R109 Unspecified abdominal pain: Secondary | ICD-10-CM

## 2015-09-23 DIAGNOSIS — R69 Illness, unspecified: Secondary | ICD-10-CM

## 2015-09-23 DIAGNOSIS — IMO0002 Reserved for concepts with insufficient information to code with codable children: Secondary | ICD-10-CM | POA: Diagnosis present

## 2015-09-23 DIAGNOSIS — N19 Unspecified kidney failure: Secondary | ICD-10-CM

## 2015-09-23 DIAGNOSIS — J9601 Acute respiratory failure with hypoxia: Secondary | ICD-10-CM | POA: Diagnosis present

## 2015-09-23 DIAGNOSIS — K652 Spontaneous bacterial peritonitis: Secondary | ICD-10-CM | POA: Diagnosis not present

## 2015-09-23 DIAGNOSIS — D696 Thrombocytopenia, unspecified: Secondary | ICD-10-CM | POA: Diagnosis present

## 2015-09-23 DIAGNOSIS — R6521 Severe sepsis with septic shock: Secondary | ICD-10-CM | POA: Clinically undetermined

## 2015-09-23 DIAGNOSIS — E860 Dehydration: Secondary | ICD-10-CM | POA: Diagnosis present

## 2015-09-23 DIAGNOSIS — K7030 Alcoholic cirrhosis of liver without ascites: Secondary | ICD-10-CM

## 2015-09-23 DIAGNOSIS — F419 Anxiety disorder, unspecified: Secondary | ICD-10-CM | POA: Diagnosis present

## 2015-09-23 DIAGNOSIS — N182 Chronic kidney disease, stage 2 (mild): Secondary | ICD-10-CM | POA: Diagnosis present

## 2015-09-23 DIAGNOSIS — E46 Unspecified protein-calorie malnutrition: Secondary | ICD-10-CM | POA: Diagnosis present

## 2015-09-23 DIAGNOSIS — K766 Portal hypertension: Secondary | ICD-10-CM | POA: Diagnosis present

## 2015-09-23 DIAGNOSIS — D684 Acquired coagulation factor deficiency: Secondary | ICD-10-CM | POA: Diagnosis present

## 2015-09-23 DIAGNOSIS — K7031 Alcoholic cirrhosis of liver with ascites: Secondary | ICD-10-CM | POA: Insufficient documentation

## 2015-09-23 LAB — I-STAT ARTERIAL BLOOD GAS, ED
Acid-base deficit: 22 mmol/L — ABNORMAL HIGH (ref 0.0–2.0)
Bicarbonate: 7.2 mEq/L — ABNORMAL LOW (ref 20.0–24.0)
O2 Saturation: 93 %
TCO2: 8 mmol/L (ref 0–100)
pCO2 arterial: 26.3 mmHg — ABNORMAL LOW (ref 35.0–45.0)
pH, Arterial: 7.046 — CL (ref 7.350–7.450)
pO2, Arterial: 94 mmHg (ref 80.0–100.0)

## 2015-09-23 LAB — URINALYSIS, ROUTINE W REFLEX MICROSCOPIC
Bilirubin Urine: NEGATIVE
Glucose, UA: NEGATIVE mg/dL
Ketones, ur: NEGATIVE mg/dL
Nitrite: NEGATIVE
Protein, ur: NEGATIVE mg/dL
Specific Gravity, Urine: 1.017 (ref 1.005–1.030)
pH: 5 (ref 5.0–8.0)

## 2015-09-23 LAB — I-STAT CG4 LACTIC ACID, ED
Lactic Acid, Venous: 6.22 mmol/L (ref 0.5–2.0)
Lactic Acid, Venous: 6.56 mmol/L (ref 0.5–2.0)

## 2015-09-23 LAB — RAPID URINE DRUG SCREEN, HOSP PERFORMED
Amphetamines: NOT DETECTED
Barbiturates: NOT DETECTED
Benzodiazepines: NOT DETECTED
Cocaine: NOT DETECTED
Opiates: NOT DETECTED
Tetrahydrocannabinol: NOT DETECTED

## 2015-09-23 LAB — PROTIME-INR
INR: 2.69 — AB (ref 0.00–1.49)
PROTHROMBIN TIME: 28.2 s — AB (ref 11.6–15.2)

## 2015-09-23 LAB — COMPREHENSIVE METABOLIC PANEL
ALT: 35 U/L (ref 14–54)
AST: 63 U/L — ABNORMAL HIGH (ref 15–41)
Albumin: 2.4 g/dL — ABNORMAL LOW (ref 3.5–5.0)
Alkaline Phosphatase: 101 U/L (ref 38–126)
Anion gap: 22 — ABNORMAL HIGH (ref 5–15)
BUN: 83 mg/dL — ABNORMAL HIGH (ref 6–20)
CO2: 8 mmol/L — ABNORMAL LOW (ref 22–32)
Calcium: 8.3 mg/dL — ABNORMAL LOW (ref 8.9–10.3)
Chloride: 103 mmol/L (ref 101–111)
Creatinine, Ser: 8.63 mg/dL — ABNORMAL HIGH (ref 0.44–1.00)
GFR calc Af Amer: 6 mL/min — ABNORMAL LOW (ref >60)
GFR calc non Af Amer: 5 mL/min — ABNORMAL LOW (ref >60)
Glucose, Bld: 99 mg/dL (ref 65–99)
Potassium: 3.7 mmol/L (ref 3.5–5.1)
Sodium: 133 mmol/L — ABNORMAL LOW (ref 135–145)
Total Bilirubin: 5.6 mg/dL — ABNORMAL HIGH (ref 0.3–1.2)
Total Protein: 6.8 g/dL (ref 6.5–8.1)

## 2015-09-23 LAB — URINE MICROSCOPIC-ADD ON
Bacteria, UA: NONE SEEN
RBC / HPF: NONE SEEN RBC/hpf (ref 0–5)

## 2015-09-23 LAB — CBC
HCT: 20.3 % — ABNORMAL LOW (ref 36.0–46.0)
Hemoglobin: 6.9 g/dL — CL (ref 12.0–15.0)
MCH: 28.9 pg (ref 26.0–34.0)
MCHC: 34 g/dL (ref 30.0–36.0)
MCV: 84.9 fL (ref 78.0–100.0)
Platelets: 100 10*3/uL — ABNORMAL LOW (ref 150–400)
RBC: 2.39 MIL/uL — ABNORMAL LOW (ref 3.87–5.11)
RDW: 17.7 % — ABNORMAL HIGH (ref 11.5–15.5)
WBC: 25 10*3/uL — ABNORMAL HIGH (ref 4.0–10.5)

## 2015-09-23 LAB — TYPE AND SCREEN
ABO/RH(D): AB POS
Antibody Screen: NEGATIVE

## 2015-09-23 LAB — CBG MONITORING, ED: Glucose-Capillary: 93 mg/dL (ref 65–99)

## 2015-09-23 LAB — AMMONIA: Ammonia: 130 umol/L — ABNORMAL HIGH (ref 9–35)

## 2015-09-23 LAB — PROCALCITONIN: Procalcitonin: 1.17 ng/mL

## 2015-09-23 LAB — APTT: APTT: 51 s — AB (ref 24–37)

## 2015-09-23 MED ORDER — SODIUM CHLORIDE 0.9 % IV SOLN
500.0000 mg | Freq: Once | INTRAVENOUS | Status: AC
  Administered 2015-09-23: 500 mg via INTRAVENOUS
  Filled 2015-09-23: qty 5

## 2015-09-23 MED ORDER — MORPHINE SULFATE (PF) 2 MG/ML IV SOLN
INTRAVENOUS | Status: AC
  Filled 2015-09-23: qty 1

## 2015-09-23 MED ORDER — ALBUMIN HUMAN 25 % IV SOLN
25.0000 g | Freq: Once | INTRAVENOUS | Status: AC
  Administered 2015-09-23: 25 g via INTRAVENOUS
  Filled 2015-09-23: qty 100

## 2015-09-23 MED ORDER — THIAMINE HCL 100 MG/ML IJ SOLN: 100.0000 mg | Freq: Every day | INTRAMUSCULAR | Status: DC

## 2015-09-23 MED ORDER — LORAZEPAM 2 MG/ML IJ SOLN
0.5000 mg | Freq: Once | INTRAMUSCULAR | Status: AC
  Administered 2015-09-23: 0.5 mg via INTRAVENOUS
  Filled 2015-09-23: qty 1

## 2015-09-23 MED ORDER — LORAZEPAM 2 MG/ML IJ SOLN: 2.0000 mg | INTRAMUSCULAR | Status: DC | PRN

## 2015-09-23 MED ORDER — FOLIC ACID 5 MG/ML IJ SOLN: 1.0000 mg | Freq: Every day | INTRAMUSCULAR | Status: DC

## 2015-09-23 MED ORDER — MIDAZOLAM HCL 2 MG/2ML IJ SOLN
1.0000 mg | Freq: Once | INTRAMUSCULAR | Status: AC
  Administered 2015-09-23: 1 mg via INTRAVENOUS

## 2015-09-23 MED ORDER — MORPHINE SULFATE (PF) 2 MG/ML IV SOLN
2.0000 mg | INTRAVENOUS | Status: AC
  Administered 2015-09-23: 2 mg via INTRAVENOUS
  Filled 2015-09-23: qty 1

## 2015-09-23 MED ORDER — SODIUM CHLORIDE 0.9 % IV SOLN
250.0000 mL | INTRAVENOUS | Status: DC | PRN
  Administered 2015-09-23: 250 mL via INTRAVENOUS

## 2015-09-23 MED ORDER — LORAZEPAM 2 MG/ML IJ SOLN
2.0000 mg | INTRAMUSCULAR | Status: DC | PRN
  Administered 2015-09-23: 2 mg via INTRAVENOUS
  Filled 2015-09-23: qty 1

## 2015-09-23 MED ORDER — MIDAZOLAM HCL 2 MG/2ML IJ SOLN
INTRAMUSCULAR | Status: AC
  Filled 2015-09-23: qty 2

## 2015-09-23 MED ORDER — MORPHINE SULFATE 25 MG/ML IV SOLN: 2.0000 mg/h | INTRAVENOUS | Status: DC

## 2015-09-23 MED ORDER — SODIUM CHLORIDE 0.9 % IV SOLN
5.0000 mg/h | INTRAVENOUS | Status: DC
  Administered 2015-09-23: 5 mg/h via INTRAVENOUS
  Filled 2015-09-23: qty 10

## 2015-09-23 MED ORDER — SODIUM CHLORIDE 0.9 % IV BOLUS (SEPSIS)
1500.0000 mL | Freq: Once | INTRAVENOUS | Status: AC
  Administered 2015-09-23: 1500 mL via INTRAVENOUS

## 2015-09-23 MED ORDER — PIPERACILLIN-TAZOBACTAM IN DEX 2-0.25 GM/50ML IV SOLN
2.2500 g | Freq: Three times a day (TID) | INTRAVENOUS | Status: DC
  Administered 2015-09-23: 2.25 g via INTRAVENOUS
  Filled 2015-09-23 (×3): qty 50

## 2015-09-23 MED ORDER — SODIUM CHLORIDE 0.9 % IV BOLUS (SEPSIS)
1000.0000 mL | Freq: Once | INTRAVENOUS | Status: AC
Start: 2015-09-23 — End: 2015-09-23
  Administered 2015-09-23: 1000 mL via INTRAVENOUS

## 2015-09-23 MED ORDER — MORPHINE SULFATE (PF) 2 MG/ML IV SOLN
2.0000 mg | INTRAVENOUS | Status: AC
  Administered 2015-09-23: 2 mg via INTRAVENOUS

## 2015-09-23 MED ORDER — VANCOMYCIN HCL 10 G IV SOLR
1500.0000 mg | Freq: Once | INTRAVENOUS | Status: AC
  Administered 2015-09-23: 1500 mg via INTRAVENOUS
  Filled 2015-09-23: qty 1500

## 2015-09-24 NOTE — Progress Notes (Signed)
Wasted 260cc of morphine drip with rapid response nurse Puja in sink.

## 2015-09-24 NOTE — Progress Notes (Signed)
Deceased patient found by RN. Death pronounced by 2 RNs after absence of heart beat and breath sounds via auscultation. Patient's mother and father notified. MD notified. Family arrived to patient's bedside. Patient transferred from bed unto stretcher and transported to the morgue. Father states he will contact bed placement with funeral home information on 09/24/15.

## 2015-09-26 ENCOUNTER — Ambulatory Visit: Payer: Medicaid Other | Admitting: Physical Therapy

## 2015-09-26 ENCOUNTER — Telehealth: Payer: Self-pay | Admitting: Gastroenterology

## 2015-09-26 ENCOUNTER — Telehealth: Payer: Self-pay | Admitting: Family Medicine

## 2015-09-26 NOTE — Telephone Encounter (Signed)
Called patient's mother. Left VM with my deepest condolences. I will truly miss Elon JesterMichele. Will mail sympathy card.  Informed her that I will look out for death certificate.

## 2015-09-26 NOTE — Telephone Encounter (Signed)
REVIEWED-NO ADDITIONAL RECOMMENDATIONS. 

## 2015-09-26 NOTE — Telephone Encounter (Signed)
Pt's mother called this morning to let us know that Julie Rowland died on Friday. She said that she couldn't say enough good things about SF and how much the patient loved her. I have mailed a sympathy card to the family

## 2015-09-27 ENCOUNTER — Telehealth: Payer: Self-pay

## 2015-09-27 NOTE — Telephone Encounter (Signed)
On 09/27/2015 I received a death certificate from [employer] (original). The death certificate is for cremation. The patient is a patient of Doctor Byrum. The [facility] ([facility] for signature. On 09/29/2015 I received the death certificate back from Doctor Byrum. I got the death certificate ready and faxed the death certificate over to the funeral home per the funeral home request.

## 2015-09-27 NOTE — Telephone Encounter (Signed)
Noted  

## 2015-09-28 LAB — CULTURE, BLOOD (ROUTINE X 2)
Culture: NO GROWTH
Culture: NO GROWTH

## 2015-09-30 ENCOUNTER — Ambulatory Visit: Payer: Medicaid Other | Admitting: Family Medicine

## 2015-10-02 NOTE — Telephone Encounter (Signed)
Noted  

## 2015-10-07 NOTE — Discharge Summary (Signed)
PULMONARY / CRITICAL CARE MEDICINE DEATH SUMMARY   Name: Julie Rowland MRN: [id number] DOB: 01/13/1971    ADMISSION DATE:  10/09/2015 CONSULTATION DATE:  10/10/2015 DATE OF DEATH: 09/24/2015  FINAL CAUSE OF DEATH Hepatic encephalopathy  SECONDARY CAUSES OF DEATH:  Cirrhosis, both due to alcohol and NASH Acute on chronic renal failure, hepatorenal syndrome Hypovolemic shock, possible septic shock Acute respiratory failure due to altered mental status and failed airway protection Severe lactic/metabolic acidosis Hepatic coagulopathy Portal hypertension Ascites History of esophageal varices History of seizures, due to alcohol withdrawal Hyperammonemia Protein calorie malnutrition Mild transaminitis Hyperbilirubinemia Thrombocytopenia Hyponatremia Anemia of chronic disease.  History of hypertension History of migraine headaches  HISTORY OF PRESENT ILLNESS / Hospital Course:   45 year old female with PMH of NASH/alcoholic cirrhosis complicated by portal HTN, esophageal varices, hepatic encephalopathy, coagulopathy, CKD, Grand Mal seizures (from alcohol withdrawal, last Sz in 03/2015) who presented 6/9 with worsening encephalopathy. Of note she was [facility] in May [facility] for [facility]. Since d/c family noted she has been very weak w/ poor appetite and decreased functional status  She followed with gastroenterology, Dr. Jonette EvaSandi Fields, who referred the patient for U/S guided paracentesis on 6/7. She returned home weak which family was told to expect. However over the following days she was not able to eat, drink and became progressively weaker. The am of 6/9 her mother called EMS because of increased confusion and abd pain. She presented to the ED where on evaluation she was found to have: AKI, severe metabolic acidosis, lactic acidosis and an ammonia of 130. She was encephalopathic but was able to report abd pain. She was initially to be admitted to medicine full DNR, but her  mother reversed this "in a moment of panic because her husband was not there".  Because of this PCCM was asked to eval for ICU admission. In further discussion regarding the patient's underlying disease, multiorgan dysfunction, and poor prognosis for any significant meaningful recovery, decision was made that the patient would not benefit from intubation or aggressive ICU care. The patient's family agreed with this assessment and we instead focused on ensuring the patient's comfort. She was started on IV narcotics to manage significant abdominal pain. She was admitted to a regular medical floor for palliative treatment. Her pain was better controlled. She expired in the early morning of 09/24/15.    Levy Pupaobert Stetson Pelaez, MD, PhD 10/07/2015, 8:18 AM [facility] [phone] or if no answer [phone]

## 2015-10-15 NOTE — ED Notes (Signed)
Critical Care at bedside.  

## 2015-10-15 NOTE — Consult Note (Signed)
Pharmacy Antibiotic Note  Julie Rowland is a 45 y.o. female admitted on 10/01/2015 with increased AMS. She has stage 4 liver disease and had paracentesis done yesterday.  CXR shows alveolar edema versus pneumonia. Pharmacy has been consulted for vancomycin and zosyn dosing. She has AKI on CKD with sCr 8.63. Previous peak 7.09 back in December 2016. She was seen by renal then and AKI felt to be d/t dehydration. Resolved with volume repletion. Baseline sCr appears to be 2.1-2.4.   Goal vancomycin trough 15-20  Plan: 1) Vancomycin 1500mg  IV x 1 2) Zosyn 2.25g IV q8 3) Follow up sCr, renal plan for further vancomycin dosing  Weight: 192 lb 14.4 oz (87.5 kg)  Temp (24hrs), Avg:97.1 F (36.2 C), Min:96.7 F (35.9 C), Max:97.5 F (36.4 C)   Recent Labs Lab 23-Aug-2015 0952  WBC 25.0*  CREATININE 8.63*    Estimated Creatinine Clearance: 9.7 mL/min (by C-G formula based on Cr of 8.63).    No Known Allergies  Antimicrobials this admission: 6/8 Vancomycin >> 6/8 Zosyn >>  Dose adjustments this admission: n/a  Microbiology results: n/a  Thank you for allowing pharmacy to be a part of this patient's care.  Fredrik RiggerMarkle, Julie Rowland 09/22/2015 11:33 AM

## 2015-10-15 NOTE — ED Notes (Signed)
Ultrasound contacted this RN stating that patient was unable to tolerate paracentesis at this time.

## 2015-10-15 NOTE — H&P (Signed)
PULMONARY / CRITICAL CARE MEDICINE   Name: Julie Rowland MRN: [id number] DOB: 08/12/1970    ADMISSION DATE:  11-Oct-2015 CONSULTATION DATE:  6/9  REFERRING MD:  Marlena Clipper   CHIEF COMPLAINT:  MODS in setting of ESLD  HISTORY OF PRESENT ILLNESS:   45 year old female with PMH of NASH/alcoholic cirrhosis complicated by portal HTN, esophageal varices, hepatic encephalopathy, coagulopathy, CKD, Grand Mal seizures (from alcohol withdrawal, last Sz in 03/2015) who presents with worsening encephalopathy. Of note she was just hospitalized in May for same thing at [facility]. Since d/c family notes she has been very weak w/ poor appetite and decreased functional status  She follows with gastroenterology, Dr. Jonette Eva, who referred the patient for U/S guided paracentesis on 6/7. She returned home weak which family was told to expect. However over the following days she was not able to eat, drink and became progressively weaker. The am of 6/9 her mother called EMS as because of increased confusion and abd pain. She presented to the ED where on evaluation she was found to have: AKI, severe metabolic acidosis, lactic acidosis and an ammonia of 130. She was encephalopathic but was able to report abd pain. She was initially to be admitted to medicine full DNR but her mother reversed this "in a moment of panic because her husband was not there".  Because of this PCCM was asked to eval for ICU admission.   PAST MEDICAL HISTORY :  She  has a past medical history of Migraine headache; Hypertension (Dx 2003); Anxiety (Dx 2002); Seizures (HCC) (2014 and 2009); Portal hypertension (HCC); Ascites (10/2014.); and Hepatic encephalopathy (HCC) (02/2015.).  PAST SURGICAL HISTORY: She  has past surgical history that includes Colonoscopy (2015); Vein ligation and stripping (N/A, 07/14/2014); Umbilical hernia repair (N/A, 07/14/2014); Esophagogastroduodenoscopy (egd) with propofol (N/A, 12/07/2014); esophageal banding (N/A,  12/07/2014); Esophagogastroduodenoscopy (egd) with propofol (N/A, 02/08/2015); gastric varices banding (N/A, 02/08/2015); Esophagogastroduodenoscopy (egd) with propofol (N/A, 02/23/2015); and Esophagogastroduodenoscopy (egd) with propofol (N/A, 09/06/2015).  No Known Allergies  No current facility-administered medications on file prior to encounter.   Current Outpatient Prescriptions on File Prior to Encounter  Medication Sig  . ciprofloxacin (CIPRO) 500 MG tablet Take 0.5 tablets (250 mg total) by mouth daily with breakfast. For SBP prophylaxis in setting of cirrhosis  . folic acid (FOLVITE) 800 MCG tablet Take 1 tablet (800 mcg total) by mouth daily.  . hydrOXYzine (ATARAX/VISTARIL) 25 MG tablet TAKE 1 TABLET BY MOUTH 3 TIMES DAILY AS NEEDED FOR ANXIETY.  Marland Kitchen lactulose (CHRONULAC) 10 GM/15ML solution Take 45 mLs (30 g total) by mouth 4 (four) times daily.  Marland Kitchen levETIRAcetam (KEPPRA) 500 MG tablet Take 250 mg (1/2 tablet) twice daily  . pantoprazole (PROTONIX) 40 MG tablet TAKE 1 TABLET BY MOUTH 2 TIMES DAILY.  Marland Kitchen promethazine (PHENERGAN) 12.5 MG tablet 1/2-1 po q8h prn nausea or vomiting  . promethazine (PHENERGAN) 25 MG tablet Take 1 tablet (25 mg total) by mouth every 6 (six) hours as needed for nausea.  . rifaximin (XIFAXAN) 550 MG TABS tablet Take 1 tablet (550 mg total) by mouth 2 (two) times daily.  . sertraline (ZOLOFT) 100 MG tablet Take 1 tablet (100 mg total) by mouth daily.  . traMADol (ULTRAM) 50 MG tablet Take 1 tablet (50 mg total) by mouth every 12 (twelve) hours as needed.  . zinc sulfate 220 (50 Zn) MG capsule Take 1 capsule (220 mg total) by mouth daily.    FAMILY HISTORY:  Her indicated that her mother  is alive. She indicated that her father is alive.   SOCIAL HISTORY: She  reports that she quit smoking about 13 months ago. Her smoking use included Cigarettes. She quit after 20 years of use. She has never used smokeless tobacco. She reports that she drinks alcohol. She reports  that she does not use illicit drugs.  REVIEW OF SYSTEMS:   Unable   SUBJECTIVE:  Marked pain   VITAL SIGNS: BP 111/57 mmHg  Pulse 104  Temp(Src) 96.7 F (35.9 C) (Rectal)  Resp 19  Wt 192 lb 14.4 oz (87.5 kg)  SpO2 94%  HEMODYNAMICS:    VENTILATOR SETTINGS:    INTAKE / OUTPUT:    PHYSICAL EXAMINATION: General:  Appears uncomfortable, writhing in bed.  Neuro:  Moaning. Not verbal. Moves all ext. Won't follow commands  HEENT:  Sclera are icteric, MM dry  Cardiovascular:  Rrr, no MRG  Lungs:  + accessory use other wise clear  Abdomen:  Distended, firm, hypoactive  Musculoskeletal:  Equal st and bulk  Skin:  Warm, LE edema   LABS:  BMET  Recent Labs Lab 09-27-15 0952  NA 133*  K 3.7  CL 103  CO2 8*  BUN 83*  CREATININE 8.63*  GLUCOSE 99    Electrolytes  Recent Labs Lab 09/27/15 0952  CALCIUM 8.3*    CBC  Recent Labs Lab Sep 27, 2015 0952  WBC 25.0*  HGB 6.9*  HCT 20.3*  PLT 100*    Coag's  Recent Labs Lab 09/27/2015 1250  APTT 51*  INR 2.69*    Sepsis Markers  Recent Labs Lab Sep 27, 2015 1209 Sep 27, 2015 1250  LATICACIDVEN 6.22*  --   PROCALCITON  --  1.17    ABG  Recent Labs Lab 09-27-15 1157  PHART 7.046*  PCO2ART 26.3*  PO2ART 94.0    Liver Enzymes  Recent Labs Lab September 27, 2015 0952  AST 63*  ALT 35  ALKPHOS 101  BILITOT 5.6*  ALBUMIN 2.4*    Cardiac Enzymes No results for input(s): TROPONINI, PROBNP in the last 168 hours.  Glucose  Recent Labs Lab Sep 27, 2015 0946  GLUCAP 93    Imaging Dg Chest Portable 1 View  Sep 27, 2015  CLINICAL DATA:  Hypoxia with shortness of breath. Encephalopathy. Chronic renal failure. EXAM: PORTABLE CHEST 1 VIEW COMPARISON:  March 22, 2015 FINDINGS: There is alveolar opacity throughout the lungs bilaterally. Heart is mildly enlarged with pulmonary venous hypertension. No adenopathy evident. IMPRESSION: Findings felt to represent congestive heart failure, although given the overall  clinical history, there may be noncardiogenic alveolar pulmonary edema. Diffuse alveolar opacity is likely due to alveolar edema. It should be noted that pneumonia superimposed cannot be excluded. A degree of ARDS superimposed is also possible. More than one of these entities may exist concurrently. Electronically Signed   By: Bretta Bang III M.D.   On: 27-Sep-2015 10:30    SIGNIFICANT EVENTS: 6/9 admitted for palliative care   ASSESSMENT / PLAN:  Acute Respiratory Failure  AGMA / Lactic Acidosis Hyponatremia   Acute on chronic renal failure  Hepatorenal Failure  Cirrhosis/ESLD- prior ETOH abuse, last drink > 1 year ago, MELD score 37 on admit.  On xifaxan, spironolactone, lactulose pta. Grade III Varices, mild portal hypertensive gastropathy Ascites - last para 6/7 at [facility] Anemia  Thrombocytopenia  Coagulopathy - in setting of cirrhosis SIRS - possible SBP Hepatic Encephalopathy   Ms Manfra is actively dying. She has end-stage liver disease and presents again in MODS (was just admitted at [facility] in May). Our  options are to intubate, place nasogastric tube, provide lactulose and xifaximin, obtain diagnostic para and empiric antibiotics. Unfortunately the risk of this happening again is very high should she survive this, and she is no longer a transplant candidate. Alternatively our other option is to transition to comfort. Had a long discussion w/ her mother Stanton Kidney and her father Trinna Post who were at the bedside. The know that she has a terminal disease, they know that her chances for survival are low and they are deeply concerned about her comfort. We talked about the uncertainty of aggressive care and the high probability of recurrent disease even if we could successfully reverse the current situation. They both confirmed that this was their concern as well. Because of this her family has requested full DNR and transition to comfort.   Plan Admit to medsurg Morphine now and start gtt.   No labs Continue palliative focus Doubt she will survive the evening.  Dr Patsy Lager service will assume care as of 6/10  Direct time at bedside 40 minutes  Simonne Martinet ACNP-BC [facility] Pulmonary/Critical Care Pager # [phone] OR # [phone] if no answer  10-21-2015, 3:59 PM   Attending Note:  I have examined patient, reviewed labs, studies and notes. I have discussed the case with Kreg Shropshire, and I agree with the data and plans as amended above.   45 yo chronically ill woman, hx cirrhosis with all of the expected comorbid conditions. Presents with altered MS, hepatic encephalopathy, acute on chronic renal failure, lactic acidosis. On my eval she is in distress, confused, agitated. Moderate resp distress. She has been progressing. Family knows that she has worsened. She now has MODS and I do not believe this is survivable. We have discussed goals of care with her family. All agree that we should transition her to comfort based care at this time. We will admit for comfort now, ask IMTS to resume her care if she survives the night  Independent critical care time is 35 minutes.   Levy Pupa, MD, PhD 10-21-2015, 4:53 PM [place] Pulmonary and Critical Care [phone] or if no answer [phone]

## 2015-10-15 NOTE — ED Notes (Signed)
MD at bedside. 

## 2015-10-15 NOTE — ED Notes (Signed)
Pt arrives via [facility] from home, ems reports they were called out by patient's mother for increased abdominal pain and increased AMS, ems reports pt is somewhat altered with slurred speech at baseline. Ems reports pt had paracentesis done yesterday. Hx of stage 4 liver disease.

## 2015-10-15 NOTE — ED Notes (Signed)
MD's at bedside advised patient is okay to go for paracentesis when ultrasound is ready for her

## 2015-10-15 NOTE — ED Notes (Signed)
Patient transported to Ultrasound 

## 2015-10-15 NOTE — ED Notes (Signed)
Admitting physicians at bedside.

## 2015-10-15 NOTE — Progress Notes (Signed)
eLink Physician-Brief Progress Note Patient Name: Charlean MerlMichelle L Fels DOB: 03/09/1971 MRN: [id number]   Date of Service  10/04/2015  HPI/Events of Note  Agitated - patient is now comfort care.   eICU Interventions  Will order: 1. Change Morphine IV infusion to allow titration.  2. Increase Versed to 2 mg Q 1 hour PRN.      Intervention Category Minor Interventions: Agitation / anxiety - evaluation and management  Terez Freimark Eugene 10/06/2015, 7:58 PM

## 2015-10-15 NOTE — H&P (Signed)
Date: 10/12/15               Patient Name:  Julie Rowland MRN: [id number]  DOB: November 22, 1970 Age / Sex: 45 y.o., female   PCP: Dessa Phi, MD         Medical Service: Internal Medicine Teaching Service         Attending Physician: Dr. Debe Coder, MD    First Contact: Dr. Darreld Mclean Pager: [phone]  Second Contact: Dr. Heywood Iles Pager: [phone]       After Hours (After 5p/  First Contact Pager: [phone]  weekends / holidays): Second Contact Pager: [phone]   Chief Complaint: Encephalopathy  History of Present Illness:  Julie Rowland is a 45 year old female with PMH of NASH/alcoholic cirrhosis complicated by portal HTN, esophageal varices, hepatic encephalopathy, coagulopathy, ascites, CKD, HTN, Grand Mal seizures (from alcohol withdrawal, last Sz in 03/2015) who presents with worsening encephalopathy. History is obtained from mother and chart review as patient is unable to communicate. Mother notes that patient has had worsening appetite and function over the last 1-2 weeks. She follows with gastroenterology, Dr. Jonette Eva, who referred the patient for U/S guided therapeutic paracentesis which was completed on 09/21/2015 with removal of 4.7 L of amber colored ascitic fluid. Mother states that patient was communicative this morning, but seemed confused. She has been taking her prescribed medications daily and reported regular bowel movements on lactulose up until yesterday. Patient vomited this morning, including all her morning medications, and became more encephalopathic. Mother called EMS and patient was brought to [facility] ED.  She was recently hospitalized at [facility] from 5/3-5/11 of 2017 for decompensated cirrhosis with worsened encephalopathy and kidney injury. She was continued on lactulose, rifaximin, and spirinolactone. EGD at the time was unsuccessful to evaluate esophageal varices due to failed sedation. This was arranged outpatient and completed on  09/06/15, showing Grade I esophageal varices, portal hypertensive gastropathy, and hemorrhagic duodenopathy.  In the ED, patient was noted to be hypoxic requiring 15 L NRB, in renal failure with BUN 83 and SCr 8.63 (baseline ~2.5), metabolic acidocis (pH 7.046, pCO2 26.3, pO2 94 on NRB), anemic with Hgb 6.9, lactica acidosis (6.22), and had a leukocytosis of 25.0. She was given 2.5 L NS, IV albumin, and started on Vanc/Zosyn. Patient's mother had initially conveyed to the ED physician that they did not want CPR or Intubation. When seen in the ED, patient's mother had expressed that she wanted to be more aggressive in care, including intubation, chest compressions, and admission to the ICU despite uncertainty of improvement and progressive organ failure. PCCM was consulted in light of this change in desire for aggressive measures.    Social History: Unable to obtain from patient due to obtundation. She reportedly stopped alcohol, last use in 2014.  Family History: HTN, DM in mother   Meds: Current Facility-Administered Medications  Medication Dose Route Frequency Provider Last Rate Last Dose  . 0.9 %  sodium chloride infusion  250 mL Intravenous PRN Simonne Martinet, NP      . LORazepam (ATIVAN) injection 2 mg  2 mg Intravenous Q4H PRN Simonne Martinet, NP      . midazolam (VERSED) 2 MG/2ML injection           . morphine 2 MG/ML injection           . morphine 2 MG/ML injection           . morphine 250 mg in sodium  chloride 0.9 % 250 mL (1 mg/mL) infusion  5 mg/hr Intravenous Continuous Simonne MartinetPeter E Babcock, NP       Current Outpatient Prescriptions  Medication Sig Dispense Refill  . ciprofloxacin (CIPRO) 500 MG tablet Take 0.5 tablets (250 mg total) by mouth daily with breakfast. For SBP prophylaxis in setting of cirrhosis 45 tablet 3  . folic acid (FOLVITE) 800 MCG tablet Take 1 tablet (800 mcg total) by mouth daily. 30 tablet 11  . hydrOXYzine (ATARAX/VISTARIL) 25 MG tablet TAKE 1 TABLET BY MOUTH 3  TIMES DAILY AS NEEDED FOR ANXIETY. 90 tablet 2  . lactulose (CHRONULAC) 10 GM/15ML solution Take 45 mLs (30 g total) by mouth 4 (four) times daily. 4185 mL 5  . levETIRAcetam (KEPPRA) 500 MG tablet Take 250 mg (1/2 tablet) twice daily 30 tablet 5  . pantoprazole (PROTONIX) 40 MG tablet TAKE 1 TABLET BY MOUTH 2 TIMES DAILY. 60 tablet 2  . promethazine (PHENERGAN) 12.5 MG tablet 1/2-1 po q8h prn nausea or vomiting 30 tablet 0  . promethazine (PHENERGAN) 25 MG tablet Take 1 tablet (25 mg total) by mouth every 6 (six) hours as needed for nausea. 12 tablet 0  . rifaximin (XIFAXAN) 550 MG TABS tablet Take 1 tablet (550 mg total) by mouth 2 (two) times daily. 180 tablet 3  . sertraline (ZOLOFT) 100 MG tablet Take 1 tablet (100 mg total) by mouth daily. 30 tablet 5  . traMADol (ULTRAM) 50 MG tablet Take 1 tablet (50 mg total) by mouth every 12 (twelve) hours as needed. 60 tablet 0  . zinc sulfate 220 (50 Zn) MG capsule Take 1 capsule (220 mg total) by mouth daily. 60 capsule 11    Allergies: Allergies as of 10-07-2015  . (No Known Allergies)   Past Medical History  Diagnosis Date  . Migraine headache     At age 45  . Hypertension Dx 2003  . Anxiety Dx 2002  . Seizures (HCC) 2014 and 2009  . Portal hypertension (HCC)   . Ascites 10/2014.    Small volume, not enough to tap  . Hepatic encephalopathy (HCC) 02/2015.   Past Surgical History  Procedure Laterality Date  . Colonoscopy  2015    Hyperplastic polyp. Random biopsies negative for collagenous/microscopic colitis. [facility].  . Vein ligation and stripping N/A 07/14/2014    Procedure: EXCISION OF VARICOSE VEINS AT UMBILICUS;  Surgeon: Larina Earthlyodd F Early, MD;  [facility];  Service: Vascular;  Laterality: N/A;  . Umbilical hernia repair N/A 07/14/2014    Procedure: HERNIA REPAIR UMBILICAL ADULT;  Surgeon: Larina Earthlyodd F Early, MD;  [facility];  Service: Vascular;  Laterality: N/A;  . Esophagogastroduodenoscopy (egd) with  propofol N/A 12/07/2014    ZOX:[id number]:moderate portal hypertensive gastropathy/grade 3 varices  . Esophageal banding N/A 12/07/2014    Procedure: ESOPHAGEAL BANDING (4 bands);  Surgeon: West BaliSandi L Fields, MD;  Location: [facility] ORS;  Service: Endoscopy;  Laterality: N/A;  . Esophagogastroduodenoscopy (egd) with propofol N/A 02/08/2015    SLF: 1. 2 columns of grad III varices 2. mild portal hypertensive gastropathy  . Gastric varices banding N/A 02/08/2015    Procedure: GASTRIC VARICES BANDING;  Surgeon: West BaliSandi L Fields, MD;  Location: [facility] ORS;  Service: Endoscopy;  Laterality: N/A;  3 bands  . Esophagogastroduodenoscopy (egd) with propofol N/A 02/23/2015    Procedure: ESOPHAGOGASTRODUODENOSCOPY (EGD) WITH PROPOFOL;  Surgeon: Ruffin FrederickSteven Paul Armbruster, MD;  [facility];  Service: Gastroenterology;  Laterality: N/A;  . Esophagogastroduodenoscopy (egd) with propofol  N/A 09/06/2015    Procedure: ESOPHAGOGASTRODUODENOSCOPY (EGD) WITH PROPOFOL;  Surgeon: West Bali, MD;  Location: [facility] ENDO SUITE;  Service: Endoscopy;  Laterality: N/A;  830   Family History  Problem Relation Age of Onset  . Hypertension Mother   . Cancer Mother   . Diabetes Mother   . Colon cancer Neg Hx   . Liver disease Neg Hx    Social History   Social History  . Marital Status: Legally Separated    Spouse Name: N/A  . Number of Children: N/A  . Years of Education: N/A   Occupational History  . Not on file.   Social History Main Topics  . Smoking status: Former Smoker -- 20 years    Types: Cigarettes    Quit date: 08/02/2014  . Smokeless tobacco: Never Used  . Alcohol Use: 0.0 oz/week    0 Standard drinks or equivalent per week     Comment: Has not had ETOH since 12/05/2012  . Drug Use: No  . Sexual Activity: Yes    Birth Control/ Protection: IUD   Other Topics Concern  . Not on file   Social History Narrative    Review of Systems: Review of Systems  Unable to perform ROS: critical illness     Physical  Exam: Blood pressure 104/59, pulse 96, temperature 96.7 F (35.9 C), temperature source Rectal, resp. rate 18, weight 192 lb 14.4 oz (87.5 kg), SpO2 96 %. Physical Exam  Constitutional:  Critically ill appearing woman, obtunded, on NRB @ 15L O2  HENT:  NRB mask in place, dry blood noted on lips and teeth  Eyes:  Icteric sclera, dilated pupils reactive to light, does not track  Cardiovascular:  Tachycardic, regular rhythm, difficult to auscultate heart sounds over coarse breath sounds. DP and Radial pulses +2 bilaterally.  Pulmonary/Chest:  Agonal breathing, loud rhonchorous breath sounds bilateral anterior lung fields  Abdominal:  Distended abdomen, slightly tense, some ascites noted, no spider angioma or caput medusae  Musculoskeletal:  Moves all extremities spontaneously, no pitting edema  Neurological:  Obtunded, opens eyes to voice, not following commands  Skin:  Skin is warm at distal extremities, jaundiced skin     Lab results: Basic Metabolic Panel:  Recent Labs  16/10/96 0952  NA 133*  K 3.7  CL 103  CO2 8*  GLUCOSE 99  BUN 83*  CREATININE 8.63*  CALCIUM 8.3*   Liver Function Tests:  Recent Labs  10-19-2015 0952  AST 63*  ALT 35  ALKPHOS 101  BILITOT 5.6*  PROT 6.8  ALBUMIN 2.4*   No results for input(s): LIPASE, AMYLASE in the last 72 hours.  Recent Labs  2015-10-19 1000  AMMONIA 130*   CBC:  Recent Labs  Oct 19, 2015 0952  WBC 25.0*  HGB 6.9*  HCT 20.3*  MCV 84.9  PLT 100*   Cardiac Enzymes: No results for input(s): CKTOTAL, CKMB, CKMBINDEX, TROPONINI in the last 72 hours. BNP: No results for input(s): PROBNP in the last 72 hours. D-Dimer: No results for input(s): DDIMER in the last 72 hours. CBG:  Recent Labs  Oct 19, 2015 0946  GLUCAP 93   Hemoglobin A1C: No results for input(s): HGBA1C in the last 72 hours. Fasting Lipid Panel: No results for input(s): CHOL, HDL, LDLCALC, TRIG, CHOLHDL, LDLDIRECT in the last 72 hours. Thyroid  Function Tests: No results for input(s): TSH, T4TOTAL, FREET4, T3FREE, THYROIDAB in the last 72 hours. Anemia Panel: No results for input(s): VITAMINB12, FOLATE, FERRITIN, TIBC, IRON, RETICCTPCT in the  last 72 hours. Coagulation:  Recent Labs  September 27, 2015 1250  LABPROT 28.2*  INR 2.69*   Urine Drug Screen: Drugs of Abuse  No results found for: LABOPIA, COCAINSCRNUR, LABBENZ, AMPHETMU, THCU, LABBARB  Alcohol Level: No results for input(s): ETH in the last 72 hours. Urinalysis:  Recent Labs  09/27/2015 1550  COLORURINE AMBER*  LABSPEC 1.017  PHURINE 5.0  GLUCOSEU NEGATIVE  HGBUR MODERATE*  BILIRUBINUR NEGATIVE  KETONESUR NEGATIVE  PROTEINUR NEGATIVE  NITRITE NEGATIVE  LEUKOCYTESUR SMALL*     Imaging results:  US Abdomen Limited  09-27-2015  CLINICAL DATA:  Ultrasound to localize ascites for planned paracentesis EXAM: LIMITED ABDOMEN ULTRASOUND FOR ASCITES TECHNIQUE: Limited ultrasound survey for ascites was performed in all four abdominal quadrants. COMPARISON:  Abdominal ultrasound of September 21, 2015 FINDINGS: The patient was unable to remain motionless for the procedure. There is a moderate amount of ascites present in the left lower quadrant with smaller amounts in the rib right lower quadrant. A moderate amount of ascites is noted around the liver. IMPRESSION: There is ascites but the patient was unable to tolerate the positioning and the necessity of remaining motionless for the procedure. Electronically Signed   By: David  [place] M.D.   On: 09/27/15 15:59   Dg Chest Portable 1 View  Sep 27, 2015  CLINICAL DATA:  Hypoxia with shortness of breath. Encephalopathy. Chronic renal failure. EXAM: PORTABLE CHEST 1 VIEW COMPARISON:  March 22, 2015 FINDINGS: There is alveolar opacity throughout the lungs bilaterally. Heart is mildly enlarged with pulmonary venous hypertension. No adenopathy evident. IMPRESSION: Findings felt to represent congestive heart failure, although given the overall  clinical history, there may be noncardiogenic alveolar pulmonary edema. Diffuse alveolar opacity is likely due to alveolar edema. It should be noted that pneumonia superimposed cannot be excluded. A degree of ARDS superimposed is also possible. More than one of these entities may exist concurrently. Electronically Signed   By: Bretta Bang III M.D.   On: 09-27-15 10:30    Other results: EKG: n/a.  Assessment & Plan by Problem:  MODS 2/2 decompensated cirrhosis with encephalopathy: Patient encephalopathic on presentation with hepatic, renal, and respiratory failure, likely septic with possible SBP, hepatorenal syndrome, and progression to ARDS. MELD score is 38 on admission. As above, patient's mother had asked to pursue aggressive measures when seen by [place] in the ED, which was a reversal of her decision with the ED physician. PCCM was consulted to evaluate for possible admission to the ICU. They discussed the patient's chance for survival with aggressive measure and high likelihood for recurrence if she survives this episode. Per their conversation, family understands the terminal nature of her disease and have agreed to transition patient to comfort care. -PCCM to admit patient to medsurg tonight, IMTS to take over care morning of 09/24/15 -Greatly appreciate PCCM assistance -Continue palliative care -DNR -Continue morphine gtt     Dispo: End of life Palliative/Comfort care inpatient.   Signed: Darreld Mclean, MD 09-27-2015, 5:50 PM

## 2015-10-15 NOTE — ED Notes (Signed)
Patient mouth breathing, placed on NRB at 15L

## 2015-10-15 NOTE — ED Notes (Signed)
Dr. Juleen ChinaKohut at bedside, patient unable to lie flat on her back at this time. Dr. Juleen ChinaKohut advised to hold off on sending patient to US Paracentesis for now.

## 2015-10-15 NOTE — ED Provider Notes (Signed)
CSN: [id number]     Arrival date & time 09/15/2015  [id number] History   First MD Initiated Contact with Patient 06-14-15 [phone]     Chief Complaint  Patient presents with  . Altered Mental Status     (Consider location/radiation/quality/duration/timing/severity/associated sxs/prior Treatment) HPI   45 year old female with increasing encephalopathy. History is primarily from mother and review of records.  She has a past history of advanced cirrhosis secondary to alcohol abuse. Last drink over a year ago. Mother reports that patient has progressively declined over the past several days to weeks. She has become increasingly confused. She is vomiting intermittently. She is taking her medications consistently over the past couple days. She has not been eating or drinking much at all. She is complaining some abdominal pain, but this is not a acute complaint. She's been previously evaluated by Dr. Jonette EvaSandi Fields, gastroenterology, and [facility]. Her mother reports that she was recently evaluated there for possible consideration for liver transplant. She is status post paracentesis 2 days ago.  Past Medical History  Diagnosis Date  . Migraine headache     At age 45  . Hypertension Dx 2003  . Anxiety Dx 2002  . Seizures (HCC) 2014 and 2009  . Portal hypertension (HCC)   . Ascites 10/2014.    Small volume, not enough to tap  . Hepatic encephalopathy (HCC) 02/2015.   Past Surgical History  Procedure Laterality Date  . Colonoscopy  2015    Hyperplastic polyp. Random biopsies negative for collagenous/microscopic colitis. Performed at [facility].  . Vein ligation and stripping N/A 07/14/2014    Procedure: EXCISION OF VARICOSE VEINS AT UMBILICUS;  Surgeon: Larina Earthlyodd F Early, MD;  [facility];  Service: Vascular;  Laterality: N/A;  . Umbilical hernia repair N/A 07/14/2014    Procedure: HERNIA REPAIR UMBILICAL ADULT;  Surgeon: Larina Earthlyodd F Early, MD;  [facility];  Service: Vascular;  Laterality: N/A;   . Esophagogastroduodenoscopy (egd) with propofol N/A 12/07/2014    YNW:GNFAOZHYSLF:moderate portal hypertensive gastropathy/grade 3 varices  . Esophageal banding N/A 12/07/2014    Procedure: ESOPHAGEAL BANDING (4 bands);  Surgeon: West BaliSandi L Fields, MD;  Location: [facility] ORS;  Service: Endoscopy;  Laterality: N/A;  . Esophagogastroduodenoscopy (egd) with propofol N/A 02/08/2015    SLF: 1. 2 columns of grad III varices 2. mild portal hypertensive gastropathy  . Gastric varices banding N/A 02/08/2015    Procedure: GASTRIC VARICES BANDING;  Surgeon: West BaliSandi L Fields, MD;  Location: [facility] ORS;  Service: Endoscopy;  Laterality: N/A;  3 bands  . Esophagogastroduodenoscopy (egd) with propofol N/A 02/23/2015    Procedure: ESOPHAGOGASTRODUODENOSCOPY (EGD) WITH PROPOFOL;  Surgeon: Ruffin FrederickSteven Paul Armbruster, MD;  [facility];  Service: Gastroenterology;  Laterality: N/A;  . Esophagogastroduodenoscopy (egd) with propofol N/A 09/06/2015    Procedure: ESOPHAGOGASTRODUODENOSCOPY (EGD) WITH PROPOFOL;  Surgeon: West BaliSandi L Fields, MD;  Location: [facility] ENDO SUITE;  Service: Endoscopy;  Laterality: N/A;  830   Family History  Problem Relation Age of Onset  . Hypertension Mother   . Cancer Mother   . Diabetes Mother   . Colon cancer Neg Hx   . Liver disease Neg Hx    Social History  Substance Use Topics  . Smoking status: Former Smoker -- 20 years    Types: Cigarettes    Quit date: 08/02/2014  . Smokeless tobacco: Never Used  . Alcohol Use: 0.0 oz/week    0 Standard drinks or equivalent per week     Comment: Has not had  ETOH since 12/05/2012   OB History    No data available     Review of Systems  Level V caveat because of encephalopathy.  Allergies  Review of patient's allergies indicates no known allergies.  Home Medications   Prior to Admission medications   Medication Sig Start Date End Date Taking? Authorizing Provider  ciprofloxacin (CIPRO) 500 MG tablet Take 0.5 tablets (250 mg total) by mouth daily with  breakfast. For SBP prophylaxis in setting of cirrhosis 09/02/15   Dessa Phi, MD  folic acid (FOLVITE) 800 MCG tablet Take 1 tablet (800 mcg total) by mouth daily. 09/02/15   [name], MD  hydrOXYzine (ATARAX/VISTARIL) 25 MG tablet TAKE 1 TABLET BY MOUTH 3 TIMES DAILY AS NEEDED FOR ANXIETY. 09/15/15   [name], MD  lactulose (CHRONULAC) 10 GM/15ML solution Take 45 mLs (30 g total) by mouth 4 (four) times daily. 04/04/15   [name], MD  levETIRAcetam (KEPPRA) 500 MG tablet Take 250 mg (1/2 tablet) twice daily 08/12/15   [name], MD  pantoprazole (PROTONIX) 40 MG tablet TAKE 1 TABLET BY MOUTH 2 TIMES DAILY. 09/14/15   Dessa Phi, MD  promethazine (PHENERGAN) 12.5 MG tablet 1/2-1 po q8h prn nausea or vomiting 09/19/15   West Bali, MD  promethazine (PHENERGAN) 25 MG tablet Take 1 tablet (25 mg total) by mouth every 6 (six) hours as needed for nausea. 05/08/11 09/18/16  Azalia Bilis, MD  rifaximin (XIFAXAN) 550 MG TABS tablet Take 1 tablet (550 mg total) by mouth 2 (two) times daily. 08/16/15   [name], MD  sertraline (ZOLOFT) 100 MG tablet Take 1 tablet (100 mg total) by mouth daily. 05/17/15   [name], MD  traMADol (ULTRAM) 50 MG tablet Take 1 tablet (50 mg total) by mouth every 12 (twelve) hours as needed. 09/02/15   [name], MD  zinc sulfate 220 (50 Zn) MG capsule Take 1 capsule (220 mg total) by mouth daily. 09/02/15   [name], MD   BP 133/68 mmHg  Pulse 111  Temp(Src) 96.7 F (35.9 C) (Rectal)  Resp 18  Wt 192 lb 14.4 oz (87.5 kg)  SpO2 95% Physical Exam  HENT:  Head: Normocephalic and atraumatic.  Eyes: Right eye exhibits no discharge. Left eye exhibits no discharge. Scleral icterus is present.  Neck: Neck supple.  Cardiovascular: Regular rhythm and normal heart sounds.  Exam reveals no gallop and no friction rub.   No murmur heard. Mild tachcyardia  Pulmonary/Chest: No respiratory distress. She has rales.  Abdominal: Soft.  She exhibits distension. There is tenderness.  Abdomen is distended, but soft. Diffuse tenderness. This does not seem to particularly localized. Clean/dry bandage to left abdomen most likely from recent paracentesis.  Musculoskeletal: She exhibits no edema or tenderness.  Neurological:  Does respond to questions but answers are confused. Follows simple commands.   Skin: Skin is warm and dry. She is not diaphoretic.  Scattered angiomas to chest   Nursing note and vitals reviewed.   ED Course  Procedures (including critical care time)  CRITICAL CARE Performed by: Raeford Razor Total critical care time: 35 minutes Critical care time was exclusive of separately billable procedures and treating other patients. Critical care was necessary to treat or prevent imminent or life-threatening deterioration. Critical care was time spent personally by me on the following activities: development of treatment plan with patient and/or surrogate as well as nursing, discussions with consultants, evaluation of patient's response to treatment, examination of patient, obtaining history from patient  or surrogate, ordering and performing treatments and interventions, ordering and review of laboratory studies, ordering and review of radiographic studies, pulse oximetry and re-evaluation of patient's condition.  Labs Review Labs Reviewed  COMPREHENSIVE METABOLIC PANEL - Abnormal; Notable for the following:    Sodium 133 (*)    CO2 8 (*)    BUN 83 (*)    Creatinine, Ser 8.63 (*)    Calcium 8.3 (*)    Albumin 2.4 (*)    AST 63 (*)    Total Bilirubin 5.6 (*)    GFR calc non Af Amer 5 (*)    GFR calc Af Amer 6 (*)    Anion gap 22 (*)    All other components within normal limits  CBC - Abnormal; Notable for the following:    WBC 25.0 (*)    RBC 2.39 (*)    Hemoglobin 6.9 (*)    HCT 20.3 (*)    RDW 17.7 (*)    Platelets 100 (*)    All other components within normal limits  AMMONIA - Abnormal; Notable for  the following:    Ammonia 130 (*)    All other components within normal limits  I-STAT CG4 LACTIC ACID, ED - Abnormal; Notable for the following:    Lactic Acid, Venous 6.22 (*)    All other components within normal limits  I-STAT ARTERIAL BLOOD GAS, ED - Abnormal; Notable for the following:    pH, Arterial 7.046 (*)    pCO2 arterial 26.3 (*)    Bicarbonate 7.2 (*)    Acid-base deficit 22.0 (*)    All other components within normal limits  CULTURE, BLOOD (ROUTINE X 2)  CULTURE, BLOOD (ROUTINE X 2)  BODY FLUID CULTURE  URINALYSIS, ROUTINE W REFLEX MICROSCOPIC (NOT AT [facility])  LACTATE DEHYDROGENASE, BODY FLUID  GLUCOSE, PERITONEAL FLUID  PROTEIN, BODY FLUID  ALBUMIN, FLUID  URINE RAPID DRUG SCREEN, HOSP PERFORMED  CBG MONITORING, ED  CBG MONITORING, ED  TYPE AND SCREEN    Imaging Review Dg Chest Portable 1 View  2015/10/06  CLINICAL DATA:  Hypoxia with shortness of breath. Encephalopathy. Chronic renal failure. EXAM: PORTABLE CHEST 1 VIEW COMPARISON:  March 22, 2015 FINDINGS: There is alveolar opacity throughout the lungs bilaterally. Heart is mildly enlarged with pulmonary venous hypertension. No adenopathy evident. IMPRESSION: Findings felt to represent congestive heart failure, although given the overall clinical history, there may be noncardiogenic alveolar pulmonary edema. Diffuse alveolar opacity is likely due to alveolar edema. It should be noted that pneumonia superimposed cannot be excluded. A degree of ARDS superimposed is also possible. More than one of these entities may exist concurrently. Electronically Signed   By: Bretta Bang III M.D.   On: 06-Oct-2015 10:30   I have personally reviewed and evaluated these images and lab results as part of my medical decision-making.   EKG Interpretation None      MDM   Final diagnoses:  Abdominal pain  Encephalopathy  Severe sepsis (HCC)  Renal failure  Anemia, unspecified anemia type  Metabolic acidosis  HCAP  (healthcare-associated pneumonia)    45yF with encephalopathy. Sepsis? Leukocytosis/tachcyardia. Pneumonia? CXR with diffuse alveolar opacities. Edema/infectious/ARDS. SBP? Tender on exam. Mother reports has baseline abdominal pain. Unclear if acutely changes. Empiric vanc/zosyn. Should cover for possible HCAP and SBP. Will see if can get paracentesis. Worsening renal function. Dehydration? Mother reports recent vomiting and decreasing PO intake. She thinks she has been urinating. Hepatorenal syndrome? IVF bolus for possible sepsis and volume depletion. Albumin  may be beneficial in case of either SBP or hepatorenal syndrome. Pt is very sick with multiorgan dysfunction. Addressed Code Status with mother. She is a retired [employer] fund[employer].  She reports they have had previous discussions and "she would not want to live like this." Not ready for comfort measures. Would like medical care but not CPR or intubation.    Raeford Razor, MD 10/14/15 1228

## 2015-10-15 NOTE — Telephone Encounter (Signed)
Pt's mother called this morning and wanted to let SF know that she had called EMS and patient is going to the hospital. I told her that SF was not in the office this week, but she wanted SF to know.

## 2015-10-15 NOTE — Progress Notes (Signed)
Patient ID: Julie MerlMichelle L Rowland, female   DOB: 01/15/1971, 45 y.o.   MRN: [id number]   US paracentesis requested  Pt writhing in stretcher/bed Not responding verbally  Able to perform Limited US Abd +ascites noted B lower abd  Cannot safely perform procedure Pt unable to lay still  Deferred for now Pt is being admitted Consider re attempt (maybe tomorrow) when pt may have medication for agitation on board

## 2015-10-15 DEATH — deceased

## 2015-12-09 ENCOUNTER — Ambulatory Visit: Payer: No Typology Code available for payment source | Admitting: Nurse Practitioner

## 2016-01-24 ENCOUNTER — Telehealth: Payer: Self-pay | Admitting: Gastroenterology

## 2016-07-19 IMAGING — DX DG CHEST 2V
2 series · 2 of 2 positions shown · non-contrast
Comparison: 10/06/2013

CLINICAL DATA: Shortness of breath, bilateral leg swelling

EXAM:
CHEST  2 VIEW

[chest lat]
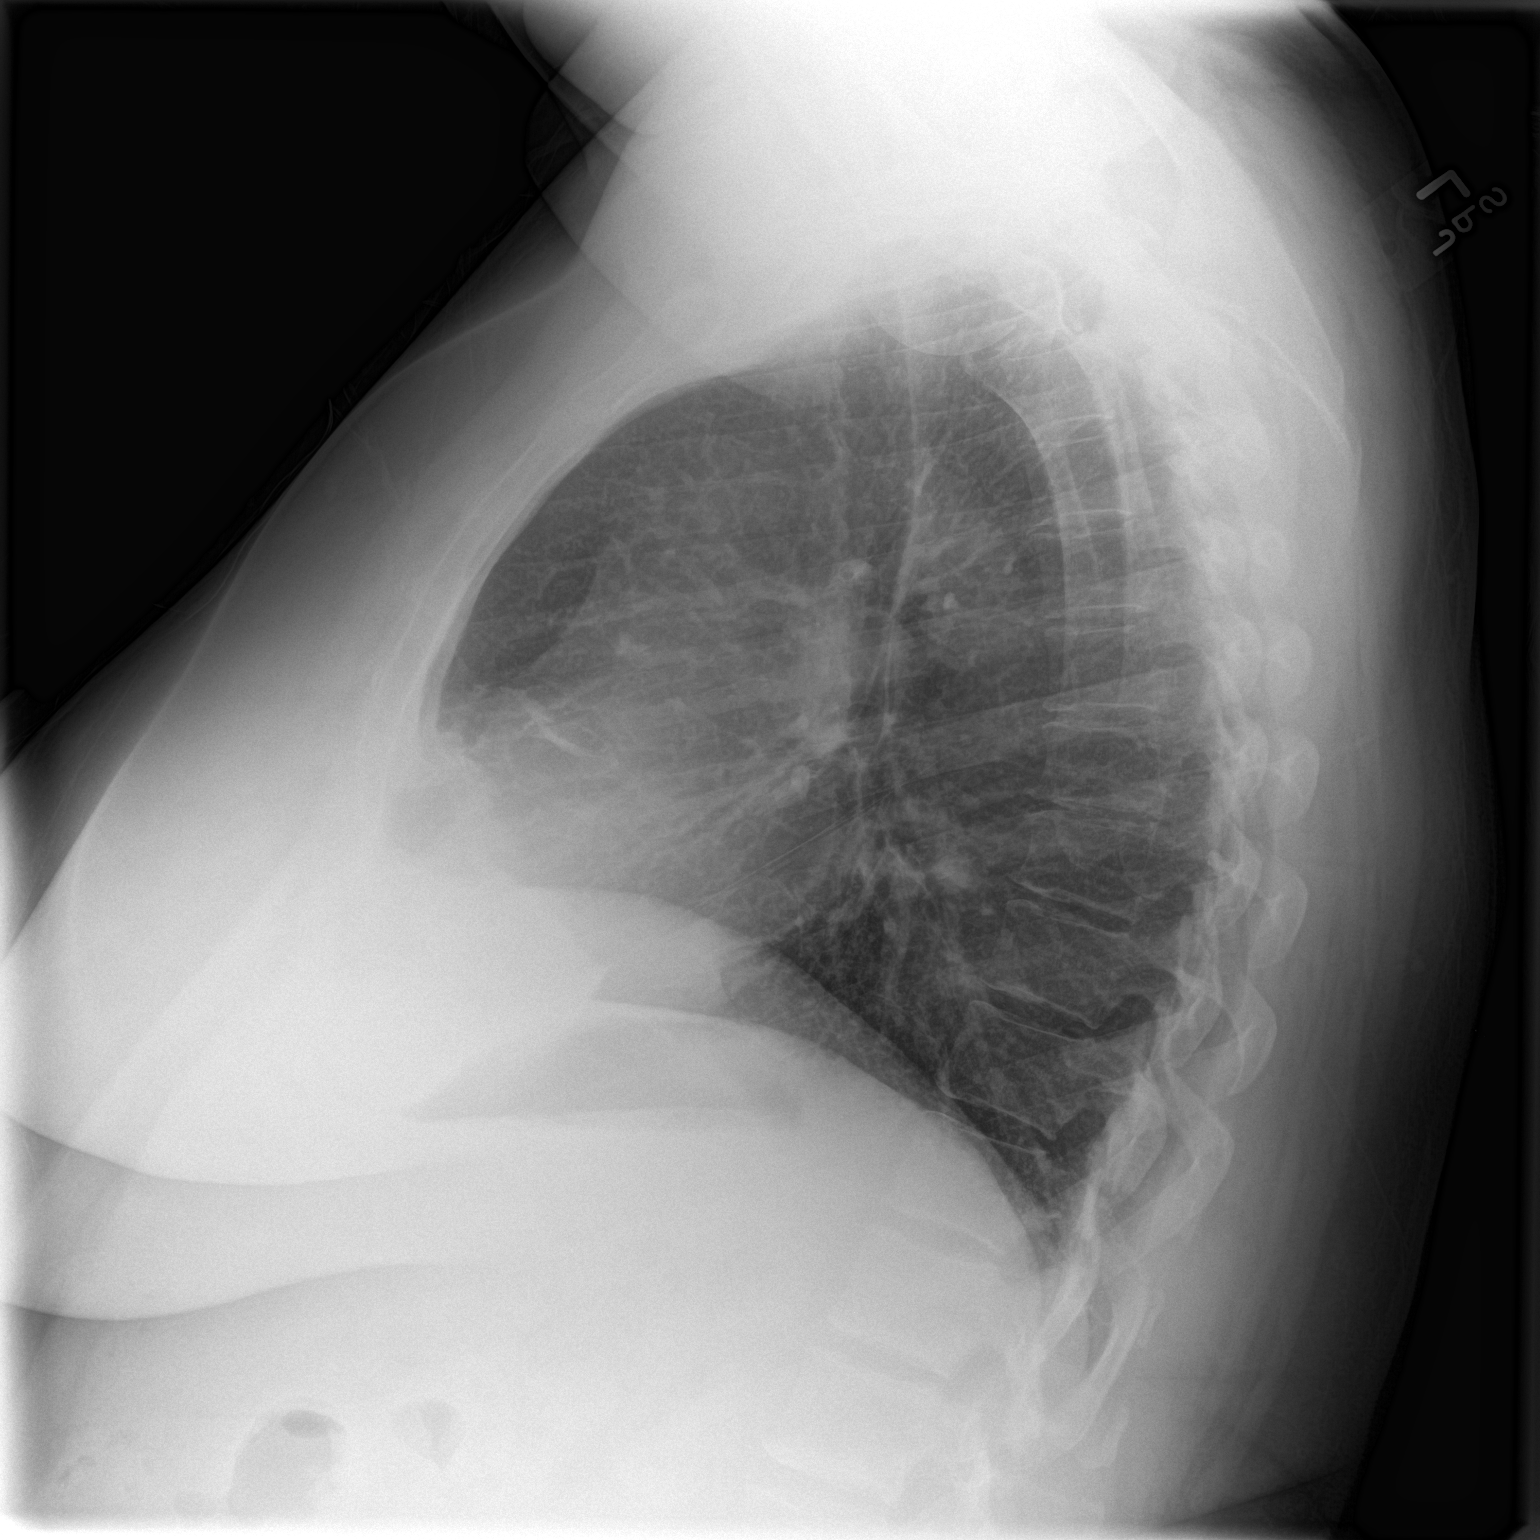

[chest pa]
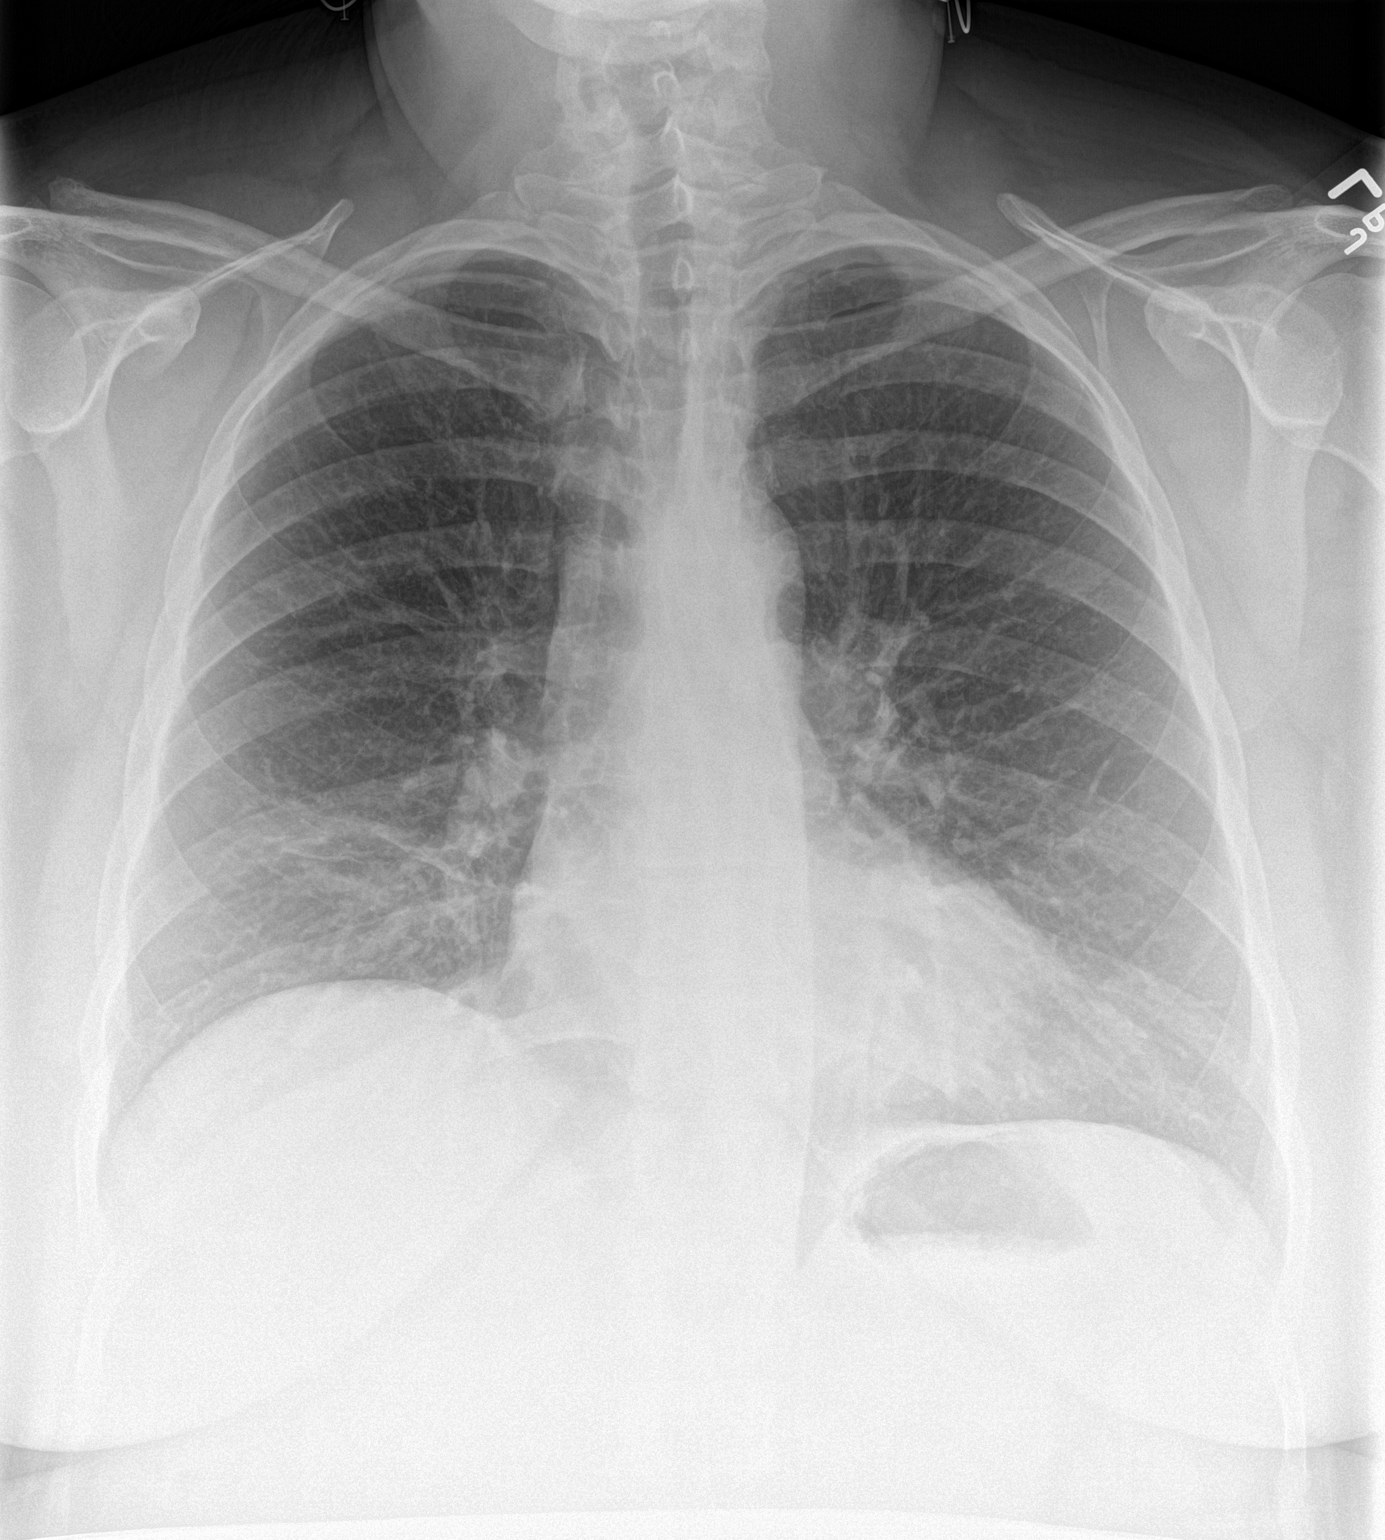

[2 of 2 positions shown; findings below may reference images not displayed]

FINDINGS: Linear scarring or atelectasis in the right middle lobe. Left lung
is clear. Heart is normal size. No effusions. No acute bony
abnormality.
IMPRESSION: Right middle lobe scarring or atelectasis.

## 2016-12-17 IMAGING — MR MR ABDOMEN WO/W CM
6 of 17 series · 19 of 48 positions shown · IV contrast (10ml Eovist)
Comparison: CT 06/30/2014 and 05/08/2011.

CLINICAL DATA: Liver disease with varices and previous episode of
bleeding from the umbilicus. Hepatic heterogeneity on CT. No history
of malignancy. Subsequent encounter.

EXAM:
MRI ABDOMEN WITHOUT AND WITH CONTRAST
TECHNIQUE: Multiplanar multisequence MR imaging of the abdomen was performed
both before and after the administration of intravenous contrast.
CONTRAST:  10 ml Eovist

[Series 4: T2 · coronal · 5.0mm · 1.41mm/px · 2 of 40 slices shown]
[im 1/40]
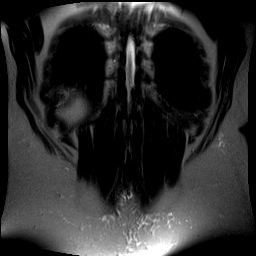
[im 40/40]
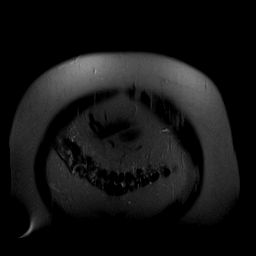

[Series 5: axial in out · axial · 6.0mm · 0.70mm/px · z∈[-73,+147]mm · 4 of 72 slices shown]
[im 1/72]
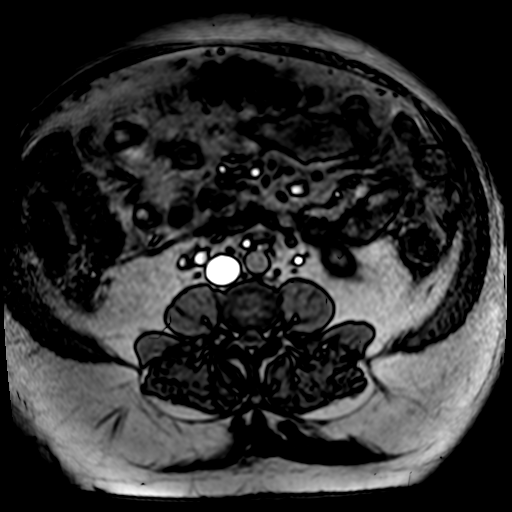
[im 24/72]
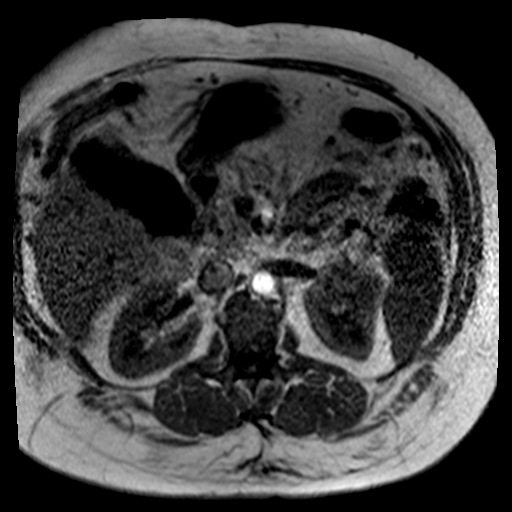
[im 48/72]
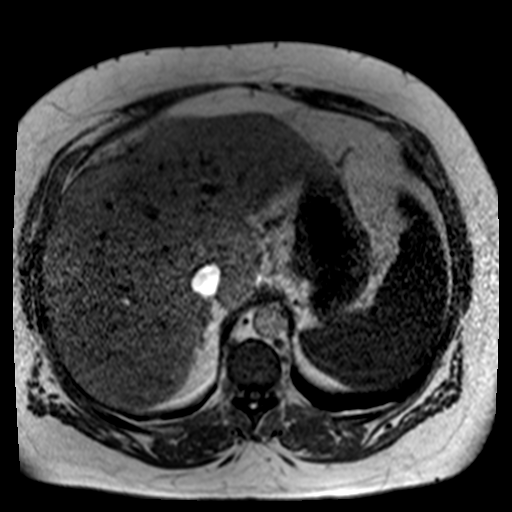
[im 72/72]
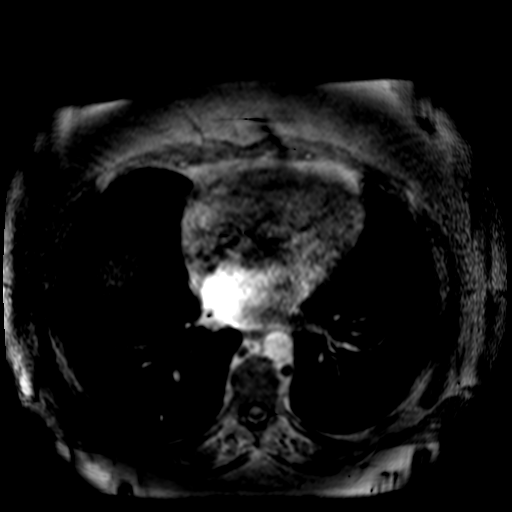

[Series 6: T1 dynamic · axial · non-contrast · 2.5mm · 0.72mm/px · z∈[-82,+156]mm · 4 of 96 slices shown]
[im 1/96]
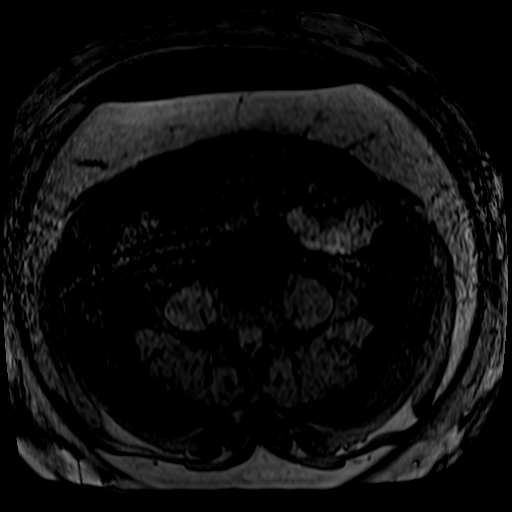
[im 32/96]
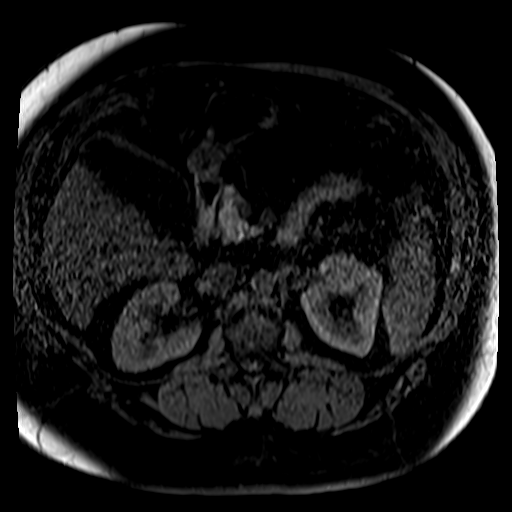
[im 64/96]
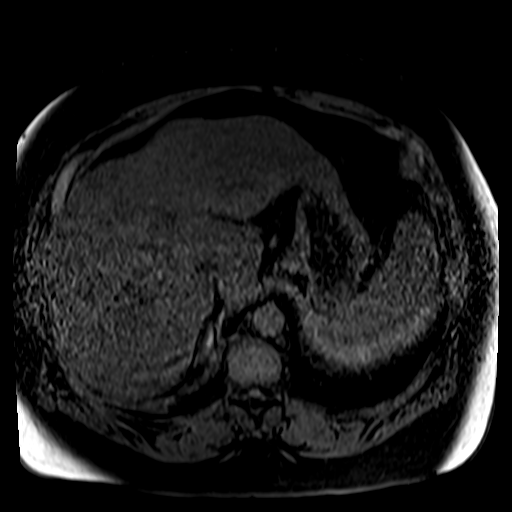
[im 96/96]
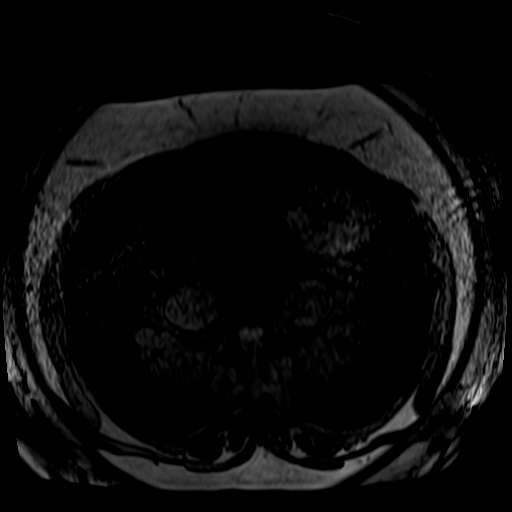

[Series 7: post immed · axial · 2.5mm · 0.72mm/px · z∈[-82,+156]mm · 3 of 96 slices shown]
[im 1/96]
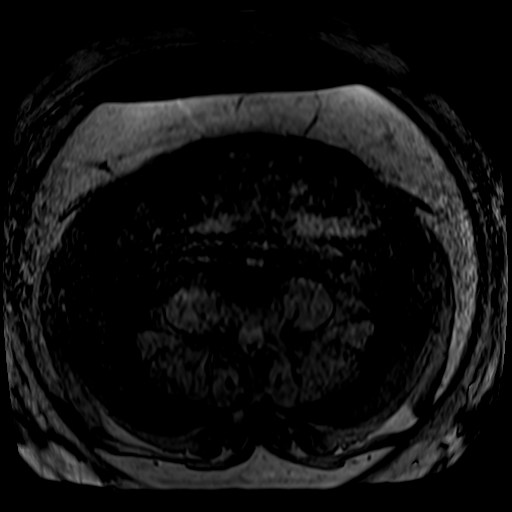
[im 48/96]
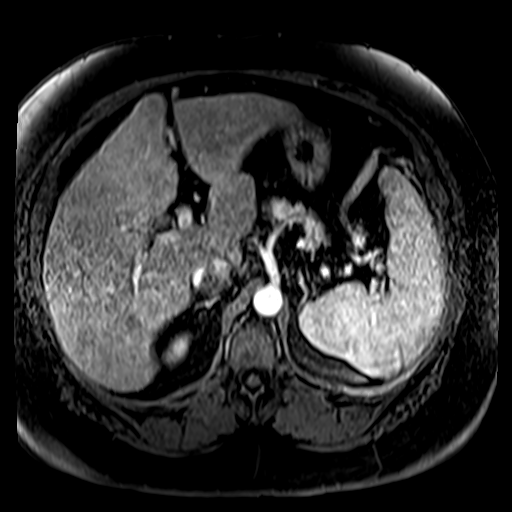
[im 96/96]
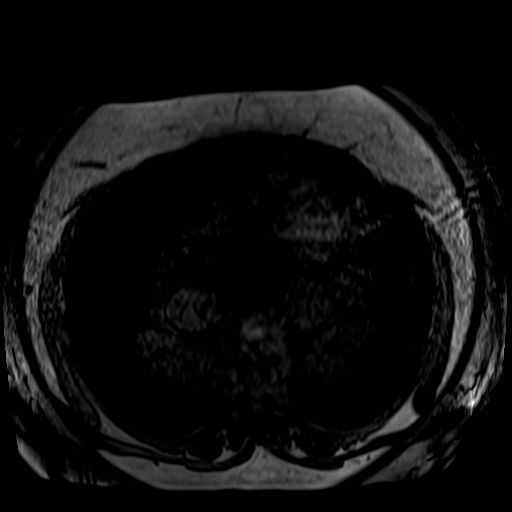

[Series 8: post immed_sub · axial · 2.5mm · 0.72mm/px · z∈[-82,+156]mm · 3 of 96 slices shown]
[im 1/96]
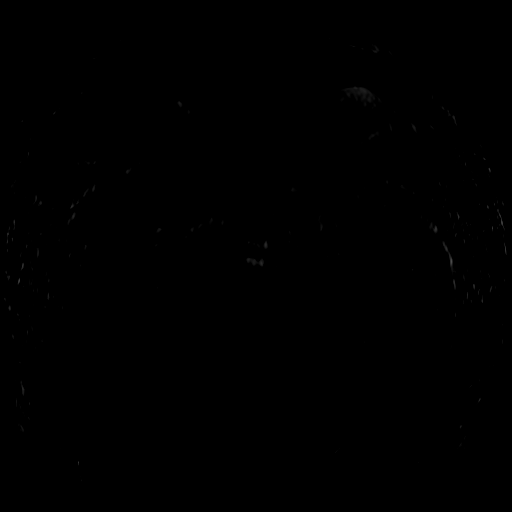
[im 48/96]
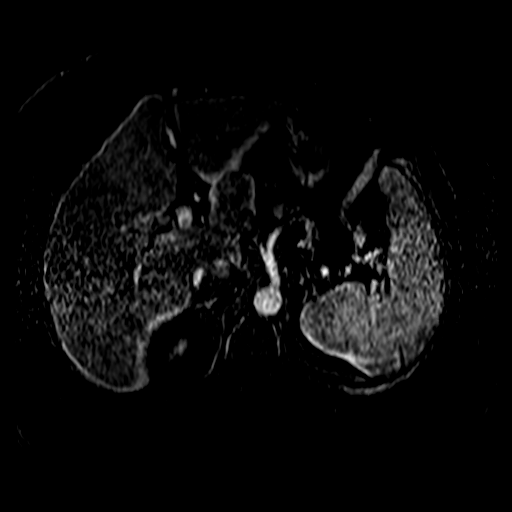
[im 96/96]
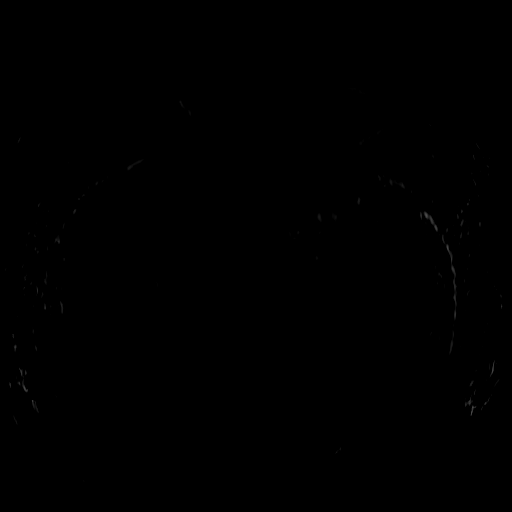

[Series 9: post 45 sec · axial · 2.5mm · 0.72mm/px · z∈[-82,+156]mm · 3 of 96 slices shown]
[im 1/96]
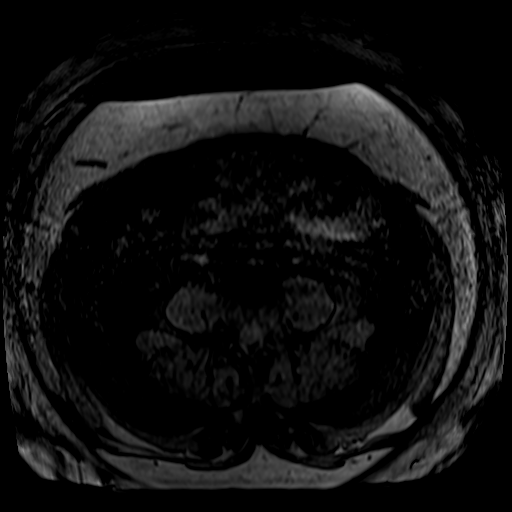
[im 48/96]
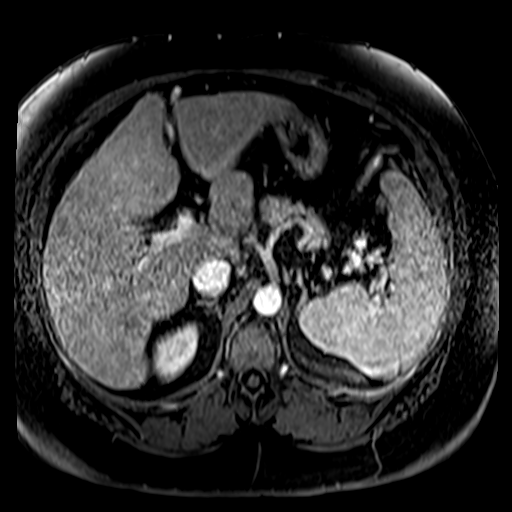
[im 96/96]
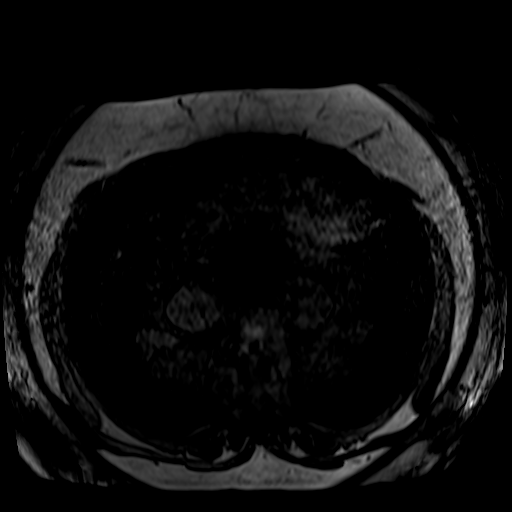

[19 of 48 positions shown; findings below may reference images not displayed]

FINDINGS: Lower chest:  Unremarkable.

Hepatobiliary: The liver is enlarged with diffuse mildly
heterogeneous hepatic steatosis manifesting as marked loss of signal
on opposed phase gradient echo images. There is minimal contour
irregularity of the liver with relative enlargement of the caudate
lobe. There is a 7 mm subcapsular lesion in the lateral segment of
the left hepatic lobe which follows fat signal, demonstrating loss
of signal on the opposed phase images and no enhancement following
contrast. There is also focal fat in the caudate lobe. No suspicious
hepatic lesions or abnormal enhancement identified within the liver.
The gallbladder is mildly distended without wall thickening or
stones. There is mild pericholecystic fluid. No biliary dilatation.
There is minimal excretion of the Eovist on the delayed phase
images, attributed to hepatic dysfunction.

Pancreas: Unremarkable. No pancreatic ductal dilatation or
surrounding inflammatory changes.

Spleen: Normal in size without focal abnormality.

Adrenals/Urinary Tract: Both adrenal glands appear normal.The
kidneys appear normal without evidence of urinary tract calculus or
hydronephrosis. Bladder not imaged.

Stomach/Bowel: No evidence of bowel wall thickening, distention or
surrounding inflammatory change.

Vascular/Lymphatic: Stable small lymph nodes within the porta
hepatis, likely reactive. No retroperitoneal adenopathy. More
completely visualized on prior CT are multiple anterior abdominal
and retroperitoneal varices. Extension into the umbilicus was noted
on prior CT, not imaged on this examination. The portal, superior
mesenteric and splenic veins are patent.

Other: Small amount of ascites with diffuse mesenteric edema. No
focal extraluminal fluid collection.

Musculoskeletal: No acute or significant osseous findings.
IMPRESSION: 1. Hepatomegaly with mildly heterogeneous hepatic steatosis and
small lipomas or focal fat. No suspicious hepatic findings.
2. Abdominal varices, better seen on prior CT.
3. Mild ascites.

## 2017-10-01 IMAGING — US US PARACENTESIS
1 series · 2 of 2 positions shown · non-contrast
Comparison: none

INDICATION: Cirrhosis, portal hypertension, ascites

[Series 1: us paracentesis · 0.19mm/px · 2 of 2 slices shown]
[im 1/2]
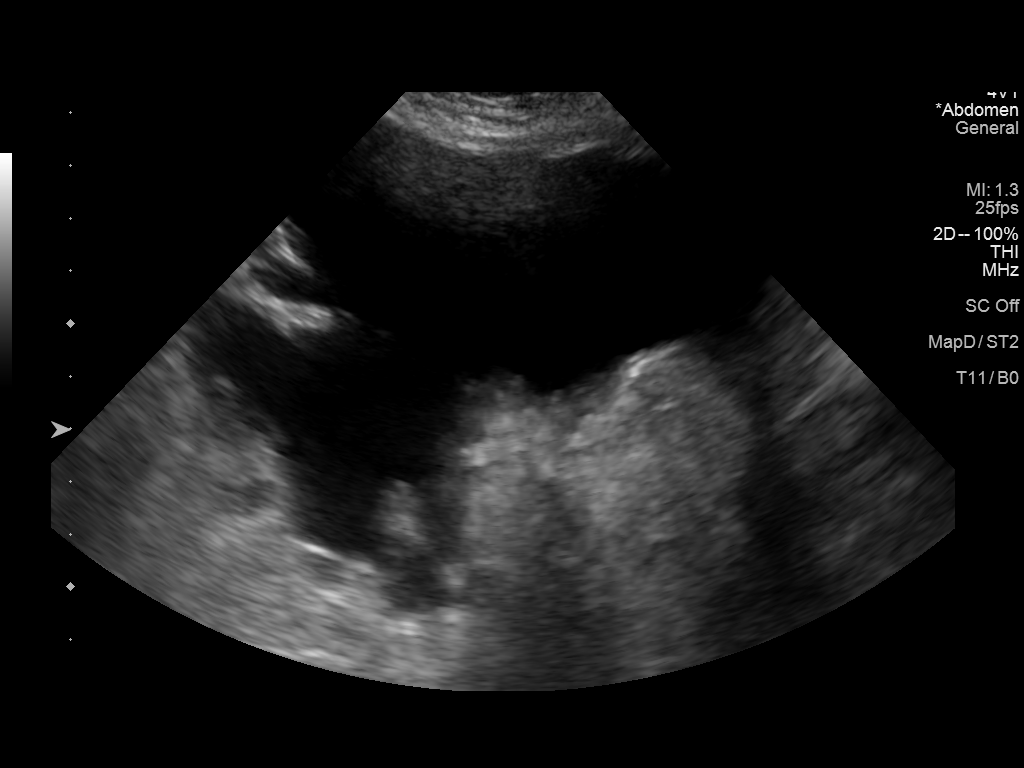
[im 2/2]
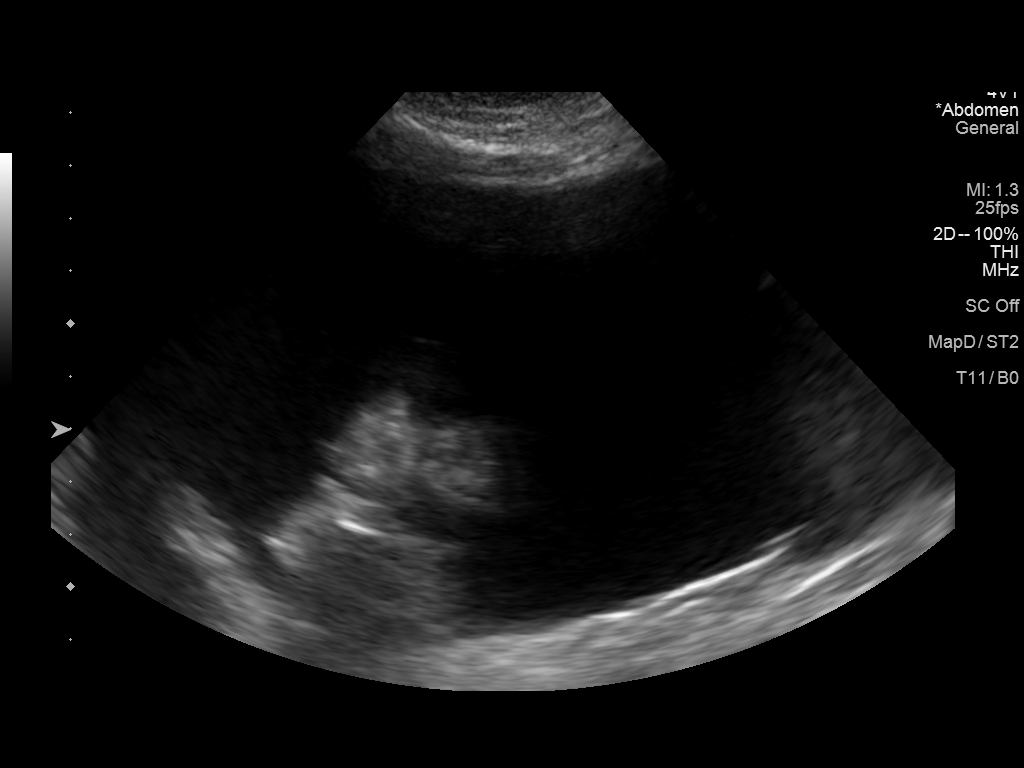

[2 of 2 positions shown; findings below may reference images not displayed]

EXAM:
ULTRASOUND GUIDED  PARACENTESIS

MEDICATIONS:
None.

COMPLICATIONS:
None immediate.

PROCEDURE:
Procedure, benefits, and risks of procedure were discussed with
patient.

Written informed consent for procedure was obtained.

Time out protocol followed.

Adequate collection of ascites localized by ultrasound in LEFT lower
quadrant.

Skin prepped and draped in usual sterile fashion.

Skin and soft tissues anesthetized with 10 mL of 1% lidocaine.

5 French Yueh catheter placed into peritoneal cavity.

2555 mL of amber colored ascites aspirated by vacuum bottle suction.

Procedure tolerated well by patient without immediate complication.
FINDINGS: As above
IMPRESSION: Successful ultrasound-guided paracentesis yielding 4 liters of
peritoneal fluid.
